# Patient Record
Sex: Male | Born: 1955 | Race: White | Hispanic: No | Marital: Single | Smoking: Current every day smoker
Health system: Southern US, Community
[De-identification: ages and names within clinical notes are randomized; demographics above are authoritative.]

## PROBLEM LIST (undated history)

## (undated) DIAGNOSIS — I10 Essential (primary) hypertension: Secondary | ICD-10-CM

## (undated) DIAGNOSIS — E559 Vitamin D deficiency, unspecified: Secondary | ICD-10-CM

## (undated) DIAGNOSIS — L8990 Pressure ulcer of unspecified site, unspecified stage: Secondary | ICD-10-CM

## (undated) DIAGNOSIS — J869 Pyothorax without fistula: Secondary | ICD-10-CM

## (undated) DIAGNOSIS — J449 Chronic obstructive pulmonary disease, unspecified: Secondary | ICD-10-CM

## (undated) DIAGNOSIS — M545 Low back pain, unspecified: Secondary | ICD-10-CM

## (undated) DIAGNOSIS — E7800 Pure hypercholesterolemia, unspecified: Secondary | ICD-10-CM

## (undated) DIAGNOSIS — Z79891 Long term (current) use of opiate analgesic: Secondary | ICD-10-CM

## (undated) DIAGNOSIS — E0590 Thyrotoxicosis, unspecified without thyrotoxic crisis or storm: Secondary | ICD-10-CM

## (undated) DIAGNOSIS — E785 Hyperlipidemia, unspecified: Secondary | ICD-10-CM

## (undated) HISTORY — PX: KNEE ARTHROSCOPY: SUR90

## (undated) HISTORY — DX: Hyperlipidemia, unspecified: E78.5

## (undated) HISTORY — PX: HERNIA REPAIR: SHX51

## (undated) HISTORY — DX: Low back pain: M54.5

## (undated) HISTORY — DX: Essential (primary) hypertension: I10

## (undated) HISTORY — DX: Vitamin D deficiency, unspecified: E55.9

## (undated) HISTORY — DX: Thyrotoxicosis, unspecified without thyrotoxic crisis or storm: E05.90

## (undated) HISTORY — DX: Pure hypercholesterolemia, unspecified: E78.00

## (undated) HISTORY — DX: Low back pain, unspecified: M54.50

## (undated) HISTORY — PX: NECK SURGERY: SHX720

## (undated) HISTORY — DX: Long term (current) use of opiate analgesic: Z79.891

---
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
## 2003-12-16 ENCOUNTER — Emergency Department (HOSPITAL_COMMUNITY): Admission: EM | Admit: 2003-12-16 | Discharge: 2003-12-16 | Payer: Self-pay | Admitting: Emergency Medicine

## 2005-07-17 ENCOUNTER — Ambulatory Visit (HOSPITAL_COMMUNITY): Admission: RE | Admit: 2005-07-17 | Discharge: 2005-07-17 | Payer: Self-pay | Admitting: Neurosurgery

## 2005-07-28 ENCOUNTER — Observation Stay (HOSPITAL_COMMUNITY): Admission: AD | Admit: 2005-07-28 | Discharge: 2005-07-29 | Payer: Self-pay | Admitting: Cardiology

## 2005-07-30 ENCOUNTER — Ambulatory Visit (HOSPITAL_COMMUNITY): Admission: RE | Admit: 2005-07-30 | Discharge: 2005-07-31 | Payer: Self-pay | Admitting: Neurosurgery

## 2006-01-29 ENCOUNTER — Encounter: Admission: RE | Admit: 2006-01-29 | Discharge: 2006-01-29 | Payer: Self-pay | Admitting: Neurosurgery

## 2006-03-04 ENCOUNTER — Inpatient Hospital Stay (HOSPITAL_COMMUNITY): Admission: RE | Admit: 2006-03-04 | Discharge: 2006-03-05 | Payer: Self-pay | Admitting: Neurosurgery

## 2007-08-27 IMAGING — RF DG MYELOGRAM CERVICAL
8 series · 8 of 8 positions shown · IV contrast (omnipaque)
Comparison: None.
COMPARISON: None.

CLINICAL DATA: Neck pain. Cervical spondylosis.  Bilateral shoulder and arm pain.
CERVICAL MYELOGRAM:
TECHNIQUE: Lumbar puncture and injection of Omnipaque contrast was performed by Dr. Juaquin Buehler. I performed the myelogram and spot images of the cervical spine.
TECHNIQUE: Multidetector CT imaging of the cervical spine was performed after intrathecal injection of contrast.  Multiplanar CT image reconstructions were also generated.

[Series 1: run · 1 of 1 slices shown (1 of 8)]
[im 1/1]
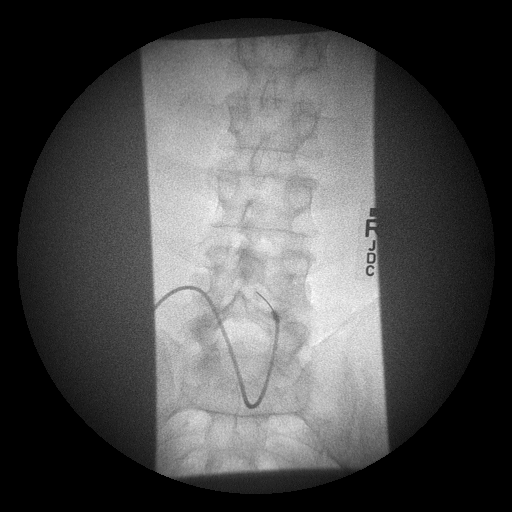

[Series 2: run · 1 of 1 slices shown (2 of 8)]
[im 1/1]
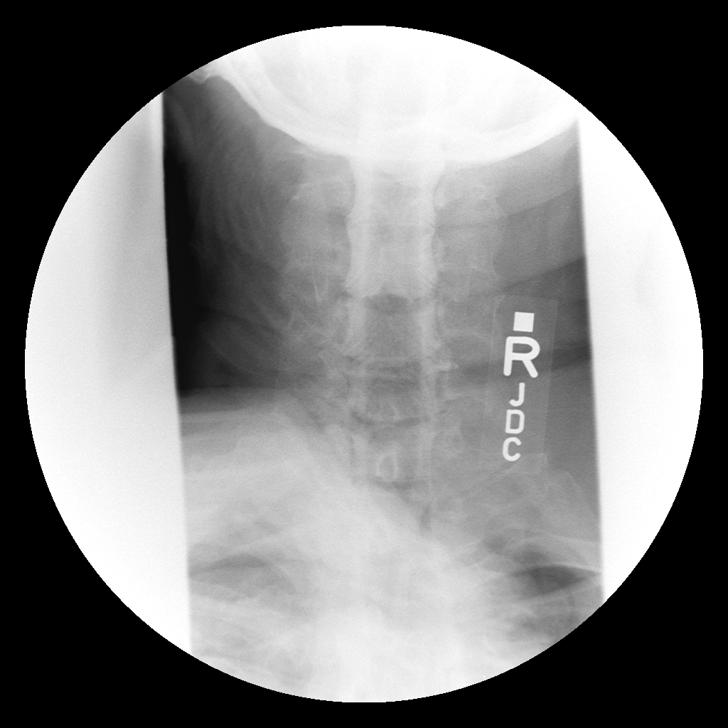

[Series 3: run · 1 of 1 slices shown (3 of 8)]
[im 1/1]
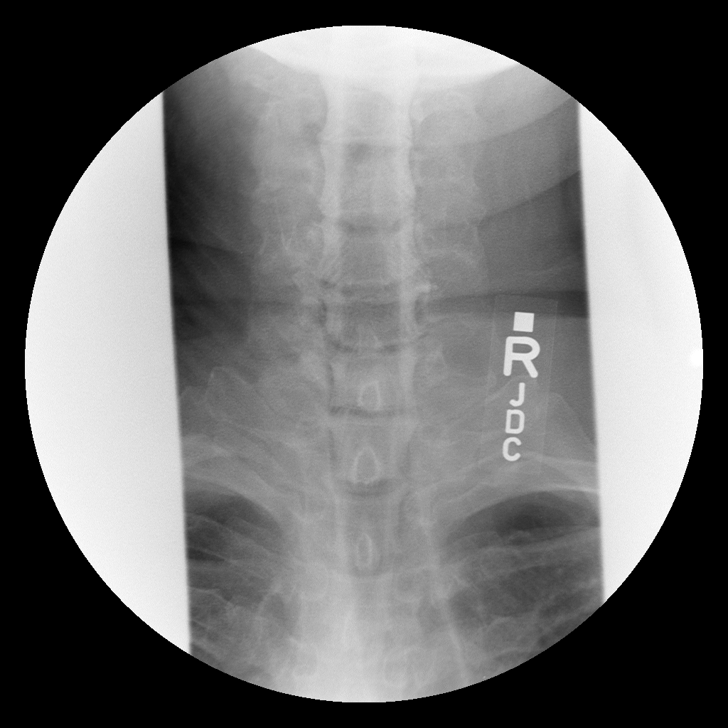

[Series 4: run · 1 of 1 slices shown (4 of 8)]
[im 1/1]
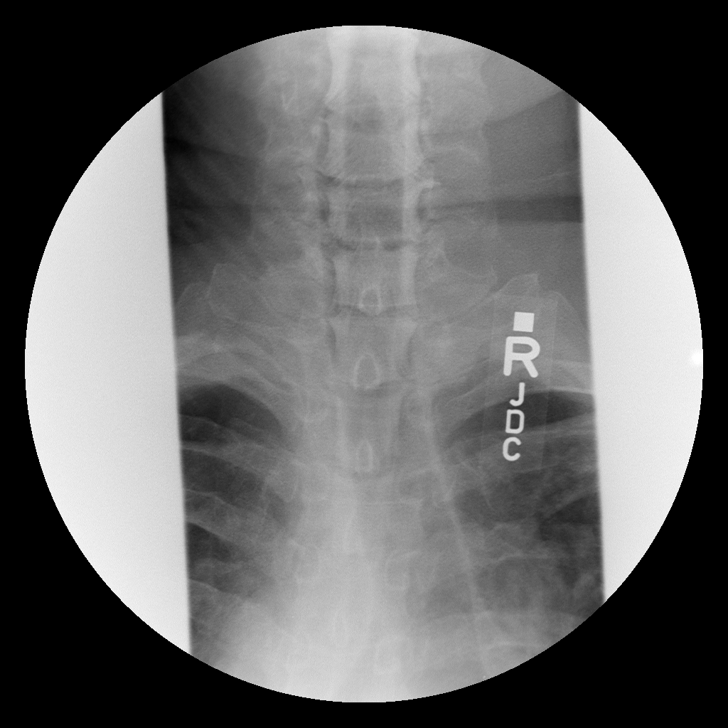

[Series 5: run · 1 of 1 slices shown (5 of 8)]
[im 1/1]
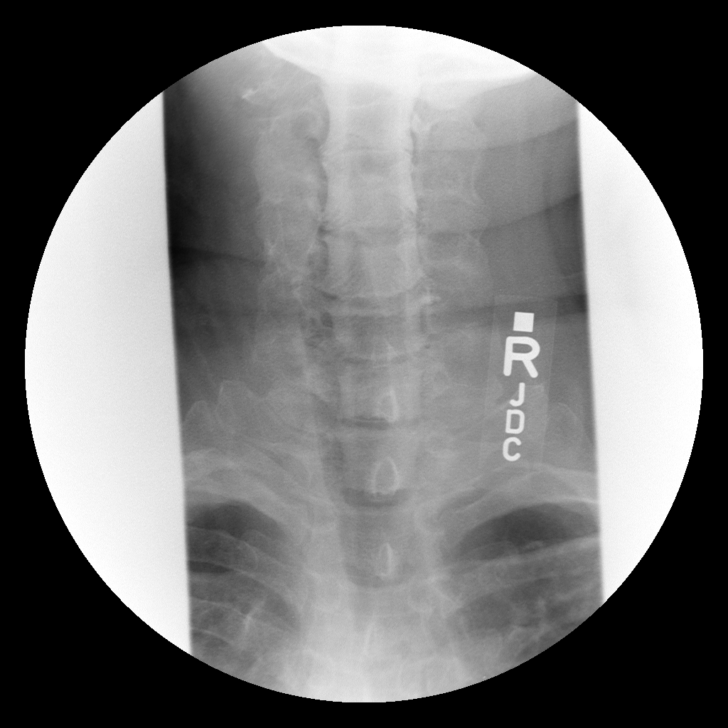

[Series 6: run · 1 of 1 slices shown (6 of 8)]
[im 1/1]
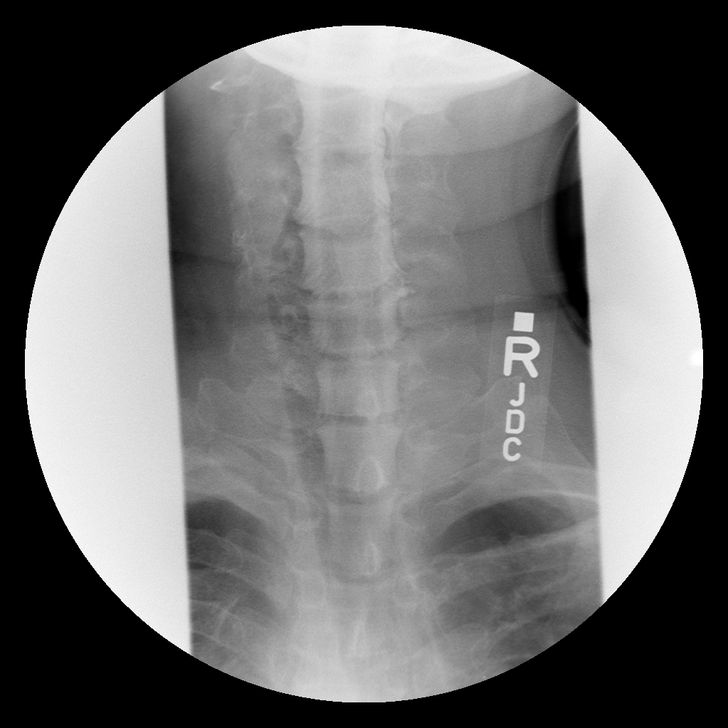

[Series 7: run · 1 of 1 slices shown (7 of 8)]
[im 1/1]
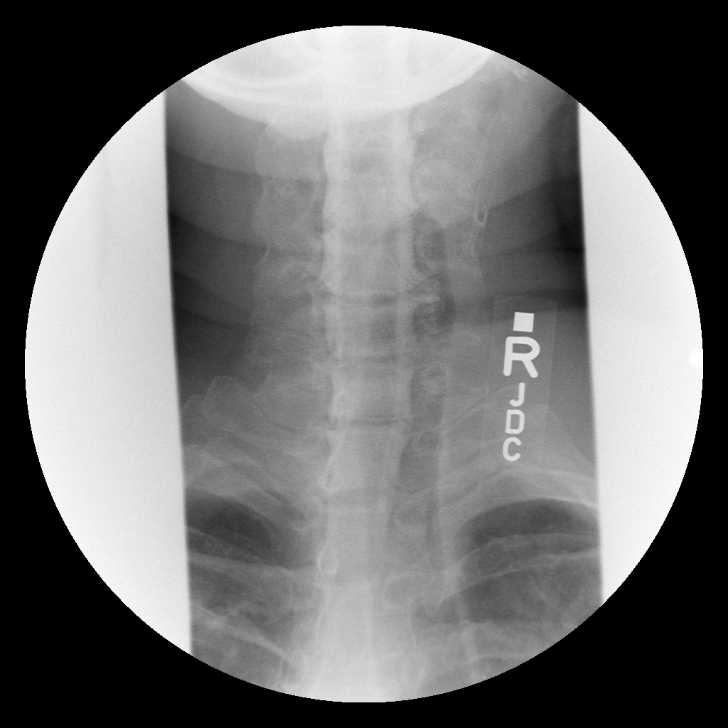

[Series 8: run · 1 of 1 slices shown (8 of 8)]
[im 1/1]
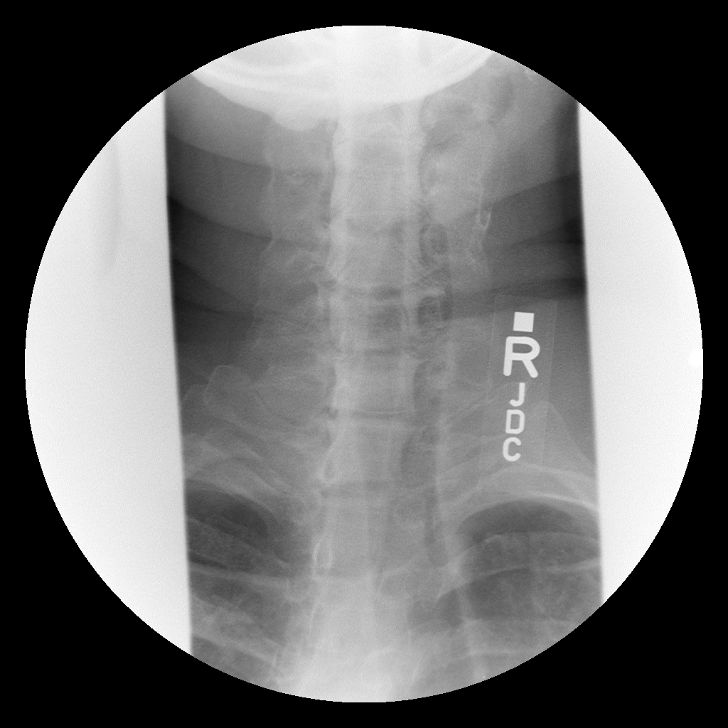

[8 of 8 positions shown; findings below may reference images not displayed]

FINDINGS: There is poor filling of the left C-6 and C-7 nerve roots.  There is incomplete filling of the right C-5 and C-7 nerve roots.  There is no significant spinal stenosis.  Disk degeneration and spondylosis are present at C4-5, C5-6 and C6-7.
IMPRESSION: Cervical spondylosis.
CT CERVICAL SPINE WITH CONTRAST (POST-MYELOGRAM):
FINDINGS: There is straightening of the cervical spine with mild kyphosis present.  No fracture or mass is identified.
C2-3:  There is a moderately large uncovertebral spur on the right without cord deformity.  This is contributing to right foraminal encroachment.
C3-4:  There is mild disk bulging and moderate facet arthropathy.  There is no significant spinal stenosis.
C4-5:  Small central disk protrusion.  This extends bilaterally especially on the right and may go into the right foramen, possibly effecting the right C-5 nerve root.  It is difficult to see if there is disk protrusion in the foramen. There is partial filling of the right C-5 nerve root. There is no cord deformity.
C5-6:  Moderate spondylitic change is present with diffuse uncovertebral spurring. There is biforaminal narrowing, left greater than right.  The canal measures 11.5 mm in diameter.
C6-7:  Spondylitic changes present with biforaminal narrowing due to spurring.
IMPRESSION: 1.  Moderately large osteophyte on the right at C2-3 with right foraminal encroachment.
2.  Central disk protrusion possibly extending into the right foramen at C4-5.
3.  Spondylitic change with biforaminal narrowing at C5-6 and C6-7.

## 2007-12-23 ENCOUNTER — Ambulatory Visit (HOSPITAL_COMMUNITY): Admission: RE | Admit: 2007-12-23 | Discharge: 2007-12-23 | Payer: Self-pay | Admitting: Neurosurgery

## 2008-11-02 ENCOUNTER — Ambulatory Visit (HOSPITAL_COMMUNITY): Admission: RE | Admit: 2008-11-02 | Discharge: 2008-11-02 | Payer: Self-pay | Admitting: Neurosurgery

## 2010-11-21 NOTE — Op Note (Signed)
NAME:  Randy Page, Randy Page NO.:  [id number]   MEDICAL RECORD NO.:  [id number]          PATIENT TYPE:  INP   LOCATION:  3041                         FACILITY:  [facility]   PHYSICIAN:  Cristi Loron, M.D.DATE OF BIRTH:  [birth year]   DATE OF PROCEDURE:  03/04/2006  DATE OF DISCHARGE:                                 OPERATIVE REPORT   BRIEF HISTORY:  The patient is a 55 year old white male who I performed a C4-  5, 5-6 and 6-7 anterior cervical diskectomy and fusion and plating on back  in January of 2007.  The patient has had persistent neck pain and appears to  be developing a pseudoarthrosis radiographically.  I discussed the various  treatment options with the patient and the patient has weighed the risks,  benefits and alternatives to surgery and decided to proceed with a posterior  cervical instrumentation and fusion.   PREOPERATIVE DIAGNOSIS:  Cervical pseudoarthrosis.   POSTOPERATIVE DIAGNOSIS:  Cervical pseudoarthrosis.   OPERATION PERFORMED:  C4-5, 5-6 and 6-7 posterior cervical fusion utilizing  bone morphogenic protein as well as Vitoss bone graft extender; posterior  cervical segmental instrumentation C4 to C7 bilaterally with Axis titanium  lateral mass screws and rods.   SURGEON:  Cristi Loron, M.D.   ASSISTANT:  Clydene Fake, M.D.   ANESTHESIA:  General endotracheal.   ESTIMATED BLOOD LOSS:  50 mL.   SPECIMENS:  None.   DRAINS:  None.   COMPLICATIONS:  None.   DESCRIPTION OF PROCEDURE:  The patient was brought to the operating room by  the anesthesia team.  General endotracheal anesthesia was induced.  I then  applied a Mayfield three point head rest to patient's calvarium and he was  then carefully turned to prone position on chest rolls.  His suboccipital  region was then shaved and this as well as the neck and upper thorax was  prepared with Betadine scrub and Betadine solution and sterile drapes were  applied.  I then injected  the area to be incised with Marcaine with  epinephrine solution.  I used a scalpel to make a linear midline incision  over cervical 5-6 junction.  I used electrocautery to perform a  subperiosteal dissection exposing the spinous process and lamina of C3, 4,  5, 6, and 7 and 1.  I then obtained an intraoperative radiograph to confirm  our location and then inserted the cerebellar retractor for exposure.  Then  under fluoroscopic guidance I used the awl to make a pilot hole in the  center of the lateral masses at C4, 5, 6, and 7 bilaterally.  Then under  fluoroscopic guidance, I drilled a 14 mm hole in the lateral masses aiming  in cephalad and lateral direction and then tapped the hole and then inserted  a 14 mm lateral mass screw in the lateral masses bilaterally at C4, 5, 6,  and 7.  I then cut the appropriate length rod, selected unilateral screws,  tightened and secured the rod in place with the capsule which was tightened  appropriately completing the instrumentation.   I then used  the high speed drill to decorticate the facets and lateral  masses at C4, 5, 6 and 7 bilaterally.  I then laid combination of bone  morphogenic protein and Vitoss bone graft extender over these decorticated  posterolateral structures completing the arthrodesis.  I then obtained  hemostasis with bipolar electrocautery and then removed the cerebellar  tractors.  We then reapproximated the patient's cervical thoracic fascia  with interrupted #1 Vicryl sutures, subcutaneous tissues with interrupted 2-  0 Vicryl suture and the skin with Steri-Strips and benzoin.  The wound was  then coated with bacitracin ointment.  A sterile dressing was applied.  The  drapes were removed.  The patient was subsequently returned to supine  position and the Mayfield three point head rest was removed from his  calvarium and he was extubated by the anesthesia team and transported to the  post anesthesia care unit in stable  condition.  All sponge, needle and  instrument counts were correct at the end of this case.      Cristi Loron, M.D.  Electronically Signed     JDJ/MEDQ  D:  03/05/2006  T:  03/05/2006  Job:  [id number]

## 2010-11-21 NOTE — Op Note (Signed)
NAME:  Randy Page, Randy Page NO.:  [id number]   MEDICAL RECORD NO.:  [id number]          PATIENT TYPE:  INP   LOCATION:  3041                         FACILITY:  [facility]   PHYSICIAN:  Cristi Loron, M.D.DATE OF BIRTH:  Nov 21, 1955   DATE OF PROCEDURE:  07/30/2005  DATE OF DISCHARGE:  07/31/2005                                 OPERATIVE REPORT   PREOPERATIVE DIAGNOSES:  1.  Cervical 4-5, cervical 5-6 and cervical 6-7 degenerative disk disease,  2.  Spondylosis,  3.  Stenosis,  4.  Cervical radiculopathy; and,  5.  Cervicalgia.   POSTOPERATIVE DIAGNOSES:  1.  Cervical 4-5, cervical 5-6 and cervical 6-7 degenerative disk disease,  2.  Spondylosis,  3.  Stenosis,  4.  Cervical radiculopathy; and,  5.  Cervicalgia.   OPERATION PERFORMED:  1.  Cervical 4-5, cervical 5-6 and cervical 6-7 extensive anterior cervical      diskectomies, interbody iliac crest allograft and arthrodesis.  2.  Anterior cervical plating (Codman Slim Lock Titanium plate and screws).   SURGEON:  Cristi Loron, M.D.   ASSISTANT:  Hewitt Shorts, M.D.   ANESTHESIA:  General endotracheal.   ESTIMATED BLOOD LOSS:  The estimated blood loss was 200 mL.   SPECIMENS:  None.   DRAINS:  None.   COMPLICATIONS:  None.   BRIEF HISTORY:  The patient is a 55 year old white male who has suffered  from chronic neck and arm pain.  He failed medical management.  He was  worked up with a cervical MRI and a cervical myelo CT, which demonstrated  degenerative disk disease, spondylosis and stenosis at C4, 5, 5-6 and 6-7.  I discussed the various treatment options with the patient including  surgery.  The patient has weighed the risks, benefits and alternatives to  surgery, and decided to proceed with a C4,5, 5-6 and 6-7 anterior cervical  diskectomy with fusion and plating.   DESCRIPTION OF THE OPERATION:  The patient was brought to the operating room  by the anesthesia team.  General  endotracheal anesthesia was induced. The  patient remained in the supine position.  A roll was placed under the  shoulders.  We placed his neck in slight extension.  Th anterior cervical  region was then prepared with Betadine scrub and Betadine solution, and  sterile drapes were applied.  I then injected the area to be incised with  Marcaine with epinephrine solution.   I used a scalpel to make a transverse incision in the patient's left  anterior neck.  I used the Metzenbaum scissors to divide the platysmal  muscle and then to dissected medial to the sternocleidomastoid muscle,  jugular vein and carotid artery.  I carefully dissected down to the anterior  cervical spine.  I identified the esophagus and retracted it medially.  We  then cleared the soft tissue from the anterior cervical spine using Kitner  swabs, and then inserted a bent spinal needle at the upper exposing the  intervertebral disk space.  We then obtained intraoperative radiographs to  confirm our location.   We then used electrocautery  to detach the medial border off the longus colli  muscle bilaterally from the C4-5, 5-6 and 6-7 intervertebral disk spaces.  We inserted a Caspar self-retaining retractor for exposure.  We began at C4-  5.  We incised the C4-5 intervertebral disk with a 15 blade scalpel.  It was  quite spondylotic.  We then performed a partial diskectomy pituitary forceps  and karlin  curets.  We then inserted distraction screws at C4 and C5, and  distracted the interspace.  We then brought the operative microscope into  the field, and under loop magnification and illumination we completed the  decompression.  We used the high speed drill to decorticate the vertebral  endplates at C4-5, drilling away the remainder of the C4-5 intervertebral  disk, and drilled away some posterior spondylosis and thinning ou the  posterior longitudinal ligament.  We incised the thinned out ligament with  the arachnoid knife  and then removed it with the Kerrison punch,  undercutting the vertebral endplates, decompressing the thecal sac.  We then  performed a generous foraminotomy about the bilateral C5 nerve roots  completing the decompression at this level.   We then repeated this procedure in an analogous fashion at C5-6 and C6-7  completing the decompression at these levels, decompressing the thecal sac  and the bilateral C6 and C7 nerve roots.  There was considerable spondylosis  at both levels.   We now turned our attention to the arthrodesis.  We obtained iliac crest  tricortical allograft bone graft and then fashioned them in the following  dimensions, 6 mm in height and 1 cm in depth.  We inserted the bone graft  into the distracted C4-5, C5-6 and C6-7 interspaces.  We then removed the  distraction screws.  There was a good snug fit of the bone graft at each  level.   We now turned our attention to anterior spinal instrumentation.  We obtained  the appropriate length codman anterior cervical plate.  We then used the  high speed drill to remove some ventral spondylosis from the vertebral  endplates at C4-5, C5-6 and C6-7 so that the plate would lie down flat.  We  laid the plate along the anterior aspect of the vertebral bodies from C4 to  C7.  We drilled two 14 mm holes at C4, 5, 6 and 7.  We then secured the  plate to the vertebral body, placing to 14 mm tapping screws at C4, 5, 6 and  7.  We then obtained intraoperative radiograph.  We could see the upper  plate and screws adequately, but the lower plate and screws we could not see  well on the x-ray because of the patient's body habitus; however, they  looked good in vivo.  We therefore secured the screws to the plate by  locking its cam, completing the instrumentation.   We then irrigated the wound out with bacitracin solution.  We removed the  solution.  We then obtained stringent hemostasis using bipolar electrocautery.  We then removed the  Caspar retractor and inspected his  esophagus for any damage; and, there was none apparent.  We then  reapproximated the patient's platysma muscle with interrupted 3-0 Vicryl  suture, the subcutaneous tissue with interrupted 3-0 Vicryl suture and the  skin with Steri-strips and benzoin.  The wound was then coated with  bacitracin ointment.  A sterile dressing was applied.  The drapes were  removed.   The patient was subsequently extubated by the anesthesia team and  transported to the post-anesthesia care unit in stable condition.   COUNTS:  All sponge, instrument and needle counts were correct at the end of  this case.      Cristi Loron, M.D.  Electronically Signed     JDJ/MEDQ  D:  08/01/2005  T:  08/02/2005  Job:  [id number]

## 2010-11-21 NOTE — Consult Note (Signed)
NAME:  Randy Page, MAHRT NO.:  [id number]   MEDICAL RECORD NO.:  [id number]           PATIENT TYPE:   LOCATION:                                 FACILITY:   PHYSICIAN:  Armanda Magic, M.D.          DATE OF BIRTH:   DATE OF CONSULTATION:  DATE OF DISCHARGE:                                   CONSULTATION   PRIMARY CARE PHYSICIAN:  Feliciana Rossetti, MD in [place].   CHIEF COMPLAINT:  Abnormal EKG.   HISTORY OF PRESENT ILLNESS:  This is an extremely pleasant 55 year old white  male with a history of tobacco use and a history of intermittent chest pain  in the past who presented for preop clearance for cervical disk surgery  which is scheduled for July 30, 2005.  Apparently during this presurgery  workup he was found to have an abnormal EKG with a slight ST elevation in  lead I as well as J-point elevation in V2.  We are now asked by Dr. Lovell Sheehan,  to consult in regards to the abnormal EKG.  His EKG on review shows sinus  rhythm with a rate of 63 beats/minute with J-point elevated in V2, also some  slight ST elevation in lead I which is most consistent with early  repolarization.  Apparently he was seen.  In  [facility] in  1999 because of left chest and shoulder pain and cardiac workup was done  which apparently was normal; and he was told that it was because of his  smoking.  Since then he has had off and on chest pain, he says occasionally  when he is driving to and from work.  after he has lit a cigarette, he will  get chest pressure across his chest.  It is transient with some shortness of  breath, but it does not last very long and there is no radiation of the  chest pain into his neck or shoulders.  He does have a lot of cervical neck  pain for which he is having surgery.  His cardiac risk factors include  tobacco use and hypertension.  No family history of heart disease.  Today,  prior to the EKG he did complain of some left arm pain, but it  was  reproducible by palpation of his upper extremity.   PAST MEDICAL HISTORY:  Includes left carpal tunnel syndrome, status post  arthroscopic knee surgery and a herniorrhaphy   FAMILY HISTORY:  His mother is 76 with hypertension and arthritis.  Father  is 35.  He had a history of an MI and hypertension.   SOCIAL HISTORY:  He is single and lives in [place].  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  He quit alcohol 4 years ago.  He smokes 1-1/2 pack of cigarettes per day and  denies any illicit drug use.   ALLERGIES:  None.   MEDICATIONS:  1.  Atenolol 50 mg 1/2 tab a day  2.  Ibuprofen 600 mg t.i.d.  3.  Ultram 50 mg 1 or 2 q.4 h. P.r.n. for pain.  REVIEW OF SYSTEMS:  His review of systems other than what is in the HPI is  negative.   PHYSICAL EXAMINATION:  VITAL SIGNS:  Blood pressure 134/76, pulse 70,  respirations 16.  He is afebrile.  O2 saturation is 97% on room air.  HEENT:  Benign.  NECK:  Supple without lymphadenopathy.  Carotid upstrokes +2 bilaterally, no  bruits.  LUNGS:  Clear to auscultation throughout.  HEART:  Regular rate and rhythm no murmurs, rubs, or gallops.  Normal S1-S2.  ABDOMEN:  Soft, nontender, nondistended, normoactive bowel sounds.  No  hepatosplenomegaly.  EXTREMITIES:  No edema.  No femoral bruits.  NEUROLOGIC:  He is alert and oriented x3 with cranial nerves grossly intact.   LABS:  Sodium 140, potassium 4.3, chloride 106, bicarb 29, BUN 8, creatinine  1.0, glucose 94.  White cell count 8.4, hemoglobin 15, hematocrit 42.6, and  platelet count 209.  Chest x-ray shows no active disease. EKG shows sinus  rhythm with J-point elevation of 2 mm in V-2 which is most likely a normal  variant.  There is also some slight ST elevation in one of the EKGs done at  1329, today, in lead I but most likely consistent with early repolarization.   ASSESSMENT:  1.  Abnormal EKG with J-point elevation in V2 only and some slight elevation      in lead 1, most likely early  repolarization.  This does not represent an      acute myocardial infarction.  The patient is completely asymptomatic      without any chest pain, at present, but has had some problems with      intermittent chest pain in the past, mainly nonexertional.  He does have      multiple cardiac risk factors including a family history, his      significant tobacco abuse, his age, sex, hypertension.  His lipid status      is unknown.  2.  Hypertension.  3.  Family history of coronary disease.  4.  Cervical stenosis awaiting surgery on July 30, 2005.   PLAN:  Would recommend admitting to the hospital given his episodes of chest  pain and getting a stress test prior to surgery.  Of note, there are no  available outpatient Cardiolite slots, and we have no slots available in our  office, and the only way to get this patient's stress test done tomorrow  would be to go ahead and admit him for chest pain today, which we will do.  We will check a set of cardiac enzymes on him, and get him setup for stress  Cardiolite study in the morning.  If this is normal, with no inducible  ischemia, then he will be okay to undergo his cervical fusion on the 25th.  We will also check a 2-D echocardiogram to evaluate for structural heart  disease.  We will also check a fasting statin panel.      Armanda Magic, M.D.  Electronically Signed     TT/MEDQ  D:  07/28/2005  T:  07/29/2005  Job:  [id number]   cc:   Cristi Loron, M.D.  Fax: [phone]   Feliciana Rossetti, MD  Fax: [phone]

## 2010-11-21 NOTE — Discharge Summary (Signed)
NAME:  Randy Page, VANDERPOL                ACCOUNT NO.:  [id number]   MEDICAL RECORD NO.:  [id number]          PATIENT TYPE:  INP   LOCATION:  2001                         FACILITY:  [facility]   PHYSICIAN:  Armanda Magic, M.D.     DATE OF BIRTH:  [birth year]   DATE OF ADMISSION:  07/28/2005  DATE OF DISCHARGE:  07/29/2005                                 DISCHARGE SUMMARY   DISCHARGE DIAGNOSES:  1.  Abnormal electrocardiogram.  2.  Hypertension.  3.  Previous atypical chest pain, currently pain-free.  4.  Family history of coronary artery disease.  5.  Cervical stenosis for surgery on July 30, 2005 with Dr. Lovell Sheehan.   HOSPITAL COURSE:  Mr. Schupp is a 55 year old male patient who was in the  preadmission center on July 28, 2005 for preoperative labs and  electrocardiogram.  He was found to have an abnormal electrocardiogram and  we were asked to see the patient.  His electrocardiogram showed normal sinus  rhythm, rate 60 with J point elevation in lead V2.  He stated he had some  chest pain in 1999 and went to [facility] but in general  the pain was felt to be noncardiac.  He does admit to having some atypical  transient chest pain in the past that was nonexertional.   We did admit the patient overnight and he did undergo a stress Cardiolite.  There were no signs of inducible ischemia. His ejection fraction was 50% and  we felt there was no need for further cardiac work up.  Dr. Lovell Sheehan' office  was notified, [place], and the patient was discharged to home in stable  condition.   During his hospital stay the patient had a total cholesterol panel of 176,  triglycerides 313, HDL 31, LDL 82.  The patient also had a 2-dimensional  echocardiogram that showed normal left ventricular function with ejection  fraction of 60% with no wall motion abnormalities.  At this point the  patient does not need any further cardiac work up and is being cleared for  surgery by Dr. Armanda Magic.      Guy Franco, P.A.      Armanda Magic, M.D.  Electronically Signed    LB/MEDQ  D:  09/10/2005  T:  09/10/2005  Job:  [id number]   cc:   Feliciana Rossetti, MD  Fax: [phone]   Cristi Loron, M.D.  Fax: [phone]

## 2017-03-30 ENCOUNTER — Ambulatory Visit: Payer: Self-pay | Admitting: Podiatry

## 2017-09-15 ENCOUNTER — Encounter: Payer: Self-pay | Admitting: Cardiology

## 2017-09-15 ENCOUNTER — Ambulatory Visit (INDEPENDENT_AMBULATORY_CARE_PROVIDER_SITE_OTHER): Payer: Medicare Other | Admitting: Cardiology

## 2017-09-15 VITALS — BP 130/80 | HR 68 | Ht 70.0 in | Wt 173.0 lb

## 2017-09-15 DIAGNOSIS — I6523 Occlusion and stenosis of bilateral carotid arteries: Secondary | ICD-10-CM

## 2017-09-15 DIAGNOSIS — F17210 Nicotine dependence, cigarettes, uncomplicated: Secondary | ICD-10-CM

## 2017-09-15 DIAGNOSIS — R0602 Shortness of breath: Secondary | ICD-10-CM

## 2017-09-15 DIAGNOSIS — I6529 Occlusion and stenosis of unspecified carotid artery: Secondary | ICD-10-CM | POA: Insufficient documentation

## 2017-09-15 DIAGNOSIS — E782 Mixed hyperlipidemia: Secondary | ICD-10-CM | POA: Insufficient documentation

## 2017-09-15 DIAGNOSIS — I10 Essential (primary) hypertension: Secondary | ICD-10-CM

## 2017-09-15 DIAGNOSIS — I7090 Unspecified atherosclerosis: Secondary | ICD-10-CM | POA: Insufficient documentation

## 2017-09-15 HISTORY — DX: Unspecified atherosclerosis: I70.90

## 2017-09-15 HISTORY — DX: Occlusion and stenosis of unspecified carotid artery: I65.29

## 2017-09-15 HISTORY — DX: Essential (primary) hypertension: I10

## 2017-09-15 HISTORY — DX: Shortness of breath: R06.02

## 2017-09-15 HISTORY — DX: Nicotine dependence, cigarettes, uncomplicated: F17.210

## 2017-09-15 HISTORY — DX: Mixed hyperlipidemia: E78.2

## 2017-09-15 MED ORDER — ASPIRIN EC 81 MG PO TBEC: 81.0000 mg | DELAYED_RELEASE_TABLET | Freq: Every day | ORAL | 3 refills | Status: DC

## 2017-09-15 NOTE — Progress Notes (Signed)
Cardiology Office Note:    Date:  09/15/2017   ID:  Randy Page, DOB 15-Sep-1955, MRN [id number]  PCP:  Randy Spanish, MD  Cardiologist:  Randy Lindau, MD   Referring MD: Randy Spanish, MD    ASSESSMENT:    1. SOB (shortness of breath) on exertion   2. Essential hypertension   3. Mixed dyslipidemia   4. Cigarette smoker   5. Bilateral carotid artery stenosis   6. Atherosclerotic vascular disease    PLAN:    In order of problems listed above:  1. Secondary prevention stressed with the patient.  Importance of compliance with diet and medications stressed and he vocalized understanding.  He is on appropriate medications.  His lipids are followed by his primary care physician.  Diet was discussed. 2. I spent 5 minutes with the patient discussing solely about smoking. Smoking cessation was counseled. I suggested to the patient also different medications and pharmacological interventions. Patient is keen to try stopping on its own at this time. He will get back to me if he needs any further assistance in this matter. 3. Echocardiogram will be done to assess murmur heard on auscultation.  He has shortness of breath on exertion and this will be assessed with a Lexiscan sestamibi to see any obstructive evidence of coronary artery disease. 4. I want him to get a carotid angiography to assess his carotid artery stenosis and get a more definitive answer. 5. Patient will be seen in follow-up appointment in 6 months or earlier if the patient has any concerns    Medication Adjustments/Labs and Tests Ordered: Current medicines are reviewed at length with the patient today.  Concerns regarding medicines are outlined above.  No orders of the defined types were placed in this encounter.  No orders of the defined types were placed in this encounter.    History of Present Illness:    Randy Page is a 62 y.o. male who is being seen today for the evaluation of shortness of breath  on exertion and atherosclerotic vascular disease at the request of Randy Page, Randy Baker, MD.  Patient is a pleasant 62 year old male.  He has past medical history of essential hypertension, dyslipidemia.  He is an active smoker smokes more than a pack a day since the age of 62.  He is here and wants him to be evaluated.  I got report from his primary care physician.  Patient leads a sedentary lifestyle because of orthopedic issues dealing with the neck.  He has had evaluations like CT scans and neck evaluation with Dopplers which are abnormal.  CT scan of the chest reveals calcification of the coronary arteries.  The neck evaluation with ultrasound and Dopplers revealed stenosis in those arteries for which further evaluation has been recommended.  No chest pain orthopnea or PND.  He has shortness of breath on exertion.  He has significant smoking exposure as mentioned above.  Past Medical History:  Diagnosis Date  . Chronically on opiate therapy   . Hypercholesteremia   . Hyperlipidemia   . Hypertension   . Low back pain   . Thyrotoxicosis   . Vitamin D deficiency     History reviewed. No pertinent surgical history.  Current Medications: Current Meds  Medication Sig  . atorvastatin (LIPITOR) 20 MG tablet Take 20 mg by mouth at bedtime.  . diazepam (VALIUM) 2 MG tablet Take 2 mg by mouth at bedtime.  Marland Kitchen doxepin (SINEQUAN) 100 MG capsule Take 100 mg  by mouth at bedtime.  . gabapentin (NEURONTIN) 600 MG tablet Take 600 mg by mouth 4 (four) times daily.  . methimazole (TAPAZOLE) 5 MG tablet Take 5 mg by mouth 2 (two) times daily.  Marland Kitchen NARCAN 4 MG/0.1ML LIQD nasal spray kit USE 1 (ONE) SPRAY AS NEEDED  . oxyCODONE (ROXICODONE) 15 MG immediate release tablet Take 15 mg by mouth 4 (four) times daily.  . pantoprazole (PROTONIX) 40 MG tablet Take 40 mg by mouth daily.  Marland Kitchen PROAIR HFA 108 (90 Base) MCG/ACT inhaler INHALE 2 PUFFS BY MOUTH 3 TIMES DAILY     Allergies:   Patient has no known allergies.    Social History   Socioeconomic History  . Marital status: Single    Spouse name: None  . Number of children: None  . Years of education: None  . Highest education level: None  Social Needs  . Financial resource strain: None  . Food insecurity - worry: None  . Food insecurity - inability: None  . Transportation needs - medical: None  . Transportation needs - non-medical: None  Occupational History  . None  Tobacco Use  . Smoking status: Current Every Day Smoker  . Smokeless tobacco: Never Used  Substance and Sexual Activity  . Alcohol use: None  . Drug use: None  . Sexual activity: None  Other Topics Concern  . None  Social History Narrative  . None     Family History: The patient's family history is not on file.  ROS:   Please see the history of present illness.    All other systems reviewed and are negative.  EKGs/Labs/Other Studies Reviewed:    The following studies were reviewed today: I reviewed the results of the ABI, EKG, carotid Doppler and CT chest report revealing atherosclerotic evidence in the coronary arteries.   Recent Labs: No results found for requested labs within last 8760 hours.  Recent Lipid Panel No results found for: CHOL, TRIG, HDL, CHOLHDL, VLDL, LDLCALC, LDLDIRECT  Physical Exam:    VS:  BP 130/80 (BP Location: Left Arm, Patient Position: Sitting, Cuff Size: Normal)   Pulse 68   Ht 5' 10"  (1.778 m)   Wt 173 lb (78.5 kg)   SpO2 97%   BMI 24.82 kg/m     Wt Readings from Last 3 Encounters:  09/15/17 173 lb (78.5 kg)     GEN: Patient is in no acute distress HEENT: Normal NECK: No JVD; No carotid bruits LYMPHATICS: No lymphadenopathy CARDIAC: S1 S2 regular, 2/6 systolic murmur at the apex. RESPIRATORY:  Clear to auscultation without rales, wheezing or rhonchi  ABDOMEN: Soft, non-tender, non-distended MUSCULOSKELETAL:  No edema; No deformity  SKIN: Warm and dry NEUROLOGIC:  Alert and oriented x 3 PSYCHIATRIC:  Normal affect     Signed, Randy Lindau, MD  09/15/2017 10:08 AM    [place]

## 2017-09-15 NOTE — Patient Instructions (Signed)
Medication Instructions:  Your physician has recommended you make the following change in your medication:  START 81 mg enteric coated aspirin daily  Labwork: None  Testing/Procedures: Your physician has requested that you have a lexiscan myoview. For further information please visit [email or web address]. Please follow instruction sheet, as given.  Your physician has requested that you have an echocardiogram. Echocardiography is a painless test that uses sound waves to create images of your heart. It provides your doctor with information about the size and shape of your heart and how well your heart's chambers and valves are working. This procedure takes approximately one hour. There are no restrictions for this procedure.  Non-Cardiac CT scanning, (CAT scanning), is a noninvasive, special x-ray that produces cross-sectional images of the body using x-rays and a computer. CT scans help physicians diagnose and treat medical conditions. For some CT exams, a contrast material is used to enhance visibility in the area of the body being studied. CT scans provide greater clarity and reveal more details than regular x-ray exams.   Follow-Up: Your physician recommends that you schedule a follow-up appointment in: 6 months  Any Other Special Instructions Will Be Listed Below (If Applicable).     If you need a refill on your cardiac medications before your next appointment, please call your pharmacy.   [facility]  Garey HamAshley A, RN, BSN

## 2017-09-16 LAB — BASIC METABOLIC PANEL
BUN/Creatinine Ratio: 5 — ABNORMAL LOW (ref 10–24)
BUN: 5 mg/dL — ABNORMAL LOW (ref 8–27)
CHLORIDE: 100 mmol/L (ref 96–106)
CO2: 25 mmol/L (ref 20–29)
Calcium: 8.7 mg/dL (ref 8.6–10.2)
Creatinine, Ser: 0.91 mg/dL (ref 0.76–1.27)
GFR calc Af Amer: 104 mL/min/{1.73_m2} (ref >59)
GFR, EST NON AFRICAN AMERICAN: 90 mL/min/{1.73_m2} (ref >59)
Glucose: 102 mg/dL — ABNORMAL HIGH (ref 65–99)
POTASSIUM: 4.1 mmol/L (ref 3.5–5.2)
SODIUM: 139 mmol/L (ref 134–144)

## 2017-09-21 ENCOUNTER — Telehealth (HOSPITAL_COMMUNITY): Payer: Self-pay | Admitting: *Deleted

## 2017-09-21 NOTE — Telephone Encounter (Signed)
Patient given detailed instructions per Myocardial Perfusion Study Information Sheet for the test on 09/24/17. Patient notified to arrive 15 minutes early and that it is imperative to arrive on time for appointment to keep from having the test rescheduled.  If you need to cancel or reschedule your appointment, please call the office within 24 hours of your appointment. . Patient verbalized understanding. Randy Page [name]    

## 2017-09-24 ENCOUNTER — Encounter (HOSPITAL_COMMUNITY): Payer: Medicare Other

## 2017-10-04 ENCOUNTER — Telehealth (HOSPITAL_COMMUNITY): Payer: Self-pay | Admitting: *Deleted

## 2017-10-04 NOTE — Telephone Encounter (Signed)
Patient given detailed instructions per Myocardial Perfusion Study Information Sheet for the test on 10/07/17 at 1000. Patient notified to arrive 15 minutes early and that it is imperative to arrive on time for appointment to keep from having the test rescheduled.  If you need to cancel or reschedule your appointment, please call the office within 24 hours of your appointment. . Patient verbalized understanding.Randy Page, Adelene IdlerCynthia W

## 2017-10-07 ENCOUNTER — Ambulatory Visit (HOSPITAL_COMMUNITY): Payer: Medicare Other | Attending: Cardiology

## 2017-10-07 VITALS — Ht 70.0 in | Wt 173.0 lb

## 2017-10-07 DIAGNOSIS — I10 Essential (primary) hypertension: Secondary | ICD-10-CM | POA: Diagnosis not present

## 2017-10-07 DIAGNOSIS — R0602 Shortness of breath: Secondary | ICD-10-CM | POA: Insufficient documentation

## 2017-10-07 DIAGNOSIS — I519 Heart disease, unspecified: Secondary | ICD-10-CM | POA: Insufficient documentation

## 2017-10-07 LAB — MYOCARDIAL PERFUSION IMAGING
CHL CUP RESTING HR STRESS: 57 {beats}/min
LHR: 0.31
LV sys vol: 44 mL
LVDIAVOL: 111 mL (ref 62–150)
NUC STRESS TID: 1.21
Peak HR: 76 {beats}/min
SDS: 2
SRS: 8
SSS: 10

## 2017-10-07 MED ORDER — TECHNETIUM TC 99M TETROFOSMIN IV KIT
10.6000 | PACK | Freq: Once | INTRAVENOUS | Status: AC | PRN
  Administered 2017-10-07: 10.6 via INTRAVENOUS
  Filled 2017-10-07: qty 11

## 2017-10-07 MED ORDER — REGADENOSON 0.4 MG/5ML IV SOLN
0.4000 mg | Freq: Once | INTRAVENOUS | Status: AC
  Administered 2017-10-07: 0.4 mg via INTRAVENOUS

## 2017-10-07 MED ORDER — TECHNETIUM TC 99M TETROFOSMIN IV KIT
31.8000 | PACK | Freq: Once | INTRAVENOUS | Status: AC | PRN
  Administered 2017-10-07: 31.8 via INTRAVENOUS
  Filled 2017-10-07: qty 32

## 2017-10-08 ENCOUNTER — Telehealth: Payer: Self-pay | Admitting: *Deleted

## 2017-10-08 NOTE — Telephone Encounter (Signed)
Pt calling back about results of lexiscan. Informed pt results were normal and Dr. Tomie Chinaevankar would go over in detail at next visit. Pt stated he would see us at next visit.

## 2017-10-12 ENCOUNTER — Ambulatory Visit (HOSPITAL_BASED_OUTPATIENT_CLINIC_OR_DEPARTMENT_OTHER): Payer: Medicare Other

## 2017-10-12 ENCOUNTER — Ambulatory Visit (HOSPITAL_BASED_OUTPATIENT_CLINIC_OR_DEPARTMENT_OTHER): Admission: RE | Admit: 2017-10-12 | Payer: Medicare Other | Source: Ambulatory Visit

## 2017-11-02 ENCOUNTER — Other Ambulatory Visit: Payer: Self-pay

## 2017-11-02 DIAGNOSIS — I10 Essential (primary) hypertension: Secondary | ICD-10-CM

## 2017-11-02 DIAGNOSIS — R0602 Shortness of breath: Secondary | ICD-10-CM

## 2017-11-02 DIAGNOSIS — I6529 Occlusion and stenosis of unspecified carotid artery: Secondary | ICD-10-CM

## 2017-11-03 ENCOUNTER — Ambulatory Visit (HOSPITAL_BASED_OUTPATIENT_CLINIC_OR_DEPARTMENT_OTHER): Admission: RE | Admit: 2017-11-03 | Payer: Medicare Other | Source: Ambulatory Visit

## 2017-11-03 ENCOUNTER — Other Ambulatory Visit (HOSPITAL_BASED_OUTPATIENT_CLINIC_OR_DEPARTMENT_OTHER): Payer: Medicare Other

## 2018-10-31 DIAGNOSIS — F1190 Opioid use, unspecified, uncomplicated: Secondary | ICD-10-CM

## 2018-10-31 DIAGNOSIS — T68XXXA Hypothermia, initial encounter: Secondary | ICD-10-CM

## 2018-10-31 DIAGNOSIS — D649 Anemia, unspecified: Secondary | ICD-10-CM

## 2018-10-31 DIAGNOSIS — D500 Iron deficiency anemia secondary to blood loss (chronic): Secondary | ICD-10-CM

## 2018-10-31 DIAGNOSIS — Z7902 Long term (current) use of antithrombotics/antiplatelets: Secondary | ICD-10-CM

## 2018-10-31 DIAGNOSIS — K922 Gastrointestinal hemorrhage, unspecified: Secondary | ICD-10-CM

## 2018-10-31 DIAGNOSIS — K269 Duodenal ulcer, unspecified as acute or chronic, without hemorrhage or perforation: Secondary | ICD-10-CM

## 2018-11-01 DIAGNOSIS — F1190 Opioid use, unspecified, uncomplicated: Secondary | ICD-10-CM | POA: Diagnosis not present

## 2018-11-01 DIAGNOSIS — K922 Gastrointestinal hemorrhage, unspecified: Secondary | ICD-10-CM | POA: Diagnosis not present

## 2018-11-01 DIAGNOSIS — D500 Iron deficiency anemia secondary to blood loss (chronic): Secondary | ICD-10-CM | POA: Diagnosis not present

## 2018-11-01 DIAGNOSIS — K269 Duodenal ulcer, unspecified as acute or chronic, without hemorrhage or perforation: Secondary | ICD-10-CM | POA: Diagnosis not present

## 2018-11-02 DIAGNOSIS — F1190 Opioid use, unspecified, uncomplicated: Secondary | ICD-10-CM | POA: Diagnosis not present

## 2018-11-02 DIAGNOSIS — K922 Gastrointestinal hemorrhage, unspecified: Secondary | ICD-10-CM | POA: Diagnosis not present

## 2018-11-02 DIAGNOSIS — D500 Iron deficiency anemia secondary to blood loss (chronic): Secondary | ICD-10-CM | POA: Diagnosis not present

## 2018-11-02 DIAGNOSIS — K269 Duodenal ulcer, unspecified as acute or chronic, without hemorrhage or perforation: Secondary | ICD-10-CM | POA: Diagnosis not present

## 2018-11-03 DIAGNOSIS — K269 Duodenal ulcer, unspecified as acute or chronic, without hemorrhage or perforation: Secondary | ICD-10-CM | POA: Diagnosis not present

## 2018-11-03 DIAGNOSIS — D500 Iron deficiency anemia secondary to blood loss (chronic): Secondary | ICD-10-CM | POA: Diagnosis not present

## 2018-11-03 DIAGNOSIS — K922 Gastrointestinal hemorrhage, unspecified: Secondary | ICD-10-CM | POA: Diagnosis not present

## 2018-11-03 DIAGNOSIS — F1190 Opioid use, unspecified, uncomplicated: Secondary | ICD-10-CM | POA: Diagnosis not present

## 2018-11-28 DIAGNOSIS — R531 Weakness: Secondary | ICD-10-CM

## 2018-11-28 DIAGNOSIS — I10 Essential (primary) hypertension: Secondary | ICD-10-CM

## 2018-11-28 DIAGNOSIS — E8809 Other disorders of plasma-protein metabolism, not elsewhere classified: Secondary | ICD-10-CM

## 2018-11-28 DIAGNOSIS — L8990 Pressure ulcer of unspecified site, unspecified stage: Secondary | ICD-10-CM | POA: Diagnosis not present

## 2018-11-28 DIAGNOSIS — M549 Dorsalgia, unspecified: Secondary | ICD-10-CM

## 2018-11-28 DIAGNOSIS — D619 Aplastic anemia, unspecified: Secondary | ICD-10-CM

## 2018-11-28 DIAGNOSIS — E162 Hypoglycemia, unspecified: Secondary | ICD-10-CM

## 2018-11-28 DIAGNOSIS — E860 Dehydration: Secondary | ICD-10-CM

## 2018-11-28 DIAGNOSIS — E872 Acidosis: Secondary | ICD-10-CM

## 2018-11-28 DIAGNOSIS — K219 Gastro-esophageal reflux disease without esophagitis: Secondary | ICD-10-CM

## 2018-11-28 DIAGNOSIS — E785 Hyperlipidemia, unspecified: Secondary | ICD-10-CM

## 2018-11-28 DIAGNOSIS — R7989 Other specified abnormal findings of blood chemistry: Secondary | ICD-10-CM | POA: Diagnosis not present

## 2018-11-29 DIAGNOSIS — D619 Aplastic anemia, unspecified: Secondary | ICD-10-CM | POA: Diagnosis not present

## 2018-11-29 DIAGNOSIS — R7881 Bacteremia: Secondary | ICD-10-CM | POA: Diagnosis not present

## 2018-11-29 DIAGNOSIS — R7989 Other specified abnormal findings of blood chemistry: Secondary | ICD-10-CM | POA: Diagnosis not present

## 2018-11-29 DIAGNOSIS — L8990 Pressure ulcer of unspecified site, unspecified stage: Secondary | ICD-10-CM | POA: Diagnosis not present

## 2018-11-29 DIAGNOSIS — E162 Hypoglycemia, unspecified: Secondary | ICD-10-CM | POA: Diagnosis not present

## 2018-11-30 DIAGNOSIS — D619 Aplastic anemia, unspecified: Secondary | ICD-10-CM | POA: Diagnosis not present

## 2018-11-30 DIAGNOSIS — R7881 Bacteremia: Secondary | ICD-10-CM | POA: Diagnosis not present

## 2018-11-30 DIAGNOSIS — R7989 Other specified abnormal findings of blood chemistry: Secondary | ICD-10-CM | POA: Diagnosis not present

## 2018-11-30 DIAGNOSIS — E162 Hypoglycemia, unspecified: Secondary | ICD-10-CM | POA: Diagnosis not present

## 2018-11-30 DIAGNOSIS — I340 Nonrheumatic mitral (valve) insufficiency: Secondary | ICD-10-CM

## 2018-11-30 DIAGNOSIS — I361 Nonrheumatic tricuspid (valve) insufficiency: Secondary | ICD-10-CM | POA: Diagnosis not present

## 2018-11-30 DIAGNOSIS — L8990 Pressure ulcer of unspecified site, unspecified stage: Secondary | ICD-10-CM | POA: Diagnosis not present

## 2018-12-01 DIAGNOSIS — I341 Nonrheumatic mitral (valve) prolapse: Secondary | ICD-10-CM

## 2018-12-01 DIAGNOSIS — L8990 Pressure ulcer of unspecified site, unspecified stage: Secondary | ICD-10-CM | POA: Diagnosis not present

## 2018-12-01 DIAGNOSIS — I361 Nonrheumatic tricuspid (valve) insufficiency: Secondary | ICD-10-CM | POA: Diagnosis not present

## 2018-12-01 DIAGNOSIS — R7881 Bacteremia: Secondary | ICD-10-CM | POA: Diagnosis not present

## 2018-12-01 DIAGNOSIS — E162 Hypoglycemia, unspecified: Secondary | ICD-10-CM | POA: Diagnosis not present

## 2018-12-01 DIAGNOSIS — R7989 Other specified abnormal findings of blood chemistry: Secondary | ICD-10-CM | POA: Diagnosis not present

## 2018-12-01 DIAGNOSIS — D619 Aplastic anemia, unspecified: Secondary | ICD-10-CM | POA: Diagnosis not present

## 2018-12-02 ENCOUNTER — Inpatient Hospital Stay (HOSPITAL_COMMUNITY)
Admission: AD | Admit: 2018-12-02 | Discharge: 2018-12-14 | DRG: 163 | Disposition: A | Discharge status: Home Health | Payer: Medicare Other | Source: Other Acute Inpatient Hospital | Attending: Internal Medicine | Admitting: Internal Medicine

## 2018-12-02 DIAGNOSIS — Y95 Nosocomial condition: Secondary | ICD-10-CM | POA: Diagnosis present

## 2018-12-02 DIAGNOSIS — Z981 Arthrodesis status: Secondary | ICD-10-CM

## 2018-12-02 DIAGNOSIS — Z978 Presence of other specified devices: Secondary | ICD-10-CM | POA: Diagnosis not present

## 2018-12-02 DIAGNOSIS — G8929 Other chronic pain: Secondary | ICD-10-CM | POA: Diagnosis present

## 2018-12-02 DIAGNOSIS — L8990 Pressure ulcer of unspecified site, unspecified stage: Secondary | ICD-10-CM

## 2018-12-02 DIAGNOSIS — F419 Anxiety disorder, unspecified: Secondary | ICD-10-CM | POA: Diagnosis present

## 2018-12-02 DIAGNOSIS — L89310 Pressure ulcer of right buttock, unstageable: Secondary | ICD-10-CM | POA: Diagnosis present

## 2018-12-02 DIAGNOSIS — R791 Abnormal coagulation profile: Secondary | ICD-10-CM | POA: Diagnosis not present

## 2018-12-02 DIAGNOSIS — G4700 Insomnia, unspecified: Secondary | ICD-10-CM | POA: Diagnosis present

## 2018-12-02 DIAGNOSIS — J869 Pyothorax without fistula: Secondary | ICD-10-CM | POA: Diagnosis not present

## 2018-12-02 DIAGNOSIS — Z681 Body mass index (BMI) 19 or less, adult: Secondary | ICD-10-CM | POA: Diagnosis not present

## 2018-12-02 DIAGNOSIS — R7989 Other specified abnormal findings of blood chemistry: Secondary | ICD-10-CM | POA: Diagnosis not present

## 2018-12-02 DIAGNOSIS — E559 Vitamin D deficiency, unspecified: Secondary | ICD-10-CM | POA: Diagnosis present

## 2018-12-02 DIAGNOSIS — K264 Chronic or unspecified duodenal ulcer with hemorrhage: Secondary | ICD-10-CM | POA: Diagnosis not present

## 2018-12-02 DIAGNOSIS — M542 Cervicalgia: Secondary | ICD-10-CM | POA: Diagnosis present

## 2018-12-02 DIAGNOSIS — F17210 Nicotine dependence, cigarettes, uncomplicated: Secondary | ICD-10-CM | POA: Diagnosis present

## 2018-12-02 DIAGNOSIS — R531 Weakness: Secondary | ICD-10-CM | POA: Diagnosis present

## 2018-12-02 DIAGNOSIS — Z20828 Contact with and (suspected) exposure to other viral communicable diseases: Secondary | ICD-10-CM | POA: Diagnosis present

## 2018-12-02 DIAGNOSIS — E871 Hypo-osmolality and hyponatremia: Secondary | ICD-10-CM | POA: Diagnosis not present

## 2018-12-02 DIAGNOSIS — E785 Hyperlipidemia, unspecified: Secondary | ICD-10-CM | POA: Diagnosis present

## 2018-12-02 DIAGNOSIS — R7881 Bacteremia: Secondary | ICD-10-CM | POA: Diagnosis present

## 2018-12-02 DIAGNOSIS — J90 Pleural effusion, not elsewhere classified: Secondary | ICD-10-CM

## 2018-12-02 DIAGNOSIS — J440 Chronic obstructive pulmonary disease with acute lower respiratory infection: Secondary | ICD-10-CM | POA: Diagnosis not present

## 2018-12-02 DIAGNOSIS — E876 Hypokalemia: Secondary | ICD-10-CM | POA: Diagnosis present

## 2018-12-02 DIAGNOSIS — Z8719 Personal history of other diseases of the digestive system: Secondary | ICD-10-CM | POA: Diagnosis not present

## 2018-12-02 DIAGNOSIS — D638 Anemia in other chronic diseases classified elsewhere: Secondary | ICD-10-CM | POA: Diagnosis present

## 2018-12-02 DIAGNOSIS — Z79891 Long term (current) use of opiate analgesic: Secondary | ICD-10-CM

## 2018-12-02 DIAGNOSIS — J9601 Acute respiratory failure with hypoxia: Secondary | ICD-10-CM | POA: Diagnosis present

## 2018-12-02 DIAGNOSIS — I959 Hypotension, unspecified: Secondary | ICD-10-CM | POA: Diagnosis not present

## 2018-12-02 DIAGNOSIS — I10 Essential (primary) hypertension: Secondary | ICD-10-CM | POA: Diagnosis present

## 2018-12-02 DIAGNOSIS — E0590 Thyrotoxicosis, unspecified without thyrotoxic crisis or storm: Secondary | ICD-10-CM | POA: Diagnosis present

## 2018-12-02 DIAGNOSIS — Z720 Tobacco use: Secondary | ICD-10-CM | POA: Diagnosis not present

## 2018-12-02 DIAGNOSIS — J939 Pneumothorax, unspecified: Secondary | ICD-10-CM

## 2018-12-02 DIAGNOSIS — I951 Orthostatic hypotension: Secondary | ICD-10-CM | POA: Diagnosis present

## 2018-12-02 DIAGNOSIS — R636 Underweight: Secondary | ICD-10-CM | POA: Diagnosis present

## 2018-12-02 DIAGNOSIS — D619 Aplastic anemia, unspecified: Secondary | ICD-10-CM | POA: Diagnosis not present

## 2018-12-02 DIAGNOSIS — I9581 Postprocedural hypotension: Secondary | ICD-10-CM | POA: Diagnosis present

## 2018-12-02 DIAGNOSIS — L89320 Pressure ulcer of left buttock, unstageable: Secondary | ICD-10-CM | POA: Diagnosis present

## 2018-12-02 DIAGNOSIS — L89319 Pressure ulcer of right buttock, unspecified stage: Secondary | ICD-10-CM | POA: Diagnosis not present

## 2018-12-02 DIAGNOSIS — F17200 Nicotine dependence, unspecified, uncomplicated: Secondary | ICD-10-CM | POA: Diagnosis not present

## 2018-12-02 DIAGNOSIS — Z7982 Long term (current) use of aspirin: Secondary | ICD-10-CM

## 2018-12-02 DIAGNOSIS — D500 Iron deficiency anemia secondary to blood loss (chronic): Secondary | ICD-10-CM | POA: Diagnosis present

## 2018-12-02 DIAGNOSIS — B9561 Methicillin susceptible Staphylococcus aureus infection as the cause of diseases classified elsewhere: Secondary | ICD-10-CM | POA: Diagnosis not present

## 2018-12-02 DIAGNOSIS — Z79899 Other long term (current) drug therapy: Secondary | ICD-10-CM

## 2018-12-02 DIAGNOSIS — M549 Dorsalgia, unspecified: Secondary | ICD-10-CM | POA: Diagnosis present

## 2018-12-02 DIAGNOSIS — E162 Hypoglycemia, unspecified: Secondary | ICD-10-CM | POA: Diagnosis not present

## 2018-12-02 DIAGNOSIS — L89159 Pressure ulcer of sacral region, unspecified stage: Secondary | ICD-10-CM | POA: Diagnosis present

## 2018-12-02 DIAGNOSIS — K269 Duodenal ulcer, unspecified as acute or chronic, without hemorrhage or perforation: Secondary | ICD-10-CM

## 2018-12-02 DIAGNOSIS — E7800 Pure hypercholesterolemia, unspecified: Secondary | ICD-10-CM | POA: Diagnosis present

## 2018-12-02 HISTORY — DX: Pressure ulcer of unspecified site, unspecified stage: L89.90

## 2018-12-02 HISTORY — DX: Pyothorax without fistula: J86.9

## 2018-12-02 HISTORY — DX: Methicillin susceptible Staphylococcus aureus infection as the cause of diseases classified elsewhere: B95.61

## 2018-12-02 HISTORY — DX: Bacteremia: R78.81

## 2018-12-02 HISTORY — DX: Other chronic pain: G89.29

## 2018-12-02 HISTORY — DX: Chronic obstructive pulmonary disease, unspecified: J44.9

## 2018-12-02 HISTORY — DX: Duodenal ulcer, unspecified as acute or chronic, without hemorrhage or perforation: K26.9

## 2018-12-02 LAB — CBC WITH DIFFERENTIAL/PLATELET

## 2018-12-02 MED ORDER — SENNOSIDES-DOCUSATE SODIUM 8.6-50 MG PO TABS
1.0000 | ORAL_TABLET | Freq: Every evening | ORAL | Status: DC | PRN
  Administered 2018-12-05: 1 via ORAL

## 2018-12-02 MED ORDER — OXYCODONE HCL 5 MG PO TABS
15.0000 mg | ORAL_TABLET | Freq: Four times a day (QID) | ORAL | Status: DC | PRN
  Administered 2018-12-03 (×2): 15 mg via ORAL
  Filled 2018-12-02 (×2): qty 3

## 2018-12-02 MED ORDER — GABAPENTIN 600 MG PO TABS
600.0000 mg | ORAL_TABLET | Freq: Four times a day (QID) | ORAL | Status: DC
  Administered 2018-12-03 – 2018-12-14 (×46): 600 mg via ORAL
  Filled 2018-12-02 (×46): qty 1

## 2018-12-02 MED ORDER — ONDANSETRON HCL 4 MG PO TABS: 4.0000 mg | ORAL_TABLET | Freq: Four times a day (QID) | ORAL | Status: DC | PRN

## 2018-12-02 MED ORDER — DIAZEPAM 2 MG PO TABS
2.0000 mg | ORAL_TABLET | Freq: Every day | ORAL | Status: DC
  Administered 2018-12-03 – 2018-12-04 (×3): 2 mg via ORAL
  Filled 2018-12-02 (×3): qty 1

## 2018-12-02 MED ORDER — ATORVASTATIN CALCIUM 10 MG PO TABS
20.0000 mg | ORAL_TABLET | Freq: Every day | ORAL | Status: DC
  Administered 2018-12-03 – 2018-12-13 (×12): 20 mg via ORAL
  Filled 2018-12-02 (×12): qty 2

## 2018-12-02 MED ORDER — ACETAMINOPHEN 325 MG PO TABS
650.0000 mg | ORAL_TABLET | Freq: Four times a day (QID) | ORAL | Status: DC | PRN
  Administered 2018-12-03 – 2018-12-13 (×5): 650 mg via ORAL
  Filled 2018-12-02 (×5): qty 2

## 2018-12-02 MED ORDER — SODIUM CHLORIDE 0.9% FLUSH
3.0000 mL | Freq: Two times a day (BID) | INTRAVENOUS | Status: DC
  Administered 2018-12-03 (×2): 3 mL via INTRAVENOUS

## 2018-12-02 MED ORDER — CEFAZOLIN SODIUM-DEXTROSE 2-4 GM/100ML-% IV SOLN
2.0000 g | Freq: Three times a day (TID) | INTRAVENOUS | Status: DC
  Administered 2018-12-03 – 2018-12-14 (×36): 2 g via INTRAVENOUS
  Filled 2018-12-02 (×38): qty 100

## 2018-12-02 MED ORDER — PANTOPRAZOLE SODIUM 40 MG PO TBEC
40.0000 mg | DELAYED_RELEASE_TABLET | Freq: Every day | ORAL | Status: DC
  Administered 2018-12-03 – 2018-12-11 (×9): 40 mg via ORAL
  Filled 2018-12-02 (×10): qty 1

## 2018-12-02 MED ORDER — ONDANSETRON HCL 4 MG/2ML IJ SOLN: 4.0000 mg | Freq: Four times a day (QID) | INTRAMUSCULAR | Status: DC | PRN

## 2018-12-02 MED ORDER — ACETAMINOPHEN 650 MG RE SUPP: 650.0000 mg | Freq: Four times a day (QID) | RECTAL | Status: DC | PRN

## 2018-12-02 MED ORDER — ALBUTEROL SULFATE (2.5 MG/3ML) 0.083% IN NEBU: 3.0000 mL | INHALATION_SOLUTION | RESPIRATORY_TRACT | Status: DC | PRN

## 2018-12-02 MED ORDER — RIFAMPIN 300 MG PO CAPS
300.0000 mg | ORAL_CAPSULE | Freq: Two times a day (BID) | ORAL | Status: DC
  Administered 2018-12-03 – 2018-12-14 (×24): 300 mg via ORAL
  Filled 2018-12-02 (×25): qty 1

## 2018-12-02 NOTE — H&P (Signed)
History and Physical    Randy Page BSW:[id number] DOB: 06/05/1956 DOA: 12/02/2018  PCP: [name], MD  Patient coming from: [facility]  I have personally briefly reviewed patient's old medical records in [place]  Chief Complaint: Bacteremia and right-sided empyema  HPI: Randy Page is a 63 y.o. male with medical history significant for hypertension, hyperlipidemia, recent GI bleed due to duodenal ulcer, anemia, chronic neck pain status post cervical fusion who is transferred to [facility] hospital from [facility] for further management of right-sided empyema.  Patient had recent hospitalization from 4/27-4/30/2020 at [facility] for anemia due to GI bleed and underwent EGD which revealed a duodenal ulcer which was clipped.  He returned to renal hospital with generalized weakness and a mechanical fall.  Work-up revealed MSSA bacteremia and a right-sided large complex pleural effusion. Transthoracic echocardiogram on 11/30/2018 showed questionable mitral valve vegetation.  TEE on 12/01/2018 was negative for valvular vegetation.  He underwent CT-guided thoracostomy by IR on 12/01/18 with 1 L of frank purulent material obtained.  Pleural fluid culture reportedly grew gram-positive cocci.  Cardiothoracic surgery were consulted and recommended transfer to [facility] for CT surgery evaluation and management.  Patient was initially on IV Zosyn and transition to IV cefazolin.  Rifampin was also started due to patient's history of cervical fusion with hardware in place.  On admission to [facility] hospital, patient reports intermittent dyspnea with activity otherwise denies any chest pain, palpitations, subjective fevers, chills, diaphoresis, abdominal pain, dysuria, peripheral edema, or obvious bleeding.  Review of Systems: All systems reviewed and are negative except as documented in history of present illness above.   Past Medical History:  Diagnosis Date  .  Chronically on opiate therapy   . Hypercholesteremia   . Hyperlipidemia   . Hypertension   . Low back pain   . Thyrotoxicosis   . Vitamin D deficiency     No past surgical history on file.  Social History:  reports that he has been smoking. He has never used smokeless tobacco. No history on file for alcohol and drug.  No Known Allergies  No family history on file.   Prior to Admission medications   Medication Sig Start Date End Date Taking? Authorizing Provider  aspirin EC 81 MG tablet Take 1 tablet (81 mg total) by mouth daily. 09/15/17   Revankar, Reita Cliche, MD  atorvastatin (LIPITOR) 20 MG tablet Take 20 mg by mouth at bedtime. 09/02/17   [provider]  diazepam (VALIUM) 2 MG tablet Take 2 mg by mouth at bedtime. 09/06/17   [provider]  doxepin (SINEQUAN) 100 MG capsule Take 100 mg by mouth at bedtime. 09/03/17   [provider]  gabapentin (NEURONTIN) 600 MG tablet Take 600 mg by mouth 4 (four) times daily. 08/03/17   [provider]  methimazole (TAPAZOLE) 5 MG tablet Take 5 mg by mouth 2 (two) times daily. 08/02/17   [provider]  NARCAN 4 MG/0.1ML LIQD nasal spray kit USE 1 (ONE) SPRAY AS NEEDED 09/06/17   [provider]  oxyCODONE (ROXICODONE) 15 MG immediate release tablet Take 15 mg by mouth 4 (four) times daily. 09/06/17   [provider]  pantoprazole (PROTONIX) 40 MG tablet Take 40 mg by mouth daily. 09/03/17   [provider]  PROAIR HFA 108 (90 Base) MCG/ACT inhaler INHALE 2 PUFFS BY MOUTH 3 TIMES DAILY 09/03/17   [provider]    Physical Exam: Vitals:   12/02/18  2203 12/02/18 2208  BP: (!) 88/63 98/78  Pulse: (!) 102 (!) 101  Resp: (!) 21 20  Temp: 99.2 F (37.3 C)   TempSrc: Oral   SpO2: 99% 98%  Weight:  59.8 kg  Height:  _0  (1.753 m)    Constitutional: Resting supine in bed, NAD, calm, comfortable Eyes: PERRL, lids and conjunctivae normal ENMT: Mucous membranes are moist.  Posterior pharynx clear of any exudate or lesions.Normal dentition.  Neck: normal, supple, no masses. Respiratory: Diminished breath sounds to the right lung base otherwise breath sounds are distant and clear.  Normal respiratory effort. No accessory muscle use.  Right thoracostomy tube in place with yellow-colored fluid drainage. Cardiovascular: Regular rate and rhythm, no murmurs / rubs / gallops. No extremity edema. 2+ pedal pulses. Abdomen: no tenderness, no masses palpated. No hepatosplenomegaly. Bowel sounds positive.  Musculoskeletal: no clubbing / cyanosis. No joint deformity upper and lower extremities. Good ROM, no contractures. Normal muscle tone.  Skin: Decubitus ulcer with wound dressing in place. Neurologic: CN 2-12 grossly intact. Sensation intact, Strength 5/5 in all 4.  Psychiatric: Normal judgment and insight. Alert and oriented x 3. Normal mood.    Labs on Admission: I have personally reviewed following labs and imaging studies  CBC: Recent Labs  Lab 12/02/18 2341  WBC 4.2  NEUTROABS PENDING  HGB 7.7*  HCT 25.6*  MCV 95.9  PLT 025   Basic Metabolic Panel: No results for input(s): NA, K, CL, CO2, GLUCOSE, BUN, CREATININE, CALCIUM, MG, PHOS in the last 168 hours. GFR: CrCl cannot be calculated (Patient's most recent lab result is older than the maximum 21 days allowed.). Liver Function Tests: No results for input(s): AST, ALT, ALKPHOS, BILITOT, PROT, ALBUMIN in the last 168 hours. No results for input(s): LIPASE, AMYLASE in the last 168 hours. No results for input(s): AMMONIA in the last 168 hours. Coagulation Profile: No results for input(s): INR, PROTIME in the last 168 hours. Cardiac Enzymes: No results for input(s): CKTOTAL, CKMB, CKMBINDEX, TROPONINI in the last 168 hours. BNP (last 3 results) No results for input(s): PROBNP in the last 8760 hours. HbA1C: No results for input(s): HGBA1C in the last 72 hours. CBG: No results for input(s): GLUCAP in the  last 168 hours. Lipid Profile: No results for input(s): CHOL, HDL, LDLCALC, TRIG, CHOLHDL, LDLDIRECT in the last 72 hours. Thyroid Function Tests: No results for input(s): TSH, T4TOTAL, FREET4, T3FREE, THYROIDAB in the last 72 hours. Anemia Panel: No results for input(s): VITAMINB12, FOLATE, FERRITIN, TIBC, IRON, RETICCTPCT in the last 72 hours. Urine analysis: No results found for: COLORURINE, APPEARANCEUR, LABSPEC, PHURINE, GLUCOSEU, HGBUR, BILIRUBINUR, KETONESUR, PROTEINUR, UROBILINOGEN, NITRITE, LEUKOCYTESUR  Radiological Exams on Admission: No results found.  EKG: Ordered and pending  Assessment/Plan Principal Problem:   MSSA bacteremia Active Problems:   Essential hypertension   Hyperlipidemia   Empyema of right pleural space (HCC)   Chronic pain   Duodenal ulcer   Decubitus ulcer  Randy Page is a 63 y.o. male with medical history significant for hypertension, hyperlipidemia, recent GI bleed due to duodenal ulcer, anemia, chronic neck pain status post cervical fusion who is transferred to [facility] hospital from [facility] for further management of right-sided empyema and MSSA bacteremia.   MSSA bacteremia due to right-sided empyema: -Status post IR thoracostomy 11/30/2018 -Continue IV cefazolin and oral rifampin -will need 3-week course from date of first negative cultures -Repeat blood and pleural fluid cultures -Repeat 2 view chest x-ray -Cardiothoracic surgery consulted, Dr. Cyndia Bent  to see in AM -Will keep n.p.o. at midnight in case of any further intervention  Anemia/duodenal ulcer: S/p EGD and clipping during 4/27-4/30/2020 admission at [facility].  Patient denies any further obvious bleeding. -Obtain CBC now on recheck in a.m. -Continue Protonix  Decubitus ulcer: Seen by PT wound care at [facility].  Wound dressings in place. -Consult to wound care  Hypertension: Blood pressures are soft, maintaining MAP.  Continue to monitor off  antihypertensives.  Hyperlipidemia: -Continue atorvastatin  COPD:  -Albuterol as needed  Chronic neck and back pain: S/P cervical fusion with hardware in place.  On chronic Oxy IR, there was concern for narcotic withdrawal at [place] home OxyIR 15 mg 4 times daily PRN with hold parameters -Continue gabapentin  Insomnia/anxiety: -Continue home Valium qhs with hold parameters   DVT prophylaxis: SCDs Code Status: Full code, confirmed with patient Family Communication: Patient declined, he discussed with family himself Disposition Plan: Pending clinical progress Consults called: Cardiothoracic surgery, Dr. Cyndia Bent to see in a.m. Admission status: Inpatient, patient requires greater than 2 midnight length stay for management of MSSA bacteria, right pleural empyema requiring cardiothoracic surgery consultation and evaluation and prolonged IV antibiotics pending further culture data.   Zada Finders MD [facility]  If 7PM-7AM, please contact night-coverage [email or web address]  12/03/2018, 12:27 AM

## 2018-12-03 ENCOUNTER — Inpatient Hospital Stay (HOSPITAL_COMMUNITY): Payer: Medicare Other

## 2018-12-03 DIAGNOSIS — I10 Essential (primary) hypertension: Secondary | ICD-10-CM

## 2018-12-03 DIAGNOSIS — I959 Hypotension, unspecified: Secondary | ICD-10-CM

## 2018-12-03 DIAGNOSIS — J869 Pyothorax without fistula: Secondary | ICD-10-CM

## 2018-12-03 DIAGNOSIS — Z720 Tobacco use: Secondary | ICD-10-CM

## 2018-12-03 DIAGNOSIS — R791 Abnormal coagulation profile: Secondary | ICD-10-CM

## 2018-12-03 HISTORY — DX: Abnormal coagulation profile: R79.1

## 2018-12-03 LAB — COMPREHENSIVE METABOLIC PANEL
ALT: 7 U/L (ref 0–44)
ALT: 8 U/L (ref 0–44)
AST: 16 U/L (ref 15–41)
AST: 13 U/L — ABNORMAL LOW (ref 15–41)
Albumin: 1.3 g/dL — ABNORMAL LOW (ref 3.5–5.0)
Albumin: 1.3 g/dL — ABNORMAL LOW (ref 3.5–5.0)
Alkaline Phosphatase: 99 U/L (ref 38–126)
Alkaline Phosphatase: 95 U/L (ref 38–126)
Anion gap: 3 — ABNORMAL LOW (ref 5–15)
Anion gap: 9 (ref 5–15)
BUN: 9 mg/dL (ref 8–23)
BUN: 8 mg/dL (ref 8–23)
CO2: 28 mmol/L (ref 22–32)
CO2: 25 mmol/L (ref 22–32)
Calcium: 7.2 mg/dL — ABNORMAL LOW (ref 8.9–10.3)
Calcium: 7.4 mg/dL — ABNORMAL LOW (ref 8.9–10.3)
Chloride: 103 mmol/L (ref 98–111)
Chloride: 102 mmol/L (ref 98–111)
Creatinine, Ser: 0.6 mg/dL — ABNORMAL LOW (ref 0.61–1.24)
Creatinine, Ser: 0.6 mg/dL — ABNORMAL LOW (ref 0.61–1.24)
GFR calc Af Amer: >60 mL/min (ref >60)
GFR calc Af Amer: >60 mL/min (ref >60)
GFR calc non Af Amer: >60 mL/min (ref >60)
GFR calc non Af Amer: >60 mL/min (ref >60)
Glucose, Bld: 103 mg/dL — ABNORMAL HIGH (ref 70–99)
Glucose, Bld: 93 mg/dL (ref 70–99)
Potassium: 3.4 mmol/L — ABNORMAL LOW (ref 3.5–5.1)
Potassium: 3.4 mmol/L — ABNORMAL LOW (ref 3.5–5.1)
Sodium: 134 mmol/L — ABNORMAL LOW (ref 135–145)
Sodium: 136 mmol/L (ref 135–145)
Total Bilirubin: 0.7 mg/dL (ref 0.3–1.2)
Total Bilirubin: 0.7 mg/dL (ref 0.3–1.2)
Total Protein: 5.5 g/dL — ABNORMAL LOW (ref 6.5–8.1)
Total Protein: 5.3 g/dL — ABNORMAL LOW (ref 6.5–8.1)

## 2018-12-03 LAB — SARS CORONAVIRUS 2 BY RT PCR (HOSPITAL ORDER, PERFORMED IN ~~LOC~~ HOSPITAL LAB): SARS Coronavirus 2: NEGATIVE

## 2018-12-03 LAB — CBC WITH DIFFERENTIAL/PLATELET
Abs Immature Granulocytes: 0.01 10*3/uL (ref 0.00–0.07)
Basophils Absolute: 0 10*3/uL (ref 0.0–0.1)
Basophils Relative: 1 %
Eosinophils Absolute: 0.1 10*3/uL (ref 0.0–0.5)
Eosinophils Relative: 1 %
HCT: 25.6 % — ABNORMAL LOW (ref 39.0–52.0)
Hemoglobin: 7.7 g/dL — ABNORMAL LOW (ref 13.0–17.0)
Immature Granulocytes: 0 %
Lymphocytes Relative: 29 %
Lymphs Abs: 1.2 10*3/uL (ref 0.7–4.0)
MCH: 28.8 pg (ref 26.0–34.0)
MCHC: 30.1 g/dL (ref 30.0–36.0)
MCV: 95.9 fL (ref 80.0–100.0)
Monocytes Absolute: 0.3 10*3/uL (ref 0.1–1.0)
Monocytes Relative: 8 %
Neutro Abs: 2.6 10*3/uL (ref 1.7–7.7)
Neutrophils Relative %: 61 %
Platelets: 177 10*3/uL (ref 150–400)
RBC: 2.67 MIL/uL — ABNORMAL LOW (ref 4.22–5.81)
RDW: 18.6 % — ABNORMAL HIGH (ref 11.5–15.5)
WBC: 4.2 10*3/uL (ref 4.0–10.5)
nRBC: 0 % (ref 0.0–0.2)

## 2018-12-03 LAB — IRON AND TIBC
Iron: 21 ug/dL — ABNORMAL LOW (ref 45–182)
Saturation Ratios: 16 % — ABNORMAL LOW (ref 17.9–39.5)
TIBC: 134 ug/dL — ABNORMAL LOW (ref 250–450)
UIBC: 113 ug/dL

## 2018-12-03 LAB — RETICULOCYTES
Immature Retic Fract: 22.3 % — ABNORMAL HIGH (ref 2.3–15.9)
RBC.: 2.99 MIL/uL — ABNORMAL LOW (ref 4.22–5.81)
Retic Count, Absolute: 56.8 10*3/uL (ref 19.0–186.0)
Retic Ct Pct: 1.9 % (ref 0.4–3.1)

## 2018-12-03 LAB — CBC
HCT: 24.6 % — ABNORMAL LOW (ref 39.0–52.0)
Hemoglobin: 7.4 g/dL — ABNORMAL LOW (ref 13.0–17.0)
MCH: 28.9 pg (ref 26.0–34.0)
MCHC: 30.1 g/dL (ref 30.0–36.0)
MCV: 96.1 fL (ref 80.0–100.0)
Platelets: 181 10*3/uL (ref 150–400)
RBC: 2.56 MIL/uL — ABNORMAL LOW (ref 4.22–5.81)
RDW: 18.8 % — ABNORMAL HIGH (ref 11.5–15.5)
WBC: 4.8 10*3/uL (ref 4.0–10.5)
nRBC: 0 % (ref 0.0–0.2)

## 2018-12-03 LAB — PROTIME-INR
INR: 1.5 — ABNORMAL HIGH (ref 0.8–1.2)
Prothrombin Time: 17.5 seconds — ABNORMAL HIGH (ref 11.4–15.2)

## 2018-12-03 LAB — FERRITIN: Ferritin: 244 ng/mL (ref 24–336)

## 2018-12-03 MED ORDER — OXYCODONE HCL 5 MG PO TABS
15.0000 mg | ORAL_TABLET | ORAL | Status: DC | PRN
  Administered 2018-12-03 – 2018-12-14 (×43): 15 mg via ORAL
  Filled 2018-12-03 (×43): qty 3

## 2018-12-03 MED ORDER — ADULT MULTIVITAMIN W/MINERALS CH
1.0000 | ORAL_TABLET | Freq: Every day | ORAL | Status: DC
  Administered 2018-12-03 – 2018-12-14 (×12): 1 via ORAL
  Filled 2018-12-03 (×12): qty 1

## 2018-12-03 MED ORDER — PHYTONADIONE 5 MG PO TABS
5.0000 mg | ORAL_TABLET | Freq: Every day | ORAL | Status: AC
  Administered 2018-12-03 – 2018-12-05 (×3): 5 mg via ORAL
  Filled 2018-12-03 (×3): qty 1

## 2018-12-03 MED ORDER — ENSURE ENLIVE PO LIQD
237.0000 mL | Freq: Three times a day (TID) | ORAL | Status: DC
  Administered 2018-12-03 – 2018-12-14 (×17): 237 mL via ORAL

## 2018-12-03 MED ORDER — NICOTINE 14 MG/24HR TD PT24
14.0000 mg | MEDICATED_PATCH | Freq: Every day | TRANSDERMAL | Status: DC
  Administered 2018-12-03 – 2018-12-14 (×12): 14 mg via TRANSDERMAL
  Filled 2018-12-03 (×12): qty 1

## 2018-12-03 MED ORDER — ENSURE ENLIVE PO LIQD: 237.0000 mL | Freq: Two times a day (BID) | ORAL | Status: DC

## 2018-12-03 NOTE — Consult Note (Signed)
[address]       [place] [address]             [phone]      Cardiothoracic Surgery Consultation  Reason for Consult: Right empyema or lung abscess Referring Physician: Sherrye Payor, MD  Randy Page is an 63 y.o. male.  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  The patient was found to be profoundly anemic and diagnosed with a GI bleed and underwent upper endoscopy showing a duodenal ulcer which was clipped.  He said that it was hemoglobin was 4.6 on admission.  He did require transfusion.  He was discharged home and then returned to [facility] with generalized weakness and another fall.  His work-up showed MSSA bacteremia and a large complex right sided pleural fluid collection.  He had a transthoracic echo on 11/30/2018 that showed a questionable mitral valve vegetation.  TEE on 12/01/2018 was negative for valvular vegetation.  He underwent a CT-guided pigtail catheter placement into the right chest fluid collection on 12/01/2018 by interventional radiology.  1 L of frankly purulent fluid was removed.  Pleural fluid culture reportedly grew gram-positive cocci.  The patient was transferred to [facility] for further care.  The patient currently denies any chest pain, shortness of breath, fever or chills.  He does report having some cough for the past several weeks and said that he was coughing up some sputum that look like the fluid that was drained from his chest.  Past Medical History:  Diagnosis Date  . Chronically on opiate therapy   . Hypercholesteremia   . Hyperlipidemia   . Hypertension   . Low back pain   . Thyrotoxicosis   . Vitamin D deficiency     Past surgical history: He underwent cervical  discectomy x3 with anterior cervical plating by Dr. Arnoldo Morale in January 2007.  He subsequently underwent C4-C7 posterior cervical fusion in 02/2006.  Family Hx: Negative  Social History: The patient lives in [place] with his girlfriend of 2 years.  He smokes about 1 to 1/2 pack of cigarettes per day.  He quit drinking alcohol years ago.  Denies any illegal drug abuse.  He is on chronic narcotics for neck pain.  Allergies: No Known Allergies  Medications:  I have reviewed the patient's current medications. Prior to Admission:  Medications Prior to Admission  Medication Sig Dispense Refill Last Dose  . aspirin EC 81 MG tablet Take 1 tablet (81 mg total) by mouth daily. 90 tablet 3   . atorvastatin (LIPITOR) 20 MG tablet Take 20 mg by mouth at bedtime.  3 Taking  . diazepam (VALIUM) 2 MG tablet Take 2 mg by mouth at bedtime.  0 Taking  . doxepin (SINEQUAN) 100 MG capsule Take 100 mg by mouth at bedtime.  3 Taking  . gabapentin (NEURONTIN) 600 MG tablet Take 600 mg by mouth 4 (four) times daily.  3 Taking  . methimazole (TAPAZOLE) 5 MG tablet Take 5 mg by mouth 2 (two) times daily.  0 Taking  . NARCAN 4 MG/0.1ML LIQD nasal spray kit USE 1 (ONE) SPRAY AS NEEDED  2 Taking  . oxyCODONE (ROXICODONE) 15 MG immediate release tablet Take 15 mg by mouth 4 (  four) times daily.  0 Taking  . pantoprazole (PROTONIX) 40 MG tablet Take 40 mg by mouth daily.  3 Taking  . PROAIR HFA 108 (90 Base) MCG/ACT inhaler INHALE 2 PUFFS BY MOUTH 3 TIMES DAILY  3 Taking   Scheduled: . atorvastatin  20 mg Oral QHS  . diazepam  2 mg Oral QHS  . feeding supplement (ENSURE ENLIVE)  237 mL Oral TID BM  . gabapentin  600 mg Oral QID  . multivitamin with minerals  1 tablet Oral Daily  . nicotine  14 mg Transdermal Daily  . pantoprazole  40 mg Oral Daily  . phytonadione  5 mg Oral Daily  . rifampin  300 mg Oral Q12H  . sodium chloride flush  3 mL Intravenous Q12H   Continuous: .  ceFAZolin (ANCEF) IV 2 g (12/03/18  1417)   YHC:[id number] **OR** acetaminophen, albuterol, ondansetron **OR** ondansetron (ZOFRAN) IV, oxyCODONE, senna-docusate Anti-infectives (From admission, onward)   Start     Dose/Rate Route Frequency Ordered Stop   12/02/18 2330  ceFAZolin (ANCEF) IVPB 2g/100 mL premix     2 g 200 mL/hr over 30 Minutes Intravenous Every 8 hours 12/02/18 2322     12/02/18 2330  rifampin (RIFADIN) capsule 300 mg     300 mg Oral Every 12 hours 12/02/18 2322        Results for orders placed or performed during the hospital encounter of 12/02/18 (from the past 48 hour(s))  Comprehensive metabolic panel     Status: Abnormal   Collection Time: 12/02/18 11:41 PM  Result Value Ref Range   Sodium 134 (L) 135 - 145 mmol/L   Potassium 3.4 (L) 3.5 - 5.1 mmol/L   Chloride 103 98 - 111 mmol/L   CO2 28 22 - 32 mmol/L   Glucose, Bld 103 (H) 70 - 99 mg/dL   BUN 9 8 - 23 mg/dL   Creatinine, Ser 0.60 (L) 0.61 - 1.24 mg/dL   Calcium 7.2 (L) 8.9 - 10.3 mg/dL   Total Protein 5.5 (L) 6.5 - 8.1 g/dL   Albumin 1.3 (L) 3.5 - 5.0 g/dL   AST 16 15 - 41 U/L   ALT 7 0 - 44 U/L   Alkaline Phosphatase 99 38 - 126 U/L   Total Bilirubin 0.7 0.3 - 1.2 mg/dL   GFR calc non Af Amer >60 >60 mL/min   GFR calc Af Amer >60 >60 mL/min   Anion gap 3 (L) 5 - 15    Comment: Performed at [facility], [address], [place], [place] [address]  CBC WITH DIFFERENTIAL     Status: Abnormal   Collection Time: 12/02/18 11:41 PM  Result Value Ref Range   WBC 4.2 4.0 - 10.5 K/uL   RBC 2.67 (L) 4.22 - 5.81 MIL/uL   Hemoglobin 7.7 (L) 13.0 - 17.0 g/dL   HCT 25.6 (L) 39.0 - 52.0 %   MCV 95.9 80.0 - 100.0 fL   MCH 28.8 26.0 - 34.0 pg   MCHC 30.1 30.0 - 36.0 g/dL   RDW 18.6 (H) 11.5 - 15.5 %   Platelets 177 150 - 400 K/uL   nRBC 0.0 0.0 - 0.2 %   Neutrophils Relative % 61 %   Neutro Abs 2.6 1.7 - 7.7 K/uL   Lymphocytes Relative 29 %   Lymphs Abs 1.2 0.7 - 4.0 K/uL   Monocytes Relative 8 %   Monocytes Absolute 0.3 0.1 - 1.0  K/uL   Eosinophils Relative 1 %  Eosinophils Absolute 0.1 0.0 - 0.5 K/uL   Basophils Relative 1 %   Basophils Absolute 0.0 0.0 - 0.1 K/uL   Immature Granulocytes 0 %   Abs Immature Granulocytes 0.01 0.00 - 0.07 K/uL    Comment: Performed at [facility] [address] [place], [place] [address]  SARS Coronavirus 2 (CEPHEID - Performed in [place] hospital lab), Hosp Order     Status: None   Collection Time: 12/03/18  1:40 AM  Result Value Ref Range   SARS Coronavirus 2 NEGATIVE NEGATIVE    Comment: (NOTE) If result is NEGATIVE SARS-CoV-2 target nucleic acids are NOT DETECTED. The SARS-CoV-2 RNA is generally detectable in upper and lower  respiratory specimens during the acute phase of infection. The lowest  concentration of SARS-CoV-2 viral copies this assay can detect is 250  copies / mL. A negative result does not preclude SARS-CoV-2 infection  and should not be used as the sole basis for treatment or other  patient management decisions.  A negative result may occur with  improper specimen collection / handling, submission of specimen other  than nasopharyngeal swab, presence of viral mutation(s) within the  areas targeted by this assay, and inadequate number of viral copies  (<250 copies / mL). A negative result must be combined with clinical  observations, patient history, and epidemiological information. If result is POSITIVE SARS-CoV-2 target nucleic acids are DETECTED. The SARS-CoV-2 RNA is generally detectable in upper and lower  respiratory specimens dur ing the acute phase of infection.  Positive  results are indicative of active infection with SARS-CoV-2.  Clinical  correlation with patient history and other diagnostic information is  necessary to determine patient infection status.  Positive results do  not rule out bacterial infection or co-infection with other viruses. If result is PRESUMPTIVE POSTIVE SARS-CoV-2 nucleic acids MAY BE PRESENT.   A  presumptive positive result was obtained on the submitted specimen  and confirmed on repeat testing.  While 2019 novel coronavirus  (SARS-CoV-2) nucleic acids may be present in the submitted sample  additional confirmatory testing may be necessary for epidemiological  and / or clinical management purposes  to differentiate between  SARS-CoV-2 and other Sarbecovirus currently known to infect humans.  If clinically indicated additional testing with an alternate test  methodology [phone]) is advised. The SARS-CoV-2 RNA is generally  detectable in upper and lower respiratory sp ecimens during the acute  phase of infection. The expected result is Negative. Fact Sheet for Patients:  StrictlyIdeas.no Fact Sheet for Healthcare Providers: BankingDealers.co.za This test is not yet approved or cleared by the [place] FDA and has been authorized for detection and/or diagnosis of SARS-CoV-2 by FDA under an Emergency Use Authorization (EUA).  This EUA will remain in effect (meaning this test can be used) for the duration of the COVID-19 declaration under Section 564(b)(1) of the Act, 21 U.S.C. section 360bbb-3(b)(1), unless the authorization is terminated or revoked sooner. Performed at [facility], [place] [address] [place], [place] [address]   CBC     Status: Abnormal   Collection Time: 12/03/18  2:17 AM  Result Value Ref Range   WBC 4.8 4.0 - 10.5 K/uL   RBC 2.56 (L) 4.22 - 5.81 MIL/uL   Hemoglobin 7.4 (L) 13.0 - 17.0 g/dL   HCT 24.6 (L) 39.0 - 52.0 %   MCV 96.1 80.0 - 100.0 fL   MCH 28.9 26.0 - 34.0 pg   MCHC 30.1 30.0 - 36.0 g/dL  RDW 18.8 (H) 11.5 - 15.5 %   Platelets 181 150 - 400 K/uL   nRBC 0.0 0.0 - 0.2 %    Comment: Performed at [facility], [place] [address] [place], [place] [address]  Comprehensive metabolic panel     Status: Abnormal   Collection Time: 12/03/18  2:17 AM  Result Value Ref Range   Sodium 136 135 -  145 mmol/L   Potassium 3.4 (L) 3.5 - 5.1 mmol/L   Chloride 102 98 - 111 mmol/L   CO2 25 22 - 32 mmol/L   Glucose, Bld 93 70 - 99 mg/dL   BUN 8 8 - 23 mg/dL   Creatinine, Ser 0.60 (L) 0.61 - 1.24 mg/dL   Calcium 7.4 (L) 8.9 - 10.3 mg/dL   Total Protein 5.3 (L) 6.5 - 8.1 g/dL   Albumin 1.3 (L) 3.5 - 5.0 g/dL   AST 13 (L) 15 - 41 U/L   ALT 8 0 - 44 U/L   Alkaline Phosphatase 95 38 - 126 U/L   Total Bilirubin 0.7 0.3 - 1.2 mg/dL   GFR calc non Af Amer >60 >60 mL/min   GFR calc Af Amer >60 >60 mL/min   Anion gap 9 5 - 15    Comment: Performed at [facility], [place] [address], [place], [place] [address]  Protime-INR     Status: Abnormal   Collection Time: 12/03/18  2:17 AM  Result Value Ref Range   Prothrombin Time 17.5 (H) 11.4 - 15.2 seconds   INR 1.5 (H) 0.8 - 1.2    Comment: (NOTE) INR goal varies based on device and disease states. Performed at [facility], [place] [address], [place], [place] [address]   Iron and TIBC     Status: Abnormal   Collection Time: 12/03/18 10:50 AM  Result Value Ref Range   Iron 21 (L) 45 - 182 ug/dL   TIBC 134 (L) 250 - 450 ug/dL   Saturation Ratios 16 (L) 17.9 - 39.5 %   UIBC 113 ug/dL    Comment: Performed at [facility], [place] [address] [place], [place] [address]  Ferritin     Status: None   Collection Time: 12/03/18 10:50 AM  Result Value Ref Range   Ferritin 244 24 - 336 ng/mL    Comment: Performed at [facility], [place] [address], [place], [place] [address]  Reticulocytes     Status: Abnormal   Collection Time: 12/03/18 10:50 AM  Result Value Ref Range   Retic Ct Pct 1.9 0.4 - 3.1 %   RBC. 2.99 (L) 4.22 - 5.81 MIL/uL   Retic Count, Absolute 56.8 19.0 - 186.0 K/uL   Immature Retic Fract 22.3 (H) 2.3 - 15.9 %    Comment: Performed at [facility] [address] [place], [place] [address]    Dg Chest 2 View  Result Date: 12/03/2018 CLINICAL DATA:  Empyema EXAM: CHEST - 2 VIEW COMPARISON:  12/01/2018, CT  11/28/2018, radiograph 11/27/2018 FINDINGS: Right basilar drainage catheter. No change in residual pleural and parenchymal disease at the right base. Left lung is clear. IMPRESSION: No significant interval change in right basilar drainage catheter and residual pleural and parenchymal disease at the right base. Electronically Signed   By: Donavan Foil M.D.   On: 12/03/2018 04:09    Review of Systems  Constitutional: Positive for malaise/fatigue and weight loss. Negative for chills and fever.  HENT: Negative.  Edentulous  Eyes: Negative.   Respiratory: Positive for cough, sputum production and shortness of breath. Negative for hemoptysis.   Cardiovascular: Negative for chest pain, orthopnea and leg swelling.  Gastrointestinal: Negative.   Genitourinary: Negative.   Musculoskeletal: Positive for neck pain.  Skin: Negative.   Neurological: Positive for dizziness and loss of consciousness.  Endo/Heme/Allergies: Negative.   Psychiatric/Behavioral: Negative.    Blood pressure 132/61, pulse 91, temperature 98.1 F (36.7 C), temperature source Oral, resp. rate 17, height _0  (1.753 m), weight 58.7 kg, SpO2 97 %. Physical Exam  Constitutional: He is oriented to person, place, and time.  Thin, chronically ill and frail-appearing gentleman in no distress  HENT:  Head: Normocephalic and atraumatic.  Mouth/Throat: Oropharynx is clear and moist.  Edentulous  Eyes: Pupils are equal, round, and reactive to light. Conjunctivae and EOM are normal.  Cardiovascular: Normal rate, regular rhythm, normal heart sounds and intact distal pulses. Exam reveals no friction rub.  No murmur heard. Respiratory: Effort normal. He exhibits no tenderness.  Decreased breath sounds over the right lower hemithorax  GI: Soft. Bowel sounds are normal. He exhibits no distension and no mass. There is no abdominal tenderness.  Musculoskeletal: Normal range of motion.        General: No edema.  Lymphadenopathy:     He has no cervical adenopathy.  Neurological: He is alert and oriented to person, place, and time. He has normal strength.  Skin: Skin is warm and dry.  Psychiatric: He has a normal mood and affect. His behavior is normal. Judgment and thought content normal.    Diagnostic studies:  I have reviewed the patient's CT scan of the chest at [facility] from 11/28/2018 on the PACS system.  This showed a large fluid collection in the right lower hemithorax that had air bubbles in it and was not completely clear if this was in the pleural space or within the lung parenchyma itself suggesting an abscess.  I also reviewed the CT-guided pigtail catheter drainage from 12/01/2018 and the catheter was inserted directly into the fluid collection and drained over 1 L of fluid.  He subsequently had a CT scan of the abdomen on 12/02/2018 which showed the pigtail catheter sitting within the fluid collection which was incompletely drained.  There is still a large fluid collection with an air-fluid level within it and I suspect that this may be a lung abscess but could be an empyema.   Assessment/Plan:  This 63 year old chronically ill and frail-appearing gentleman on chronic narcotics for neck pain from degenerative arthritis and prior surgery presented with MSSA bacteremia likely related to a large fluid collection in the right hemithorax that could be a lung abscess or empyema.  There was a question of endocarditis but TEE has ruled out vegetation.  The patient is clinically stable and afebrile with a normal white blood cell count but a CT scan of the abdomen shows a persistent large fluid collection in the right chest with the pigtail catheter sitting within the fluid.  This could be a lung abscess or empyema but either way I think it needs to be completely drained to prevent further sepsis.  It may be possible drain this with thoracoscopy and drain placement but could also require a small thoracotomy.  I discussed  the CT scan results with him and the operative treatment.  I discussed alternatives, benefits, and risk including but not limited to bleeding, blood transfusion, persistent infection, persistent pleural space problems, injury to the lung,  prolonged air leak, and need for further procedures and he understands and agrees to proceed.  I will schedule this for Monday, 12/05/2018.   I spent 40 minutes performing this consultation and > 50% of this time was spent face to face counseling and coordinating the care of this patient's right empyema or lung abscess.  Fernande Boyden [place] 12/03/2018, 2:16 PM

## 2018-12-03 NOTE — Plan of Care (Signed)
Acute rehabilitation goals established. 

## 2018-12-03 NOTE — Evaluation (Signed)
Physical Therapy Evaluation Patient Details Name: Randy Page MRN: [id number] DOB: [birth year] Today's Date: 12/03/2018   History of Present Illness  Randy Page 63 y.o. male with medical history significant for hypertension, hyperlipidemia, recent GI bleed due to duodenal ulcer, anemia, chronic neck pain status post cervical fusion who is transferred to [facility] hospital from [facility] for further management of right-sided empyema and MSSA bacteremia.  Clinical Impression  Randy Page admitted with above complications. Randy Page currently with functional limitations due to the deficits listed below (see Randy Page Problem List). Became orthostatic and symptomatic with dizziness upon standing (BP to 90/65 from supine 132/61) Symptoms resolved however BP remains lowered 96/74 (RN notified.) limited assessment due to drop in BP today however Randy Page demonstrates good general functional mobility. He reports several falls at home as a result of feeling light headed in the past month. Randy Page will benefit from skilled Randy Page to increase their independence and safety with mobility to allow discharge to the venue listed below.       Follow Up Recommendations Home health Randy Page(Pending progress, may not require HH services.)    Equipment Recommendations  None recommended by Randy Page    Recommendations for Other Services       Precautions / Restrictions Precautions Precautions: Other (comment)(chest tube) Restrictions Weight Bearing Restrictions: No      Mobility  Bed Mobility Overal bed mobility: Modified Independent             General bed mobility comments: Extra time, assist with lines/leads only. Physically able to achieve EOB sitting.  Transfers Overall transfer level: Needs assistance Equipment used: None Transfers: Sit to/from Stand Sit to Stand: Min guard         General transfer comment: Close guard for safety, assist with lines and chest tube. Slow to rise, slightly unsteady. Developed lightheadedness  after approx 45 sec and required sitting down. BP with significant drop to 90/65. Symptoms resolved.  Ambulation/Gait             General Gait Details: Deferred due to drop in BP  Stairs            Wheelchair Mobility    Modified Rankin (Stroke Patients Only)       Balance Overall balance assessment: Needs assistance Sitting-balance support: No upper extremity supported;Feet supported Sitting balance-Leahy Scale: Normal     Standing balance support: No upper extremity supported Standing balance-Leahy Scale: Good Standing balance comment: Slight sway in standing. no over loss of balance though.                             Pertinent Vitals/Pain Pain Assessment: Faces Faces Pain Scale: Hurts a little bit Pain Location: chest tube site and buttocks Pain Descriptors / Indicators: Operative site guarding Pain Intervention(s): Limited activity within patient's tolerance    Home Living Family/patient expects to be discharged to:: Private residence Living Arrangements: Spouse/significant other(Girlfriend of 20 years) Available Help at Discharge: Family;Available 24 hours/day Type of Home: Apartment Home Access: Stairs to enter Entrance Stairs-Rails: Lawyer of Steps: 16 Home Layout: (Top floor of apt.) Home Equipment: Cane - single point;Cane - quad;Bedside commode(3 in 1 does not fit in tub.)      Prior Function Level of Independence: Independent with assistive device(s)         Comments: Uses cane for community ambulation. Several falls lately.     Hand Dominance   Dominant Hand: Left    Extremity/Trunk  Assessment   Upper Extremity Assessment Upper Extremity Assessment: Defer to OT evaluation    Lower Extremity Assessment Lower Extremity Assessment: Generalized weakness       Communication   Communication: No difficulties  Cognition Arousal/Alertness: Awake/alert Behavior During Therapy: WFL for tasks  assessed/performed Overall Cognitive Status: Within Functional Limits for tasks assessed                                        General Comments General comments (skin integrity, edema, etc.): Orthostatic, symptomatic upon standing. SpO2 95% on room air. HR around 99 during session.    Exercises     Assessment/Plan    Randy Page Assessment Patient needs continued Randy Page services  Randy Page Problem List Decreased strength;Decreased activity tolerance;Decreased balance;Decreased mobility;Decreased knowledge of use of DME;Cardiopulmonary status limiting activity       Randy Page Treatment Interventions DME instruction;Stair training;Gait training;Functional mobility training;Therapeutic activities;Therapeutic exercise;Balance training;Neuromuscular re-education;Patient/family education    Randy Page Goals (Current goals can be found in the Care Plan section)  Acute Rehab Randy Page Goals Patient Stated Goal: Get well Randy Page Goal Formulation: With patient Time For Goal Achievement: 12/17/18 Potential to Achieve Goals: Good    Frequency Min 3X/week   Barriers to discharge   flight of stairs    Co-evaluation               AM-PAC Randy Page "6 Clicks" Mobility  Outcome Measure Help needed turning from your back to your side while in a flat bed without using bedrails?: None Help needed moving from lying on your back to sitting on the side of a flat bed without using bedrails?: None Help needed moving to and from a bed to a chair (including a wheelchair)?: A Little Help needed standing up from a chair using your arms (e.g., wheelchair or bedside chair)?: A Little Help needed to walk in hospital room?: A Little Help needed climbing 3-5 steps with a railing? : A Lot 6 Click Score: 19    End of Session Equipment Utilized During Treatment: Gait belt Activity Tolerance: Treatment limited secondary to medical complications (Comment)(Drop in BP) Patient left: in bed;with call bell/phone within reach Nurse  Communication: Other (comment)(drop in BP) Randy Page Visit Diagnosis: Unsteadiness on feet (R26.81);Muscle weakness (generalized) (M62.81);History of falling (Z91.81);Dizziness and giddiness (R42);Difficulty in walking, not elsewhere classified (R26.2)    Time: [id number] Randy Page Time Calculation (min) (ACUTE ONLY): 31 min   Charges:   Randy Page Evaluation $Randy Page Eval Moderate Complexity: 1 Mod Randy Page Treatments $Therapeutic Activity: 8-22 mins        Randy Page, Randy Page, Randy Page  Randy Page 12/03/2018, 1:52 PM

## 2018-12-03 NOTE — Progress Notes (Signed)
Initial Nutrition Assessment  RD working remotely.  DOCUMENTATION CODES:   Not applicable  INTERVENTION:   -Increase Ensure Enlive po to TID, each supplement provides 350 kcal and 20 grams of protein -MVI with minerals daily  NUTRITION DIAGNOSIS:   Increased nutrient needs related to wound healing as evidenced by estimated needs.  GOAL:   Patient will meet greater than or equal to 90% of their needs  MONITOR:   PO intake, Supplement acceptance, Labs, Weight trends, Skin, I & O's  REASON FOR ASSESSMENT:   Consult Assessment of nutrition requirement/status  ASSESSMENT:   Randy Page is a 63 y.o. male with medical history significant for hypertension, hyperlipidemia, recent GI bleed due to duodenal ulcer, anemia, chronic neck pain status post cervical fusion who is transferred to [facility] hospital from [facility] for further management of right-sided empyema and MSSA bacteremia.  Pt admitted with MSSA bacteremia due to rt sided empyema.   4/27-4/30- s/p EGD and clipping for anemia duodenal ulcer during [facility] admission 5/27- s/p IR thoracostomy   Reviewed I/O's: -625 ml x 24 hours  UOP: 875 ml x 24 hours  RD attempted to speak with pt via phone, however, no answer. RD unable to obtain further nutrition-related history at this time.   Pt awaiting CVTS evaluation.   Reviewed wt hx; noted pt has experienced a 25% wt loss over the past 14 months, which is significant for time frame.  Pt was just advanced to a regular diet this morning, so not meal intake data to assess at this time. MD ordered Ensure supplements.   Given significant wt loss, unstageable pressure injury, and recent hospitalizations, suspect pt with malnutrition, however, unable to identify at this time without further history and completion of nutrition-focused physical exam. Pt would greatly benefit from addition of nutritional supplements to optimize nutritional status and meet increased  nutrient needs.   Labs reviewed: K: 3.4.   NUTRITION - FOCUSED PHYSICAL EXAM:    Most Recent Value  Orbital Region  Unable to assess  Upper Arm Region  Unable to assess  Thoracic and Lumbar Region  Unable to assess  Buccal Region  Unable to assess  Temple Region  Unable to assess  Clavicle Bone Region  Unable to assess  Clavicle and Acromion Bone Region  Unable to assess  Scapular Bone Region  Unable to assess  Dorsal Hand  Unable to assess  Patellar Region  Unable to assess  Anterior Thigh Region  Unable to assess  Posterior Calf Region  Unable to assess  Edema (RD Assessment)  Unable to assess  Hair  Unable to assess  Eyes  Unable to assess  Mouth  Unable to assess  Skin  Unable to assess  Nails  Unable to assess       Diet Order:   Diet Order            Diet regular Room service appropriate? No; Fluid consistency: Thin  Diet effective now              EDUCATION NEEDS:   No education needs have been identified at this time  Skin:  Skin Assessment: Skin Integrity Issues: Skin Integrity Issues:: Unstageable Unstageable: rt buttocks  Last BM:  11/29/18  Height:   Ht Readings from Last 1 Encounters:  12/02/18 5\' 9"  (1.753 m)    Weight:   Wt Readings from Last 1 Encounters:  12/03/18 58.7 kg    Ideal Body Weight:  72.7 kg  BMI:  Body mass index is 19.09 kg/m.  Estimated Nutritional Needs:   Kcal:  2150-2350  Protein:  115-130 grams  Fluid:  > 2.2 L    Takima Encina A. Mayford KnifeWilliams, RD, LDN, CDCES Registered Dietitian II Certified Diabetes Care and Education Specialist Pager: [phone] After hours Pager: [phone]

## 2018-12-03 NOTE — Progress Notes (Signed)
PROGRESS NOTE    Randy Page   ZOX:[id number]  DOB: Dec 03, 1955  DOA: 12/02/2018 PCP: Karle Plumber, MD   Brief Narrative:  Randy Page 63 y.o. male with medical history significant for hypertension, hyperlipidemia, recent GI bleed due to duodenal ulcer, anemia, chronic neck pain status post cervical fusion who is transferred to [facility] hospital from [facility] for further management of right-sided empyema and MSSA bacteremia.   Subjective: Feels uncomfortable. He has pain in the site of his pleural cath. Has a good appetite. No vomiting, constipation or diarrhea. He is not sure he wants any surgery on his chest.   No other complaints.     Assessment & Plan:   Principal Problem:   MSSA bacteremia - underwent chest tube on 5/27 - plan for 3 wks of Cefazolin and Rifampin - f/u repeat blood cultures and pleural fluid cultures - CXR 5/30> no significant change in residual pleural and parenchymal disease at the right base - transferred to [facility] for CT  Surgery to evaluate today  Active Problems:   Hypotension with h/o Essential hypertension - holding antihypertensives    Hyperlipidemia - cont Atorvastatin     Chronic pain in neck and back - s/p cervical fusion - cont Oxy IR 15 mg QID PRN and Gabapentin    Duodenal ulcer with GI bleed - cont Protonix  Anemia- normocytic - check anemia panel    Decubitus ulcer - cont dressing changes and f/u on wound care eval  Hypoalbuminemia - in context of current illness - have added Ensure BID and requested an nutrition eval  Elevated INR - will order vit K x 3 days and f/u INR  Nicotine abuse - counseled to quit- he states he would like to but his girlfriend also smokes - start Nicotine patch   Time spent in minutes: 35 min DVT prophylaxis: SCDs Code Status: Full code Family Communication:  Disposition Plan: add PT eval Consultants:   CT surgery Procedures:   Chest tube Antimicrobials:   Anti-infectives (From admission, onward)   Start     Dose/Rate Route Frequency Ordered Stop   12/02/18 2330  ceFAZolin (ANCEF) IVPB 2g/100 mL premix     2 g 200 mL/hr over 30 Minutes Intravenous Every 8 hours 12/02/18 2322     12/02/18 2330  rifampin (RIFADIN) capsule 300 mg     300 mg Oral Every 12 hours 12/02/18 2322         Objective: Vitals:   12/02/18 2203 12/02/18 2208 12/03/18 0017 12/03/18 0300  BP: (!) 88/63 98/78 (!) 94/53 112/78  Pulse: (!) 102 (!) 101 81 84  Resp: (!) Temp: 99.2 F (37.3 C)  98 F (36.7 C) 98.3 F (36.8 C)  TempSrc: Oral  Oral Oral  SpO2: 99% 98% 98% 97%  Weight:  59.8 kg  58.7 kg  Height:   (1.753 m)      Intake/Output Summary (Last 24 hours) at 12/03/2018 0737 Last data filed at 12/03/2018 0300 Gross per 24 hour  Intake 250 ml  Output 875 ml  Net -625 ml   Filed Weights   12/02/18 2208 12/03/18 0300  Weight: 59.8 kg 58.7 kg    Examination: General exam: Appears comfortable  HEENT: PERRLA, oral mucosa moist, no sclera icterus or thrush Respiratory system: Crackles in RLL. Respiratory effort normal. Chest tube in place Cardiovascular system: S1 & S2 heard, RRR.   Gastrointestinal system: Abdomen soft, non-tender, nondistended. Normal bowel sounds.  Central nervous system: Alert and oriented. No focal neurological deficits. Extremities: No cyanosis, clubbing or edema Skin: No rashes or ulcers Psychiatry:  Mood & affect appropriate.   Data Reviewed: I have personally reviewed following labs and imaging studies  CBC: Recent Labs  Lab 12/02/18 2341 12/03/18 0217  WBC 4.2 4.8  NEUTROABS 2.6  --   HGB 7.7* 7.4*  HCT 25.6* 24.6*  MCV 95.9 96.1  PLT 177 181   Basic Metabolic Panel: Recent Labs  Lab 12/02/18 2341 12/03/18 0217  NA 134* 136  K 3.4* 3.4*  CL 103 102  CO2 28 25  GLUCOSE 103* 93  BUN 9 8  CREATININE 0.60* 0.60*  CALCIUM 7.2* 7.4*   GFR: Estimated Creatinine Clearance: 78.5 mL/min (A) (by  C-G formula based on SCr of 0.6 mg/dL (L)). Liver Function Tests: Recent Labs  Lab 12/02/18 2341 12/03/18 0217  AST 16 13*  ALT 7 8  ALKPHOS 99 95  BILITOT 0.7 0.7  PROT 5.5* 5.3*  ALBUMIN 1.3* 1.3*   No results for input(s): LIPASE, AMYLASE in the last 168 hours. No results for input(s): AMMONIA in the last 168 hours. Coagulation Profile: Recent Labs  Lab 12/03/18 0217  INR 1.5*   Cardiac Enzymes: No results for input(s): CKTOTAL, CKMB, CKMBINDEX, TROPONINI in the last 168 hours. BNP (last 3 results) No results for input(s): PROBNP in the last 8760 hours. HbA1C: No results for input(s): HGBA1C in the last 72 hours. CBG: No results for input(s): GLUCAP in the last 168 hours. Lipid Profile: No results for input(s): CHOL, HDL, LDLCALC, TRIG, CHOLHDL, LDLDIRECT in the last 72 hours. Thyroid Function Tests: No results for input(s): TSH, T4TOTAL, FREET4, T3FREE, THYROIDAB in the last 72 hours. Anemia Panel: No results for input(s): VITAMINB12, FOLATE, FERRITIN, TIBC, IRON, RETICCTPCT in the last 72 hours. Urine analysis: No results found for: COLORURINE, APPEARANCEUR, LABSPEC, PHURINE, GLUCOSEU, HGBUR, BILIRUBINUR, KETONESUR, PROTEINUR, UROBILINOGEN, NITRITE, LEUKOCYTESUR Sepsis Labs: @LABRCNTIP (procalcitonin:4,lacticidven:4) ) Recent Results (from the past 240 hour(s))  SARS Coronavirus 2 (CEPHEID - Performed in [facility] hospital lab), Hosp Order     Status: None   Collection Time: 12/03/18  1:40 AM  Result Value Ref Range Status   SARS Coronavirus 2 NEGATIVE NEGATIVE Final    Comment: (NOTE) If result is NEGATIVE SARS-CoV-2 target nucleic acids are NOT DETECTED. The SARS-CoV-2 RNA is generally detectable in upper and lower  respiratory specimens during the acute phase of infection. The lowest  concentration of SARS-CoV-2 viral copies this assay can detect is 250  copies / mL. A negative result does not preclude SARS-CoV-2 infection  and should not be used as the  sole basis for treatment or other  patient management decisions.  A negative result may occur with  improper specimen collection / handling, submission of specimen other  than nasopharyngeal swab, presence of viral mutation(s) within the  areas targeted by this assay, and inadequate number of viral copies  (<250 copies / mL). A negative result must be combined with clinical  observations, patient history, and epidemiological information. If result is POSITIVE SARS-CoV-2 target nucleic acids are DETECTED. The SARS-CoV-2 RNA is generally detectable in upper and lower  respiratory specimens dur ing the acute phase of infection.  Positive  results are indicative of active infection with SARS-CoV-2.  Clinical  correlation with patient history and other diagnostic information is  necessary to determine patient infection status.  Positive results do  not rule out bacterial infection or co-infection with other viruses. If  result is PRESUMPTIVE POSTIVE SARS-CoV-2 nucleic acids MAY BE PRESENT.   A presumptive positive result was obtained on the submitted specimen  and confirmed on repeat testing.  While 2019 novel coronavirus  (SARS-CoV-2) nucleic acids may be present in the submitted sample  additional confirmatory testing may be necessary for epidemiological  and / or clinical management purposes  to differentiate between  SARS-CoV-2 and other Sarbecovirus currently known to infect humans.  If clinically indicated additional testing with an alternate test  methodology [phone]([id number]) is advised. The SARS-CoV-2 RNA is generally  detectable in upper and lower respiratory sp ecimens during the acute  phase of infection. The expected result is Negative. Fact Sheet for Patients:  BoilerBrush.com.cyhttps://www.fda.gov/media/136312/download Fact Sheet for Healthcare Providers: https://pope.com/https://www.fda.gov/media/136313/download This test is not yet approved or cleared by the [place] FDA and has been authorized for detection  and/or diagnosis of SARS-CoV-2 by FDA under an Emergency Use Authorization (EUA).  This EUA will remain in effect (meaning this test can be used) for the duration of the COVID-19 declaration under Section 564(b)(1) of the Act, 21 U.S.C. section 360bbb-3(b)(1), unless the authorization is terminated or revoked sooner. Performed at [facility], [address] [place], [place] [address]          Radiology Studies: Dg Chest 2 View  Result Date: 12/03/2018 CLINICAL DATA:  Empyema EXAM: CHEST - 2 VIEW COMPARISON:  12/01/2018, CT 11/28/2018, radiograph 11/27/2018 FINDINGS: Right basilar drainage catheter. No change in residual pleural and parenchymal disease at the right base. Left lung is clear. IMPRESSION: No significant interval change in right basilar drainage catheter and residual pleural and parenchymal disease at the right base. Electronically Signed   By: Jasmine PangKim  Fujinaga M.D.   On: 12/03/2018 04:09      Scheduled Meds: . atorvastatin  20 mg Oral QHS  . diazepam  2 mg Oral QHS  . gabapentin  600 mg Oral QID  . pantoprazole  40 mg Oral Daily  . rifampin  300 mg Oral Q12H  . sodium chloride flush  3 mL Intravenous Q12H   Continuous Infusions: .  ceFAZolin (ANCEF) IV 2 g (12/03/18 0646)     LOS: 1 day      Randy CantorSaima Stevi Hollinshead, MD [facility] Pager: [email or web address] Password [email or web address] 12/03/2018, 7:37 AM

## 2018-12-04 LAB — CBC
HCT: 28.3 % — ABNORMAL LOW (ref 39.0–52.0)
Hemoglobin: 8.5 g/dL — ABNORMAL LOW (ref 13.0–17.0)
MCH: 28.9 pg (ref 26.0–34.0)
MCHC: 30 g/dL (ref 30.0–36.0)
MCV: 96.3 fL (ref 80.0–100.0)
Platelets: 239 10*3/uL (ref 150–400)
RBC: 2.94 MIL/uL — ABNORMAL LOW (ref 4.22–5.81)
RDW: 18.5 % — ABNORMAL HIGH (ref 11.5–15.5)
WBC: 4.1 10*3/uL (ref 4.0–10.5)
nRBC: 0 % (ref 0.0–0.2)

## 2018-12-04 LAB — PROTIME-INR
INR: 1.4 — ABNORMAL HIGH (ref 0.8–1.2)
Prothrombin Time: 16.6 seconds — ABNORMAL HIGH (ref 11.4–15.2)

## 2018-12-04 LAB — MRSA PCR SCREENING: MRSA by PCR: NEGATIVE

## 2018-12-04 LAB — ABO/RH: ABO/RH(D): O POS

## 2018-12-04 LAB — HIV ANTIBODY (ROUTINE TESTING W REFLEX): HIV Screen 4th Generation wRfx: NONREACTIVE

## 2018-12-04 LAB — VITAMIN B12: Vitamin B-12: 919 pg/mL — ABNORMAL HIGH (ref 180–914)

## 2018-12-04 LAB — FOLATE: Folate: 7.9 ng/mL (ref >5.9)

## 2018-12-04 MED ORDER — SODIUM CHLORIDE 0.9 % IV BOLUS
1000.0000 mL | Freq: Once | INTRAVENOUS | Status: AC
  Administered 2018-12-04: 1000 mL via INTRAVENOUS

## 2018-12-04 MED ORDER — DOXEPIN HCL 100 MG PO CAPS
100.0000 mg | ORAL_CAPSULE | Freq: Once | ORAL | Status: AC
  Administered 2018-12-04: 01:00:00 100 mg via ORAL
  Filled 2018-12-04: qty 1

## 2018-12-04 MED ORDER — POTASSIUM CHLORIDE CRYS ER 20 MEQ PO TBCR
40.0000 meq | EXTENDED_RELEASE_TABLET | ORAL | Status: AC
  Administered 2018-12-04 (×2): 40 meq via ORAL
  Filled 2018-12-04 (×2): qty 2

## 2018-12-04 MED ORDER — SODIUM CHLORIDE 0.9 % IV SOLN
INTRAVENOUS | Status: DC
  Administered 2018-12-04 – 2018-12-05 (×2): via INTRAVENOUS

## 2018-12-04 MED ORDER — SODIUM CHLORIDE 0.9% IV SOLUTION: Freq: Once | INTRAVENOUS | Status: DC

## 2018-12-04 MED ORDER — TIZANIDINE HCL 4 MG PO TABS
4.0000 mg | ORAL_TABLET | Freq: Every day | ORAL | Status: DC
  Administered 2018-12-04 – 2018-12-13 (×10): 4 mg via ORAL
  Filled 2018-12-04 (×10): qty 1

## 2018-12-04 NOTE — Progress Notes (Addendum)
PROGRESS NOTE    Randy Page   YNW:[id number]  DOB: 1955-08-15  DOA: 12/02/2018 PCP: Karle Plumber, MD   Brief Narrative:  Randy Page 63 y.o. male with medical history significant for hypertension, hyperlipidemia, recent GI bleed due to duodenal ulcer, anemia, chronic neck pain status post cervical fusion who is transferred to [facility] hospital from [facility] for further management of right-sided empyema and MSSA bacteremia.   Subjective: He has no complaints this AM     Assessment & Plan:   Principal Problem:   MSSA bacteremia, empyema, acute respiratory failure - underwent chest tube on 5/27 - plan for 3 wks of Cefazolin and Rifampin  - f/u repeat blood cultures and pleural fluid cultures - CXR 5/30> no significant change in residual pleural and parenchymal disease at the right base - transferred to [facility] for CT surgery to evaluate- Dr Laneta Simmers has recommended a VATS procedure - wean O2 as able  Active Problems:   Hypotension with h/o Essential hypertension - holding antihypertensives - NOTE: PT note from yesterday mentions orthostatic hypotension BP drop to 90/65 from 132/61 and he became lightheaded after 45 sec.  - check orthostatics now.  Addendum: orthostatics positive- will give a NS bolus and repeat orthostatics    Hyperlipidemia - cont Atorvastatin  Hypokalemia -replacing      Chronic pain in neck and back - s/p cervical fusion - cont Oxy IR 15 mg Q4 hrs PRN and Gabapentin    Duodenal ulcer with GI bleed s/p clipping 4/20 - cont Protonix  Anemia- normocytic -  Hb 7.4- no prior Hb to compare with -  anemia panel consistent with AOCD - repeat Hb today    Decubitus ulcer - cont dressing changes and f/u on wound care eval  Hypoalbuminemia - in context of current illness - have added Ensure BID and requested an nutrition eval  Elevated INR -  cont vit K x 3 days and f/u INR  Nicotine abuse - counseled to quit- he states he would  like to but his girlfriend also smokes - cont Nicotine patch   Time spent in minutes: 35 min DVT prophylaxis: SCDs Code Status: Full code Family Communication:  Disposition Plan: HHPT recommended Consultants:   CT surgery Procedures:   Chest tube Antimicrobials:  Anti-infectives (From admission, onward)   Start     Dose/Rate Route Frequency Ordered Stop   12/02/18 2330  ceFAZolin (ANCEF) IVPB 2g/100 mL premix     2 g 200 mL/hr over 30 Minutes Intravenous Every 8 hours 12/02/18 2322     12/02/18 2330  rifampin (RIFADIN) capsule 300 mg     300 mg Oral Every 12 hours 12/02/18 2322         Objective: Vitals:   12/03/18 2003 12/04/18 0010 12/04/18 0354 12/04/18 0836  BP: 129/87 (!) 146/72 93/60 127/68  Pulse: 98 94 98 84  Resp: 20 20 20 19   Temp: 98.3 F (36.8 C) 98.2 F (36.8 C) 98 F (36.7 C)   TempSrc: Oral Oral Oral   SpO2: 96% 94% 94% 94%  Weight:   57.2 kg   Height:        Intake/Output Summary (Last 24 hours) at 12/04/2018 0912 Last data filed at 12/04/2018 0357 Gross per 24 hour  Intake 320 ml  Output 2125 ml  Net -1805 ml   Filed Weights   12/02/18 2208 12/03/18 0300 12/04/18 0354  Weight: 59.8 kg 58.7 kg 57.2 kg    Examination: General exam:  Appears comfortable  HEENT: PERRLA, oral mucosa moist, no sclera icterus or thrush Respiratory system: Crackles RLL. Respiratory effort normal. Pulse ox 97% on 2 L Cardiovascular system: S1 & S2 heard,  No murmurs  Gastrointestinal system: Abdomen soft, non-tender, nondistended. Normal bowel sounds   Central nervous system: Alert and oriented. No focal neurological deficits. Extremities: No cyanosis, clubbing or edema Skin: No rashes or ulcers Psychiatry:  Mood & affect appropriate.   Data Reviewed: I have personally reviewed following labs and imaging studies  CBC: Recent Labs  Lab 12/02/18 2341 12/03/18 0217  WBC 4.2 4.8  NEUTROABS 2.6  --   HGB 7.7* 7.4*  HCT 25.6* 24.6*  MCV 95.9 96.1  PLT 177  181   Basic Metabolic Panel: Recent Labs  Lab 12/02/18 2341 12/03/18 0217  NA 134* 136  K 3.4* 3.4*  CL 103 102  CO2 28 25  GLUCOSE 103* 93  BUN 9 8  CREATININE 0.60* 0.60*  CALCIUM 7.2* 7.4*   GFR: Estimated Creatinine Clearance: 76.5 mL/min (A) (by C-G formula based on SCr of 0.6 mg/dL (L)). Liver Function Tests: Recent Labs  Lab 12/02/18 2341 12/03/18 0217  AST 16 13*  ALT 7 8  ALKPHOS 99 95  BILITOT 0.7 0.7  PROT 5.5* 5.3*  ALBUMIN 1.3* 1.3*   No results for input(s): LIPASE, AMYLASE in the last 168 hours. No results for input(s): AMMONIA in the last 168 hours. Coagulation Profile: Recent Labs  Lab 12/03/18 0217 12/04/18 0342  INR 1.5* 1.4*   Cardiac Enzymes: No results for input(s): CKTOTAL, CKMB, CKMBINDEX, TROPONINI in the last 168 hours. BNP (last 3 results) No results for input(s): PROBNP in the last 8760 hours. HbA1C: No results for input(s): HGBA1C in the last 72 hours. CBG: No results for input(s): GLUCAP in the last 168 hours. Lipid Profile: No results for input(s): CHOL, HDL, LDLCALC, TRIG, CHOLHDL, LDLDIRECT in the last 72 hours. Thyroid Function Tests: No results for input(s): TSH, T4TOTAL, FREET4, T3FREE, THYROIDAB in the last 72 hours. Anemia Panel: Recent Labs    12/03/18 1050 12/04/18 0342  VITAMINB12  --  919*  FOLATE  --  7.9  FERRITIN 244  --   TIBC 134*  --   IRON 21*  --   RETICCTPCT 1.9  --    Urine analysis: No results found for: COLORURINE, APPEARANCEUR, LABSPEC, PHURINE, GLUCOSEU, HGBUR, BILIRUBINUR, KETONESUR, PROTEINUR, UROBILINOGEN, NITRITE, LEUKOCYTESUR Sepsis Labs: @LABRCNTIP (procalcitonin:4,lacticidven:4) ) Recent Results (from the past 240 hour(s))  SARS Coronavirus 2 (CEPHEID - Performed in [facility] hospital lab), Hosp Order     Status: None   Collection Time: 12/03/18  1:40 AM  Result Value Ref Range Status   SARS Coronavirus 2 NEGATIVE NEGATIVE Final    Comment: (NOTE) If result is NEGATIVE SARS-CoV-2  target nucleic acids are NOT DETECTED. The SARS-CoV-2 RNA is generally detectable in upper and lower  respiratory specimens during the acute phase of infection. The lowest  concentration of SARS-CoV-2 viral copies this assay can detect is 250  copies / mL. A negative result does not preclude SARS-CoV-2 infection  and should not be used as the sole basis for treatment or other  patient management decisions.  A negative result may occur with  improper specimen collection / handling, submission of specimen other  than nasopharyngeal swab, presence of viral mutation(s) within the  areas targeted by this assay, and inadequate number of viral copies  (<250 copies / mL). A negative result must be combined with clinical  observations, patient history, and epidemiological information. If result is POSITIVE SARS-CoV-2 target nucleic acids are DETECTED. The SARS-CoV-2 RNA is generally detectable in upper and lower  respiratory specimens dur ing the acute phase of infection.  Positive  results are indicative of active infection with SARS-CoV-2.  Clinical  correlation with patient history and other diagnostic information is  necessary to determine patient infection status.  Positive results do  not rule out bacterial infection or co-infection with other viruses. If result is PRESUMPTIVE POSTIVE SARS-CoV-2 nucleic acids MAY BE PRESENT.   A presumptive positive result was obtained on the submitted specimen  and confirmed on repeat testing.  While 2019 novel coronavirus  (SARS-CoV-2) nucleic acids may be present in the submitted sample  additional confirmatory testing may be necessary for epidemiological  and / or clinical management purposes  to differentiate between  SARS-CoV-2 and other Sarbecovirus currently known to infect humans.  If clinically indicated additional testing with an alternate test  methodology [phone]) is advised. The SARS-CoV-2 RNA is generally  detectable in upper and lower  respiratory sp ecimens during the acute  phase of infection. The expected result is Negative. Fact Sheet for Patients:  BoilerBrush.com.cy Fact Sheet for Healthcare Providers: https://pope.com/ This test is not yet approved or cleared by the [place] FDA and has been authorized for detection and/or diagnosis of SARS-CoV-2 by FDA under an Emergency Use Authorization (EUA).  This EUA will remain in effect (meaning this test can be used) for the duration of the COVID-19 declaration under Section 564(b)(1) of the Act, 21 U.S.C. section 360bbb-3(b)(1), unless the authorization is terminated or revoked sooner. Performed at [facility], [address] [place], [place] [address]          Radiology Studies: Dg Chest 2 View  Result Date: 12/03/2018 CLINICAL DATA:  Empyema EXAM: CHEST - 2 VIEW COMPARISON:  12/01/2018, CT 11/28/2018, radiograph 11/27/2018 FINDINGS: Right basilar drainage catheter. No change in residual pleural and parenchymal disease at the right base. Left lung is clear. IMPRESSION: No significant interval change in right basilar drainage catheter and residual pleural and parenchymal disease at the right base. Electronically Signed   By: Jasmine Pang M.D.   On: 12/03/2018 04:09      Scheduled Meds:  atorvastatin  20 mg Oral QHS   diazepam  2 mg Oral QHS   feeding supplement (ENSURE ENLIVE)  237 mL Oral TID BM   gabapentin  600 mg Oral QID   multivitamin with minerals  1 tablet Oral Daily   nicotine  14 mg Transdermal Daily   pantoprazole  40 mg Oral Daily   phytonadione  5 mg Oral Daily   potassium chloride  40 mEq Oral Q4H   rifampin  300 mg Oral Q12H   sodium chloride flush  3 mL Intravenous Q12H   Continuous Infusions:   ceFAZolin (ANCEF) IV 2 g (12/04/18 0609)     LOS: 2 days      Calvert Cantor, MD [facility] Pager: [email or web address] Password [email or web address] 12/04/2018, 9:12 AM

## 2018-12-04 NOTE — Progress Notes (Signed)
Ted hose not ordered. Patient refused to wear them when I explained they had been ordered even after explaining the benefits for use.   Ernestina Columbia, RN

## 2018-12-04 NOTE — Progress Notes (Signed)
Procedure(s) (LRB): RIGHT VIDEO ASSISTED THORACOSCOPY (VATS)OR THORACOTOMY/EMPYEMA/LUNG ABSCESS (Right) Subjective: Complains of his chronic neck pain and the usual complexities of medication administration in the hospital.  Objective: Vital signs in last 24 hours: Temp:  [97.7 F (36.5 C)-98.3 F (36.8 C)] 98 F (36.7 C) (05/31 0354) Pulse Rate:  [84-109] 109 (05/31 1040) Cardiac Rhythm: Normal sinus rhythm (05/31 0700) Resp:  [15-20] 20 (05/31 1040) BP: (93-146)/(60-87) 127/68 (05/31 0836) SpO2:  [94 %-97 %] 97 % (05/31 1040) Weight:  [57.2 kg] 57.2 kg (05/31 0354)  Hemodynamic parameters for last 24 hours:    Intake/Output from previous day: 05/30 0701 - 05/31 0700 In: 320 [P.O.:220; IV Piggyback:100] Out: 2125 [Urine:1915; Chest Tube:210] Intake/Output this shift: Total I/O In: -  Out: 800 [Urine:800]  General appearance: alert and cooperative Heart: regular rate and rhythm, S1, S2 normal, no murmur Lungs: decreased BS right lower hemithorax.  Lab Results: Recent Labs    12/03/18 0217 12/04/18 1054  WBC 4.8 4.1  HGB 7.4* 8.5*  HCT 24.6* 28.3*  PLT 181 239   BMET:  Recent Labs    12/02/18 2341 12/03/18 0217  NA 134* 136  K 3.4* 3.4*  CL 103 102  CO2 28 25  GLUCOSE 103* 93  BUN 9 8  CREATININE 0.60* 0.60*  CALCIUM 7.2* 7.4*    PT/INR:  Recent Labs    12/04/18 0342  LABPROT 16.6*  INR 1.4*   ABG No results found for: PHART, HCO3, TCO2, ACIDBASEDEF, O2SAT CBG (last 3)  No results for input(s): GLUCAP in the last 72 hours.  Assessment/Plan:  Right lung abscess or empyema. Plan surgical drainage in the OR tomorrow afternoon. He has no further questions.  LOS: 2 days    Alleen Borne 12/04/2018

## 2018-12-05 ENCOUNTER — Inpatient Hospital Stay (HOSPITAL_COMMUNITY): Payer: Medicare Other | Admitting: Certified Registered Nurse Anesthetist

## 2018-12-05 ENCOUNTER — Encounter (HOSPITAL_COMMUNITY): Payer: Self-pay

## 2018-12-05 ENCOUNTER — Inpatient Hospital Stay (HOSPITAL_COMMUNITY): Payer: Medicare Other

## 2018-12-05 ENCOUNTER — Encounter (HOSPITAL_COMMUNITY)
Admission: AD | Disposition: A | Discharge status: Home Health | Payer: Self-pay | Source: Other Acute Inpatient Hospital | Attending: Internal Medicine

## 2018-12-05 DIAGNOSIS — B9561 Methicillin susceptible Staphylococcus aureus infection as the cause of diseases classified elsewhere: Secondary | ICD-10-CM

## 2018-12-05 DIAGNOSIS — Z981 Arthrodesis status: Secondary | ICD-10-CM

## 2018-12-05 DIAGNOSIS — G8929 Other chronic pain: Secondary | ICD-10-CM

## 2018-12-05 DIAGNOSIS — Z8719 Personal history of other diseases of the digestive system: Secondary | ICD-10-CM

## 2018-12-05 DIAGNOSIS — J869 Pyothorax without fistula: Secondary | ICD-10-CM

## 2018-12-05 DIAGNOSIS — L8990 Pressure ulcer of unspecified site, unspecified stage: Secondary | ICD-10-CM

## 2018-12-05 DIAGNOSIS — F17200 Nicotine dependence, unspecified, uncomplicated: Secondary | ICD-10-CM

## 2018-12-05 DIAGNOSIS — R7881 Bacteremia: Secondary | ICD-10-CM

## 2018-12-05 HISTORY — DX: Pressure ulcer of unspecified site, unspecified stage: L89.90

## 2018-12-05 HISTORY — DX: Pyothorax without fistula: J86.9

## 2018-12-05 HISTORY — PX: VIDEO ASSISTED THORACOSCOPY (VATS)/EMPYEMA: SHX6172

## 2018-12-05 HISTORY — PX: WOUND DEBRIDEMENT: SHX247

## 2018-12-05 LAB — CBC
HCT: 28 % — ABNORMAL LOW (ref 39.0–52.0)
Hemoglobin: 8.5 g/dL — ABNORMAL LOW (ref 13.0–17.0)
MCH: 28.9 pg (ref 26.0–34.0)
MCHC: 30.4 g/dL (ref 30.0–36.0)
MCV: 95.2 fL (ref 80.0–100.0)
Platelets: 248 10*3/uL (ref 150–400)
RBC: 2.94 MIL/uL — ABNORMAL LOW (ref 4.22–5.81)
RDW: 18.3 % — ABNORMAL HIGH (ref 11.5–15.5)
WBC: 5.8 10*3/uL (ref 4.0–10.5)
nRBC: 0 % (ref 0.0–0.2)

## 2018-12-05 LAB — BASIC METABOLIC PANEL
Anion gap: 7 (ref 5–15)
BUN: 6 mg/dL — ABNORMAL LOW (ref 8–23)
CO2: 25 mmol/L (ref 22–32)
Calcium: 8.3 mg/dL — ABNORMAL LOW (ref 8.9–10.3)
Chloride: 99 mmol/L (ref 98–111)
Creatinine, Ser: 0.57 mg/dL — ABNORMAL LOW (ref 0.61–1.24)
GFR calc Af Amer: >60 mL/min (ref >60)
GFR calc non Af Amer: >60 mL/min (ref >60)
Glucose, Bld: 81 mg/dL (ref 70–99)
Potassium: 4.3 mmol/L (ref 3.5–5.1)
Sodium: 131 mmol/L — ABNORMAL LOW (ref 135–145)

## 2018-12-05 LAB — GLUCOSE, CAPILLARY: Glucose-Capillary: 184 mg/dL — ABNORMAL HIGH (ref 70–99)

## 2018-12-05 LAB — PROTIME-INR
INR: 1.2 (ref 0.8–1.2)
Prothrombin Time: 15.1 seconds (ref 11.4–15.2)

## 2018-12-05 LAB — CORTISOL: Cortisol, Plasma: 9.7 ug/dL

## 2018-12-05 SURGERY — VIDEO ASSISTED THORACOSCOPY (VATS)/EMPYEMA
Anesthesia: General | Site: Chest | Laterality: Right

## 2018-12-05 MED ORDER — LACTATED RINGERS IV SOLN
INTRAVENOUS | Status: DC | PRN
  Administered 2018-12-05: 15:00:00 via INTRAVENOUS

## 2018-12-05 MED ORDER — MIDAZOLAM HCL 5 MG/5ML IJ SOLN
INTRAMUSCULAR | Status: DC | PRN
  Administered 2018-12-05: 2 mg via INTRAVENOUS

## 2018-12-05 MED ORDER — LORAZEPAM 0.5 MG PO TABS
0.5000 mg | ORAL_TABLET | Freq: Once | ORAL | Status: AC
  Administered 2018-12-05: 0.5 mg via ORAL
  Filled 2018-12-05: qty 1

## 2018-12-05 MED ORDER — SODIUM CHLORIDE 0.9% FLUSH: 9.0000 mL | INTRAVENOUS | Status: DC | PRN

## 2018-12-05 MED ORDER — 0.9 % SODIUM CHLORIDE (POUR BTL) OPTIME
TOPICAL | Status: DC | PRN
  Administered 2018-12-05: 2000 mL

## 2018-12-05 MED ORDER — LACTATED RINGERS IV SOLN
INTRAVENOUS | Status: DC
  Administered 2018-12-05: 14:00:00 via INTRAVENOUS

## 2018-12-05 MED ORDER — DIAZEPAM 2 MG PO TABS
2.0000 mg | ORAL_TABLET | Freq: Once | ORAL | Status: AC
  Administered 2018-12-05: 2 mg via ORAL
  Filled 2018-12-05: qty 1

## 2018-12-05 MED ORDER — ZOLPIDEM TARTRATE 5 MG PO TABS
5.0000 mg | ORAL_TABLET | Freq: Every evening | ORAL | Status: DC | PRN
  Administered 2018-12-05 – 2018-12-13 (×5): 5 mg via ORAL
  Filled 2018-12-05 (×5): qty 1

## 2018-12-05 MED ORDER — DEXTROSE IN LACTATED RINGERS 5 % IV SOLN
INTRAVENOUS | Status: DC
  Administered 2018-12-05 – 2018-12-09 (×3): via INTRAVENOUS

## 2018-12-05 MED ORDER — METOCLOPRAMIDE HCL 5 MG/ML IJ SOLN
10.0000 mg | Freq: Four times a day (QID) | INTRAMUSCULAR | Status: AC
  Administered 2018-12-05 – 2018-12-06 (×4): 10 mg via INTRAVENOUS
  Filled 2018-12-05 (×4): qty 2

## 2018-12-05 MED ORDER — MORPHINE SULFATE 2 MG/ML IV SOLN
INTRAVENOUS | Status: DC
  Administered 2018-12-05: 20:00:00 via INTRAVENOUS
  Administered 2018-12-05: 19.5 mg via INTRAVENOUS
  Administered 2018-12-05: 1.5 mg via INTRAVENOUS
  Administered 2018-12-06 (×2): 18 mg via INTRAVENOUS
  Administered 2018-12-06: 30 mg via INTRAVENOUS
  Administered 2018-12-06: 3.62 mg via INTRAVENOUS
  Administered 2018-12-06: 09:00:00 2 mg via INTRAVENOUS
  Administered 2018-12-06: 7.5 mg via INTRAVENOUS
  Administered 2018-12-06: 2 mg via INTRAVENOUS
  Administered 2018-12-07: 13.5 mg via INTRAVENOUS
  Administered 2018-12-07: 16.7 mg via INTRAVENOUS
  Administered 2018-12-07: 14:00:00 2 mL via INTRAVENOUS
  Administered 2018-12-07 – 2018-12-08 (×2): 10.5 mg via INTRAVENOUS
  Administered 2018-12-08: 11:00:00 via INTRAVENOUS
  Administered 2018-12-08: 10.5 mg via INTRAVENOUS
  Administered 2018-12-08: 20.17 mg via INTRAVENOUS
  Administered 2018-12-08: 13.5 mg via INTRAVENOUS
  Administered 2018-12-08: 3 mg via INTRAVENOUS
  Administered 2018-12-09: 4.5 mg via INTRAVENOUS
  Administered 2018-12-09: 6 mg via INTRAVENOUS
  Filled 2018-12-05: qty 50
  Filled 2018-12-05: qty 25
  Filled 2018-12-05 (×3): qty 30
  Filled 2018-12-05: qty 50
  Filled 2018-12-05: qty 25

## 2018-12-05 MED ORDER — PHENYLEPHRINE 40 MCG/ML (10ML) SYRINGE FOR IV PUSH (FOR BLOOD PRESSURE SUPPORT)
PREFILLED_SYRINGE | INTRAVENOUS | Status: DC | PRN
  Administered 2018-12-05: 160 ug via INTRAVENOUS
  Administered 2018-12-05: 80 ug via INTRAVENOUS
  Administered 2018-12-05 (×2): 120 ug via INTRAVENOUS

## 2018-12-05 MED ORDER — FENTANYL CITRATE (PF) 250 MCG/5ML IJ SOLN
INTRAMUSCULAR | Status: AC
  Filled 2018-12-05: qty 5

## 2018-12-05 MED ORDER — LIDOCAINE 2% (20 MG/ML) 5 ML SYRINGE
INTRAMUSCULAR | Status: DC | PRN
  Administered 2018-12-05: 80 mg via INTRAVENOUS

## 2018-12-05 MED ORDER — DIPHENHYDRAMINE HCL 12.5 MG/5ML PO ELIX
12.5000 mg | ORAL_SOLUTION | Freq: Four times a day (QID) | ORAL | Status: DC | PRN
  Filled 2018-12-05: qty 5

## 2018-12-05 MED ORDER — ACETAMINOPHEN 10 MG/ML IV SOLN
INTRAVENOUS | Status: AC
  Filled 2018-12-05: qty 100

## 2018-12-05 MED ORDER — BISACODYL 5 MG PO TBEC
10.0000 mg | DELAYED_RELEASE_TABLET | Freq: Every day | ORAL | Status: DC
  Administered 2018-12-06 – 2018-12-14 (×6): 10 mg via ORAL
  Filled 2018-12-05 (×7): qty 2

## 2018-12-05 MED ORDER — MIDAZOLAM HCL 2 MG/2ML IJ SOLN
INTRAMUSCULAR | Status: AC
  Filled 2018-12-05: qty 2

## 2018-12-05 MED ORDER — NALOXONE HCL 0.4 MG/ML IJ SOLN: 0.4000 mg | INTRAMUSCULAR | Status: DC | PRN

## 2018-12-05 MED ORDER — ONDANSETRON HCL 4 MG/2ML IJ SOLN
INTRAMUSCULAR | Status: AC
  Filled 2018-12-05: qty 2

## 2018-12-05 MED ORDER — ALBUMIN HUMAN 5 % IV SOLN
INTRAVENOUS | Status: DC | PRN
  Administered 2018-12-05: 16:00:00 via INTRAVENOUS

## 2018-12-05 MED ORDER — ONDANSETRON HCL 4 MG/2ML IJ SOLN: 4.0000 mg | Freq: Four times a day (QID) | INTRAMUSCULAR | Status: DC | PRN

## 2018-12-05 MED ORDER — COLLAGENASE 250 UNIT/GM EX OINT
TOPICAL_OINTMENT | Freq: Every day | CUTANEOUS | Status: DC
  Filled 2018-12-05: qty 30

## 2018-12-05 MED ORDER — ROCURONIUM BROMIDE 10 MG/ML (PF) SYRINGE
PREFILLED_SYRINGE | INTRAVENOUS | Status: DC | PRN
  Administered 2018-12-05: 40 mg via INTRAVENOUS
  Administered 2018-12-05: 30 mg via INTRAVENOUS
  Administered 2018-12-05: 70 mg via INTRAVENOUS

## 2018-12-05 MED ORDER — DEXAMETHASONE SODIUM PHOSPHATE 10 MG/ML IJ SOLN
INTRAMUSCULAR | Status: AC
  Filled 2018-12-05: qty 1

## 2018-12-05 MED ORDER — INSULIN ASPART 100 UNIT/ML ~~LOC~~ SOLN: 0.0000 [IU] | Freq: Three times a day (TID) | SUBCUTANEOUS | Status: DC

## 2018-12-05 MED ORDER — DIPHENHYDRAMINE HCL 50 MG/ML IJ SOLN: 12.5000 mg | Freq: Four times a day (QID) | INTRAMUSCULAR | Status: DC | PRN

## 2018-12-05 MED ORDER — KETAMINE HCL 50 MG/5ML IJ SOSY
PREFILLED_SYRINGE | INTRAMUSCULAR | Status: AC
  Filled 2018-12-05: qty 5

## 2018-12-05 MED ORDER — DIAZEPAM 5 MG PO TABS: 5.0000 mg | ORAL_TABLET | Freq: Every day | ORAL | Status: DC

## 2018-12-05 MED ORDER — PHENYLEPHRINE HCL-NACL 10-0.9 MG/250ML-% IV SOLN
INTRAVENOUS | Status: AC
  Filled 2018-12-05: qty 250

## 2018-12-05 MED ORDER — PROPOFOL 10 MG/ML IV BOLUS
INTRAVENOUS | Status: DC | PRN
  Administered 2018-12-05: 150 mg via INTRAVENOUS

## 2018-12-05 MED ORDER — SODIUM CHLORIDE 0.9 % IV SOLN
INTRAVENOUS | Status: DC | PRN
  Administered 2018-12-05: 20 ug/min via INTRAVENOUS

## 2018-12-05 MED ORDER — PROPOFOL 10 MG/ML IV BOLUS
INTRAVENOUS | Status: AC
  Filled 2018-12-05: qty 20

## 2018-12-05 MED ORDER — LIDOCAINE 2% (20 MG/ML) 5 ML SYRINGE
INTRAMUSCULAR | Status: AC
  Filled 2018-12-05: qty 5

## 2018-12-05 MED ORDER — SENNOSIDES-DOCUSATE SODIUM 8.6-50 MG PO TABS
1.0000 | ORAL_TABLET | Freq: Every day | ORAL | Status: DC
  Administered 2018-12-06 – 2018-12-13 (×5): 1 via ORAL
  Filled 2018-12-05 (×7): qty 1

## 2018-12-05 MED ORDER — ONDANSETRON HCL 4 MG/2ML IJ SOLN
INTRAMUSCULAR | Status: DC | PRN
  Administered 2018-12-05: 4 mg via INTRAVENOUS

## 2018-12-05 MED ORDER — COSYNTROPIN 0.25 MG IJ SOLR
0.2500 mg | Freq: Once | INTRAMUSCULAR | Status: AC
  Administered 2018-12-06: 0.25 mg via INTRAVENOUS
  Filled 2018-12-05 (×2): qty 0.25

## 2018-12-05 MED ORDER — FENTANYL CITRATE (PF) 250 MCG/5ML IJ SOLN
INTRAMUSCULAR | Status: DC | PRN
  Administered 2018-12-05: 50 ug via INTRAVENOUS

## 2018-12-05 MED ORDER — DOXEPIN HCL 100 MG PO CAPS
100.0000 mg | ORAL_CAPSULE | Freq: Every day | ORAL | Status: DC
  Administered 2018-12-05 – 2018-12-13 (×9): 100 mg via ORAL
  Filled 2018-12-05 (×11): qty 1

## 2018-12-05 MED ORDER — KETAMINE HCL 10 MG/ML IJ SOLN
INTRAMUSCULAR | Status: DC | PRN
  Administered 2018-12-05: 10 mg via INTRAVENOUS
  Administered 2018-12-05: 20 mg via INTRAVENOUS

## 2018-12-05 MED ORDER — SUGAMMADEX SODIUM 200 MG/2ML IV SOLN
INTRAVENOUS | Status: DC | PRN
  Administered 2018-12-05: 200 mg via INTRAVENOUS

## 2018-12-05 SURGICAL SUPPLY — 74 items
ADH SKN CLS APL DERMABOND .7 (GAUZE/BANDAGES/DRESSINGS)
APL SRG 22X2 LUM MLBL SLNT (VASCULAR PRODUCTS)
APL SRG 7X2 LUM MLBL SLNT (VASCULAR PRODUCTS)
APPLICATOR TIP COSEAL (VASCULAR PRODUCTS) IMPLANT
APPLICATOR TIP EXT COSEAL (VASCULAR PRODUCTS) IMPLANT
CANISTER SUCT 3000ML PPV (MISCELLANEOUS) ×4 IMPLANT
CATH KIT ON Q 5IN SLV (PAIN MANAGEMENT) IMPLANT
CATH THORACIC 28FR (CATHETERS) IMPLANT
CATH THORACIC 36FR (CATHETERS) IMPLANT
CATH THORACIC 36FR RT ANG (CATHETERS) IMPLANT
CLEANER TIP ELECTROSURG 2X2 (MISCELLANEOUS) ×4 IMPLANT
CLIP VESOCCLUDE MED 6/CT (CLIP) IMPLANT
CONT SPEC 4OZ CLIKSEAL STRL BL (MISCELLANEOUS) ×8 IMPLANT
COVER WAND RF STERILE (DRAPES) ×4 IMPLANT
DERMABOND ADVANCED (GAUZE/BANDAGES/DRESSINGS)
DERMABOND ADVANCED .7 DNX12 (GAUZE/BANDAGES/DRESSINGS) IMPLANT
DRAIN CHANNEL 32F RND 10.7 FF (WOUND CARE) ×2 IMPLANT
DRAPE LAPAROSCOPIC ABDOMINAL (DRAPES) ×4 IMPLANT
DRAPE WARM FLUID 44X44 (DRAPE) ×4 IMPLANT
ELECT CAUTERY BLADE 6.4 (BLADE) ×2 IMPLANT
ELECT REM PT RETURN 9FT ADLT (ELECTROSURGICAL) ×4
ELECTRODE REM PT RTRN 9FT ADLT (ELECTROSURGICAL) ×2 IMPLANT
GAUZE SPONGE 4X4 12PLY STRL (GAUZE/BANDAGES/DRESSINGS) ×4 IMPLANT
GAUZE SPONGE 4X4 12PLY STRL LF (GAUZE/BANDAGES/DRESSINGS) ×4 IMPLANT
GLOVE BIO SURGEON STRL SZ 6.5 (GLOVE) ×6 IMPLANT
GLOVE BIO SURGEONS STRL SZ 6.5 (GLOVE) ×2
GLOVE EUDERMIC 7 POWDERFREE (GLOVE) ×8 IMPLANT
GOWN STRL REUS W/ TWL LRG LVL3 (GOWN DISPOSABLE) ×8 IMPLANT
GOWN STRL REUS W/ TWL XL LVL3 (GOWN DISPOSABLE) ×2 IMPLANT
GOWN STRL REUS W/TWL LRG LVL3 (GOWN DISPOSABLE) ×16
GOWN STRL REUS W/TWL XL LVL3 (GOWN DISPOSABLE) ×4
HANDLE STAPLE ENDO GIA SHORT (STAPLE)
KIT BASIN OR (CUSTOM PROCEDURE TRAY) ×4 IMPLANT
KIT SUCTION CATH 14FR (SUCTIONS) ×4 IMPLANT
KIT TURNOVER KIT B (KITS) ×4 IMPLANT
NS IRRIG 1000ML POUR BTL (IV SOLUTION) ×12 IMPLANT
PACK CHEST (CUSTOM PROCEDURE TRAY) ×4 IMPLANT
PAD ABD 8X10 STRL (GAUZE/BANDAGES/DRESSINGS) ×2 IMPLANT
PAD ARMBOARD 7.5X6 YLW CONV (MISCELLANEOUS) ×8 IMPLANT
SEALANT SURG COSEAL 4ML (VASCULAR PRODUCTS) IMPLANT
SEALANT SURG COSEAL 8ML (VASCULAR PRODUCTS) IMPLANT
SOLUTION ANTI FOG 6CC (MISCELLANEOUS) ×2 IMPLANT
STAPLER ENDO GIA 12 SHRT THIN (STAPLE) IMPLANT
STAPLER ENDO GIA 12MM SHORT (STAPLE) IMPLANT
SUT ETHILON 3 0 PS 1 (SUTURE) ×2 IMPLANT
SUT PROLENE 3 0 SH DA (SUTURE) IMPLANT
SUT PROLENE 4 0 RB 1 (SUTURE)
SUT PROLENE 4-0 RB1 .5 CRCL 36 (SUTURE) IMPLANT
SUT SILK  1 MH (SUTURE) ×4
SUT SILK 1 MH (SUTURE) ×4 IMPLANT
SUT SILK 1 TIES 10X30 (SUTURE) IMPLANT
SUT SILK 2 0SH CR/8 30 (SUTURE) ×2 IMPLANT
SUT SILK 3 0SH CR/8 30 (SUTURE) IMPLANT
SUT VIC AB 1 CTX 18 (SUTURE) IMPLANT
SUT VIC AB 1 CTX 36 (SUTURE)
SUT VIC AB 1 CTX36XBRD ANBCTR (SUTURE) IMPLANT
SUT VIC AB 2-0 CTX 36 (SUTURE) IMPLANT
SUT VIC AB 2-0 UR6 27 (SUTURE) IMPLANT
SUT VIC AB 3-0 MH 27 (SUTURE) IMPLANT
SUT VIC AB 3-0 X1 27 (SUTURE) ×4 IMPLANT
SUT VICRYL 2 TP 1 (SUTURE) IMPLANT
SWAB COLLECTION DEVICE MRSA (MISCELLANEOUS) IMPLANT
SWAB CULTURE ESWAB REG 1ML (MISCELLANEOUS) IMPLANT
SYSTEM SAHARA CHEST DRAIN ATS (WOUND CARE) ×4 IMPLANT
TAPE CLOTH 4X10 WHT NS (GAUZE/BANDAGES/DRESSINGS) ×4 IMPLANT
TAPE CLOTH SURG 6X10 WHT LF (GAUZE/BANDAGES/DRESSINGS) ×2 IMPLANT
TIP APPLICATOR SPRAY EXTEND 16 (VASCULAR PRODUCTS) IMPLANT
TOWEL GREEN STERILE (TOWEL DISPOSABLE) ×6 IMPLANT
TOWEL GREEN STERILE FF (TOWEL DISPOSABLE) ×4 IMPLANT
TRAP SPECIMEN MUCOUS 40CC (MISCELLANEOUS) ×2 IMPLANT
TRAY FOLEY MTR SLVR 14FR STAT (SET/KITS/TRAYS/PACK) ×4 IMPLANT
TUBE CONNECTING 12'X1/4 (SUCTIONS) ×1
TUBE CONNECTING 12X1/4 (SUCTIONS) ×1 IMPLANT
WATER STERILE IRR 1000ML POUR (IV SOLUTION) ×6 IMPLANT

## 2018-12-05 NOTE — Transfer of Care (Signed)
Immediate Anesthesia Transfer of Care Note  Patient: Randy Page  Procedure(s) Performed: RIGHT VIDEO ASSISTED THORACOSCOPY DRAIN EMPYEMA/LUNG ABSCESS (Right Chest) Excisional Debridement Right Sacral Wound (Right Buttocks)  Patient Location: PACU  Anesthesia Type:General  Level of Consciousness: awake  Airway & Oxygen Therapy: Patient Spontanous Breathing  Post-op Assessment: Report given to RN and Post -op Vital signs reviewed and stable  Post vital signs: Reviewed and stable  Last Vitals:  Vitals Value Taken Time  BP 105/54 12/05/2018  4:35 PM  Temp    Pulse 81 12/05/2018  4:44 PM  Resp 19 12/05/2018  4:44 PM  SpO2 96 % 12/05/2018  4:44 PM  Vitals shown include unvalidated device data.  Last Pain:  Vitals:   12/05/18 1200  TempSrc: Oral  PainSc:       Patients Stated Pain Goal: 5 (12/03/18 0300)  Complications: No apparent anesthesia complications

## 2018-12-05 NOTE — Brief Op Note (Signed)
12/02/2018 - 12/05/2018  4:01 PM  PATIENT:  Randy Page  63 y.o. male  PRE-OPERATIVE DIAGNOSIS:  EMPYEMA  POST-OPERATIVE DIAGNOSIS:  EMPYEMA  PROCEDURE:  Procedure(s): RIGHT VIDEO ASSISTED THORACOSCOPY DRAIN EMPYEMA/LUNG ABSCESS (Right)   SURGEON:  Surgeon(s) and Role:  Bartle, Payton Doughty, MD - Primary   PHYSICIAN ASSISTANT: Doree Fudge, PA-C   ANESTHESIA:   general  EBL:  minimal   BLOOD ADMINISTERED:none  DRAINS: (one 71 F Blake drain in the right pleural space.)   LOCAL MEDICATIONS USED:  NONE  SPECIMEN:  Source of Specimen:  pus from empyema cavity  DISPOSITION OF SPECIMEN:  micro  COUNTS:  YES  TOURNIQUET:  * No tourniquets in log *  DICTATION: .Note written in EPIC  PLAN OF CARE: Admit to inpatient   PATIENT DISPOSITION:  PACU - hemodynamically stable.   Delay start of Pharmacological VTE agent (>24hrs) due to surgical blood loss or risk of bleeding: yes

## 2018-12-05 NOTE — Consult Note (Signed)
WOC Nurse wound consult note Patient receiving care in [facility] 4E08.  Reason for Consult: "Decubitus ulcer" Wound type: Patient has 3 unstageable PIs.  One on the right buttock that measures 9.6 cm x 5 cm, is 100% yellow and brown slough. One at the coccyx that measures 1.2 cm x 2 cm and is 100% yellow slough. One on the left buttock that measures 1 cm x 1 cm and is 100% yellow slough. The patient states he fell 6 weeks ago onto his toilet seat and that is what started the wounds on his buttocks. Pressure Injury POA: Yes Drainage (amount, consistency, odor) Heavy brown drainage is on the foam dressing in the area that corresponds to the right buttock wound.  This wound is very painful to him. Periwound: discolored a purple hue.  Dressing procedure/placement/frequency: I have order hydrotherapy by PT, followed by Apply Santyl to right buttock and slough areas at coccyx and left buttock in a nickel thick layer. Cover with a saline moistened gauze, then dry gauze or ABD pad.  Change daily.  He already has a low air loss mattress ordered.  He is due for some type of thoracic surgery today at 1pm. Monitor the wound area(s) for worsening of condition such as: Signs/symptoms of infection,  Increase in size,  Development of or worsening of odor, Development of pain, or increased pain at the affected locations.  Notify the medical team if any of these develop.  Thank you for the consult.  Discussed plan of care with the patient and bedside nurse.  WOC nurse will not follow at this time.  Please re-consult the WOC team if needed.  Helmut Muster, RN, MSN, CWOCN, CNS-BC, pager [phone]

## 2018-12-05 NOTE — Anesthesia Postprocedure Evaluation (Signed)
Anesthesia Post Note  Patient: Randy Page  Procedure(s) Performed: RIGHT VIDEO ASSISTED THORACOSCOPY DRAIN EMPYEMA/LUNG ABSCESS (Right Chest) Excisional Debridement Right Sacral Wound (Right Buttocks)     Patient location during evaluation: PACU Anesthesia Type: General Level of consciousness: awake Pain management: pain level controlled Vital Signs Assessment: post-procedure vital signs reviewed and stable Respiratory status: spontaneous breathing Cardiovascular status: stable Postop Assessment: no apparent nausea or vomiting Anesthetic complications: no    Last Vitals:  Vitals:   12/05/18 1721 12/05/18 1736  BP:  118/65  Pulse:  75  Resp:  15  Temp: (!) 36.2 C 36.6 C  SpO2:  99%    Last Pain:  Vitals:   12/05/18 1843  TempSrc:   PainSc: 10-Worst pain ever                 Abubakar Crispo

## 2018-12-05 NOTE — Interval H&P Note (Signed)
History and Physical Interval Note:  12/05/2018 1:42 PM  Randy Page  has presented today for surgery, with the diagnosis of EMPYEMA.  The various methods of treatment have been discussed with the patient and family. After consideration of risks, benefits and other options for treatment, the patient has consented to  Procedure(s):  (Right) Debridement Wound; Sacral as a surgical intervention.  The patient's history has been reviewed, patient examined, no change in status, stable for surgery.  I have reviewed the patient's chart and labs.  Questions were answered to the patient's satisfaction.     [name]

## 2018-12-05 NOTE — Anesthesia Procedure Notes (Signed)
Procedure Name: Intubation Performed by: Milford Cage, CRNA Pre-anesthesia Checklist: Patient identified, Emergency Drugs available, Suction available and Patient being monitored Patient Re-evaluated:Patient Re-evaluated prior to induction Oxygen Delivery Method: Circle System Utilized Preoxygenation: Pre-oxygenation with 100% oxygen Induction Type: IV induction Ventilation: Mask ventilation without difficulty and Oral airway inserted - appropriate to patient size Laryngoscope Size: Mac and 3 Grade View: Grade I Tube type: Oral Endobronchial tube: Double lumen EBT and Left and 39 Fr Number of attempts: 1 Airway Equipment and Method: Stylet and Oral airway Placement Confirmation: ETT inserted through vocal cords under direct vision,  positive ETCO2 and breath sounds checked- equal and bilateral Secured at: 29 cm Tube secured with: Tape Dental Injury: Teeth and Oropharynx as per pre-operative assessment

## 2018-12-05 NOTE — Progress Notes (Addendum)
PROGRESS NOTE    Randy Page   ZOX:[id number]  DOB: 10-Mar-1956  DOA: 12/02/2018 PCP: Karle PlumberArvind, Moogali M, MD   Brief Narrative:  Randy Page 63 y.o. male with medical history significant for hypertension, hyperlipidemia, recent GI bleed due to duodenal ulcer, anemia, chronic neck pain status post cervical fusion who is transferred [facility] for further management of right-sided empyema and MSSA bacteremia. He presented to [name] after a fall at home. He was found to be hypoglycemia and hypotensive (SBP 80s) by EMS.  Lactic acid was 12.3 CT chest > large air and fluid collection in right hemithorax favored to represent a large empyema, complete collapse of RML and near compete collapse of RLL. Tree in bud opacities in RUL and ELL. Emphysematous changes.   He was [facility] from 10/31/18- 11/03/18 for GI bleed found to be due to a duodenal ulcer.    Subjective: He has been unable to sleep. Still has pain and CT side and in buttocks due to pressure ulcer.     Assessment & Plan:   Principal Problem:   MSSA bacteremia,HCAP,  empyema, acute respiratory failure - underwent chest tube on 5/27 - plan at [name] for 3 wks of Cefazolin and Rifampin - I have asked for an ID consult as well  -   repeat blood cultures negative  - repeat pleural fluid cultures pending - CXR 5/30> no significant change in residual pleural and parenchymal disease at the right base - transferred to [facility] surgery to evaluate - will gave VATS today - wean O2 as able  Active Problems:   Hypotension with h/o Essential hypertension - holding antihypertensives - 5/31>  PT note from yesterday mentions orthostatic hypotension BP drop to 90/65 from 132/61 and he became lightheaded after 45 sec.  - despite fluid replacement, orthostatics continue to be + - TEDS ordered by he declined them- I have explained the rational for orthostatic hypotension  and he is in agreement - Cortisol level 9.7 is a little low for his level of illness- check cosyntropin test tomorrow    Hyperlipidemia - cont Atorvastatin  Hypokalemia -replaced      Chronic pain in neck and back - s/p cervical fusion - cont Oxy IR 15 mg Q4 hrs PRN and Gabapentin    Duodenal ulcer with GI bleed s/p clipping 4/20 - cont Protonix  Anemia- normocytic -  Hb 7.4 -8.5 range -  no prior Hb to compare with -  anemia panel consistent with AOCD - repeat Hb today    Decubitus ulcer- present on admission - followed by wound care clinic as outpt - consult general surgery today - see picture below - added air mattress  Hypoalbuminemia - in context of current illness - have added Ensure and requested an nutrition eval  Elevated INR -  cont vit K x 3 days  - INR is noted to be improving  Nicotine abuse - counseled to quit- he states he would like to but his girlfriend also smokes - cont Nicotine patch  Hyperthyroid -cont Tapazol   Deconditioning/ fall on admission - PT eval ordered- recommending HHPT  Time spent in minutes: 35 min DVT prophylaxis: SCDs Code Status: Full code Family Communication:  Disposition Plan: HHPT recommended Consultants:   CT surgery Procedures:   Chest tube Antimicrobials:  Anti-infectives (From admission, onward)   Start     Dose/Rate Route Frequency Ordered Stop   12/02/18 2330  ceFAZolin (  ANCEF) IVPB 2g/100 mL premix     2 g 200 mL/hr over 30 Minutes Intravenous Every 8 hours 12/02/18 2322     12/02/18 2330  rifampin (RIFADIN) capsule 300 mg     300 mg Oral Every 12 hours 12/02/18 2322         Objective: Vitals:   12/04/18 2103 12/05/18 0000 12/05/18 0325 12/05/18 0639  BP:  (!) 98/57 (!) 105/58   Pulse: 87 82 83 84  Resp: 20 15 19  (!) 22  Temp: 99.2 F (37.3 C) 98.1 F (36.7 C) 98.8 F (37.1 C)   TempSrc: Oral Oral Oral   SpO2: 100% 100% 99% 100%  Weight:    55.1 kg  Height:        Intake/Output  Summary (Last 24 hours) at 12/05/2018 0911 Last data filed at 12/05/2018 0850 Gross per 24 hour  Intake 1526.73 ml  Output 4745 ml  Net -3218.27 ml   Filed Weights   12/03/18 0300 12/04/18 0354 12/05/18 0639  Weight: 58.7 kg 57.2 kg 55.1 kg    Examination: General exam: Appears comfortable  HEENT: PERRLA, oral mucosa moist, no sclera icterus or thrush Respiratory system: RR crackles, chest tube present. Respiratory effort normal. Cardiovascular system: S1 & S2 heard,  No murmurs  Gastrointestinal system: Abdomen soft, non-tender, nondistended. Normal bowel sounds   Central nervous system: Alert and oriented. No focal neurological deficits. Extremities: No cyanosis, clubbing or edema Skin: No rashes or ulcers Psychiatry:  Mood & affect appropriate.      Psychiatry:  Mood & affect appropriate.   Data Reviewed: I have personally reviewed following labs and imaging studies  CBC: Recent Labs  Lab 12/02/18 2341 12/03/18 0217 12/04/18 1054 12/05/18 0435  WBC 4.2 4.8 4.1 5.8  NEUTROABS 2.6  --   --   --   HGB 7.7* 7.4* 8.5* 8.5*  HCT 25.6* 24.6* 28.3* 28.0*  MCV 95.9 96.1 96.3 95.2  PLT 177 181 239 248   Basic Metabolic Panel: Recent Labs  Lab 12/02/18 2341 12/03/18 0217 12/05/18 0435  NA 134* 136 131*  K 3.4* 3.4* 4.3  CL 103 102 99  CO2 28 25 25   GLUCOSE 103* 93 81  BUN 9 8 6*  CREATININE 0.60* 0.60* 0.57*  CALCIUM 7.2* 7.4* 8.3*   GFR: Estimated Creatinine Clearance: 73.7 mL/min (A) (by C-G formula based on SCr of 0.57 mg/dL (L)). Liver Function Tests: Recent Labs  Lab 12/02/18 2341 12/03/18 0217  AST 16 13*  ALT 7 8  ALKPHOS 99 95  BILITOT 0.7 0.7  PROT 5.5* 5.3*  ALBUMIN 1.3* 1.3*   No results for input(s): LIPASE, AMYLASE in the last 168 hours. No results for input(s): AMMONIA in the last 168 hours. Coagulation Profile: Recent Labs  Lab 12/03/18 0217 12/04/18 0342 12/05/18 0435  INR 1.5* 1.4* 1.2   Cardiac Enzymes: No results for input(s):  CKTOTAL, CKMB, CKMBINDEX, TROPONINI in the last 168 hours. BNP (last 3 results) No results for input(s): PROBNP in the last 8760 hours. HbA1C: No results for input(s): HGBA1C in the last 72 hours. CBG: No results for input(s): GLUCAP in the last 168 hours. Lipid Profile: No results for input(s): CHOL, HDL, LDLCALC, TRIG, CHOLHDL, LDLDIRECT in the last 72 hours. Thyroid Function Tests: No results for input(s): TSH, T4TOTAL, FREET4, T3FREE, THYROIDAB in the last 72 hours. Anemia Panel: Recent Labs    12/03/18 1050 12/04/18 0342  VITAMINB12  --  919*  FOLATE  --  7.9  FERRITIN  244  --   TIBC 134*  --   IRON 21*  --   RETICCTPCT 1.9  --    Urine analysis: No results found for: COLORURINE, APPEARANCEUR, LABSPEC, PHURINE, GLUCOSEU, HGBUR, BILIRUBINUR, KETONESUR, PROTEINUR, UROBILINOGEN, NITRITE, LEUKOCYTESUR Sepsis Labs: (procalcitonin:4,lacticidven:4) ) Recent Results (from the past 240 hour(s))  Culture, blood (Routine X 2) w Reflex to ID Panel     Status: None (Preliminary result)   Collection Time: 12/02/18 11:41 PM  Result Value Ref Range Status   Specimen Description BLOOD RIGHT HAND  Final   Special Requests   Final    BOTTLES DRAWN AEROBIC AND ANAEROBIC Blood Culture adequate volume   Culture   Final    NO GROWTH 2 DAYS Performed at [facility], [address], [place], [place] [address]    Report Status PENDING  Incomplete  Culture, blood (Routine X 2) w Reflex to ID Panel     Status: None (Preliminary result)   Collection Time: 12/02/18 11:41 PM  Result Value Ref Range Status   Specimen Description BLOOD RIGHT HAND  Final   Special Requests   Final    BOTTLES DRAWN AEROBIC AND ANAEROBIC Blood Culture results may not be optimal due to an excessive volume of blood received in culture bottles   Culture   Final    NO GROWTH 2 DAYS Performed at [facility], [address] [place], [place] [address]    Report Status PENDING  Incomplete  SARS  Coronavirus 2 (CEPHEID - Performed in [facility] hospital lab), Hosp Order     Status: None   Collection Time: 12/03/18  1:40 AM  Result Value Ref Range Status   SARS Coronavirus 2 NEGATIVE NEGATIVE Final    Comment: (NOTE) If result is NEGATIVE SARS-CoV-2 target nucleic acids are NOT DETECTED. The SARS-CoV-2 RNA is generally detectable in upper and lower  respiratory specimens during the acute phase of infection. The lowest  concentration of SARS-CoV-2 viral copies this assay can detect is 250  copies / mL. A negative result does not preclude SARS-CoV-2 infection  and should not be used as the sole basis for treatment or other  patient management decisions.  A negative result may occur with  improper specimen collection / handling, submission of specimen other  than nasopharyngeal swab, presence of viral mutation(s) within the  areas targeted by this assay, and inadequate number of viral copies  (<250 copies / mL). A negative result must be combined with clinical  observations, patient history, and epidemiological information. If result is POSITIVE SARS-CoV-2 target nucleic acids are DETECTED. The SARS-CoV-2 RNA is generally detectable in upper and lower  respiratory specimens dur ing the acute phase of infection.  Positive  results are indicative of active infection with SARS-CoV-2.  Clinical  correlation with patient history and other diagnostic information is  necessary to determine patient infection status.  Positive results do  not rule out bacterial infection or co-infection with other viruses. If result is PRESUMPTIVE POSTIVE SARS-CoV-2 nucleic acids MAY BE PRESENT.   A presumptive positive result was obtained on the submitted specimen  and confirmed on repeat testing.  While 2019 novel coronavirus  (SARS-CoV-2) nucleic acids may be present in the submitted sample  additional confirmatory testing may be necessary for epidemiological  and / or clinical management purposes  to  differentiate between  SARS-CoV-2 and other Sarbecovirus currently known to infect humans.  If clinically indicated additional testing with an alternate test  methodology [phone])  is advised. The SARS-CoV-2 RNA is generally  detectable in upper and lower respiratory sp ecimens during the acute  phase of infection. The expected result is Negative. Fact Sheet for Patients:  BoilerBrush.com.cy Fact Sheet for Healthcare Providers: https://pope.com/ This test is not yet approved or cleared by the [place] FDA and has been authorized for detection and/or diagnosis of SARS-CoV-2 by FDA under an Emergency Use Authorization (EUA).  This EUA will remain in effect (meaning this test can be used) for the duration of the COVID-19 declaration under Section 564(b)(1) of the Act, 21 U.S.C. section 360bbb-3(b)(1), unless the authorization is terminated or revoked sooner. Performed at [facility], [address] [place], [place] [address]   MRSA PCR Screening     Status: None   Collection Time: 12/04/18  8:33 PM  Result Value Ref Range Status   MRSA by PCR NEGATIVE NEGATIVE Final    Comment:        The GeneXpert MRSA Assay (FDA approved for NASAL specimens only), is one component of a comprehensive MRSA colonization surveillance program. It is not intended to diagnose MRSA infection nor to guide or monitor treatment for MRSA infections. Performed at [facility], [address], [place], [place] [address]          Radiology Studies: No results found.    Scheduled Meds: . sodium chloride   Intravenous Once  . atorvastatin  20 mg Oral QHS  . diazepam  2 mg Oral QHS  . diazepam  2 mg Oral Once  . feeding supplement (ENSURE ENLIVE)  237 mL Oral TID BM  . gabapentin  600 mg Oral QID  . multivitamin with minerals  1 tablet Oral Daily  . nicotine  14 mg Transdermal Daily  . pantoprazole  40 mg Oral Daily  .  phytonadione  5 mg Oral Daily  . rifampin  300 mg Oral Q12H  . sodium chloride flush  3 mL Intravenous Q12H  . tiZANidine  4 mg Oral QHS   Continuous Infusions: . sodium chloride 75 mL/hr at 12/05/18 0328  .  ceFAZolin (ANCEF) IV 2 g (12/05/18 0645)     LOS: 3 days      Calvert Cantor, MD [facility] Pager: [email or web address] Password [email or web address] 12/05/2018, 9:11 AM

## 2018-12-05 NOTE — Op Note (Signed)
PATIENT:  Randy Page  63 y.o. male [id number] Feb 21, 1956   PRE-OPERATIVE DIAGNOSIS:  Right sacral wound  POST-OPERATIVE DIAGNOSIS:  same  PROCEDURE:  Procedure(s): EXCISIONAL DEBRIDEMENT OF RIGHT SACRAL WOUND   SURGEON:  Surgeon(s) and Role:     [name], MD - Primary  PHYSICIAN ASSISTANT: NONE  ASSISTANTS: none   ANESTHESIA:   general  EBL:  MINIMAL  BLOOD ADMINISTERED:none  DRAINS: none   LOCAL MEDICATIONS USED:  NONE  SPECIMEN:  Source of Specimen:  SKIN AND SOFT TISSUE  DISPOSITION OF SPECIMEN:  DISCARDED  COUNTS:  YES  INDICATION FOR PROCEDURE: 63 year old male with a history of hypertension, hyperlipidemia recent GI bleed secondary to duodenal ulcer who is been at outside hospital with a right-sided empyema and bacteremia was transferred to this facility on May 29 for care regarding his empyema.  He was also found to have a pressure ulcer on his buttock.  He states is been there for about 6 weeks.  We were asked to evaluate.  On evaluation he is found to have an area of nonviable skin in the setting of a unstageable right sacral pressure wound.  I recommended excisional debridement.  Please see chart for additional details.  He was going to the operating room with thoracic surgery for evacuation of his empyema.  The plan was to do this procedure at the end of his VATS  DESCRIPTION OF PROCEDURE: After obtaining informed consent the patient was taken to the operating room 10 at [facility].  General endotracheal anesthesia was established.  He was placed in the lateral position.  Sequential compression devices had been placed.  He had the appropriate padding.  Dr. Laneta Simmers did his portion of the procedure first.  Please see his operative note regarding the description about the VATS procedure and drainage of the empyema.  When he was done I opened up the drapes overlying the buttock region.  His buttocks were prepped with Betadine and then  draped in usual standard fashion.  Surgical timeout was performed.  He was on scheduled therapeutic antibiotics.  In the medial portion of the right buttock he had an area of nonviable skin.  An elliptical incision was made sharply with a scalpel.  This nonviable skin was sharply excised.  Some additional skin and soft tissue was taken back to healthy tissue.  There is no exposed bone.  There was no pus.  Hemostasis is achieved electrocautery.  A moist 4 x 4 gauze was packed into the wound and covered with dry gauze and ABD pad followed by placement of mesh underwear.  All needle, instrument sponge counts were correct x2.  There were no immediate complications.  Patient was placed in the supine position extubated and taken to the recovery room in stable condition.    Tool used for debridement (curette, scapel, etc.)  scapel    Frequency of surgical debridement.   initial    Area and depth of devitalized tissue removed from wound.  4 x2.5 x 0.5 cm deep   Blood loss and description of tissue removed.  Minimal bleeding, skin/soft tissue  Evidence of the progress of the wound's response to treatment.   Current wound volume (current dimensions and depth).  4x2.5 x0.5cm    Presence (and extent of) of non viable tissue.  None remaining   Other material in the wound that is expected to inhibit healing. none  8.  Was there any viable tissue removed (measurements): none   PLAN OF CARE:  Admit to inpatient   PATIENT DISPOSITION:  PACU - hemodynamically stable.   Delay start of Pharmacological VTE agent (>24hrs) due to surgical blood loss or risk of bleeding: no

## 2018-12-05 NOTE — Consult Note (Addendum)
[facility] Surgery Consult/Admission Note  Randy EMBERTON 01/03/1956  [id number].    Requesting MD: Dr. Wynelle Cleveland Chief Complaint/Reason for Consult: sacral wound  HPI:  Pt is a 63 yo male with a hx of HTN, HLD, recent GI bleed 2/2 duodenal ulcer, anemia, chronic neck pain s/p cervical fusion who was transferred from [facility] for management of a R sided empyema and MSSA bacteremia. Pt was admitted on 05/29 to [facility]. Pt had a fall at home. He presented to [facility]. Workup showed lactic acid of 12.3 and large air fluid collection in R hemithorax, favored empyema, collapse of RML and near collapse of RLL. Pt has a pressure ulcer on his buttock. He states has been there for about 6 weeks. It is painful to lay on. No associated symptoms. We were asked to see.   ROS:  Review of Systems  Constitutional: Negative for chills, diaphoresis and fever.  HENT: Negative for sore throat.   Respiratory: Negative for cough and shortness of breath.   Cardiovascular: Positive for chest pain (R side).  Gastrointestinal: Negative for abdominal pain, blood in stool, constipation, diarrhea, nausea and vomiting.  Genitourinary: Negative for dysuria.       + buttock pain from ulcer   Skin: Negative for rash.  Neurological: Negative for dizziness and loss of consciousness.  All other systems reviewed and are negative.    No family history on file.  Past Medical History:  Diagnosis Date  . Chronically on opiate therapy   . Hypercholesteremia   . Hyperlipidemia   . Hypertension   . Low back pain   . Thyrotoxicosis   . Vitamin D deficiency     No past surgical history on file.  Social History:  reports that he has been smoking. He has never used smokeless tobacco. No history on file for alcohol and drug.  Allergies: No Known Allergies  Medications Prior to Admission  Medication Sig Dispense Refill  . aspirin EC 81 MG tablet Take 1 tablet (81 mg total) by mouth daily. 90 tablet 3  . atorvastatin  (LIPITOR) 20 MG tablet Take 20 mg by mouth at bedtime.  3  . diazepam (VALIUM) 2 MG tablet Take 2 mg by mouth at bedtime.  0  . doxepin (SINEQUAN) 100 MG capsule Take 100 mg by mouth at bedtime.  3  . gabapentin (NEURONTIN) 600 MG tablet Take 600 mg by mouth 4 (four) times daily.  3  . methimazole (TAPAZOLE) 5 MG tablet Take 5 mg by mouth 2 (two) times daily.  0  . NARCAN 4 MG/0.1ML LIQD nasal spray kit Place 1 spray into the nose once.   2  . oxyCODONE (ROXICODONE) 15 MG immediate release tablet Take 15 mg by mouth 4 (four) times daily.  0  . pantoprazole (PROTONIX) 40 MG tablet Take 40 mg by mouth daily.  3  . PROAIR HFA 108 (90 Base) MCG/ACT inhaler Inhale 2 puffs into the lungs 3 (three) times daily.   3  . tiZANidine (ZANAFLEX) 4 MG capsule Take 4 mg by mouth at bedtime.      Blood pressure (!) 105/58, pulse 84, temperature 98.8 F (37.1 C), temperature source Oral, resp. rate (!) 22, height _0  (1.753 m), weight 55.1 kg, SpO2 100 %.  Physical Exam Vitals signs and nursing note reviewed. Exam conducted with a chaperone present.  Constitutional:      General: He is not in acute distress.    Appearance: He is underweight. He is not ill-appearing, toxic-appearing  or diaphoretic.  HENT:     Head: Normocephalic and atraumatic.     Nose: Nose normal.     Mouth/Throat:     Lips: Pink.     Mouth: Mucous membranes are moist.     Pharynx: Oropharynx is clear.  Eyes:     General: No scleral icterus.       Right eye: No discharge.        Left eye: No discharge.     Conjunctiva/sclera: Conjunctivae normal.     Pupils: Pupils are equal, round, and reactive to light.  Neck:     Musculoskeletal: Normal range of motion and neck supple.  Cardiovascular:     Rate and Rhythm: Normal rate and regular rhythm.     Pulses:          Radial pulses are 2+ on the right side and 2+ on the left side.     Heart sounds: Normal heart sounds. No murmur.  Pulmonary:     Effort: Pulmonary effort is  normal. No respiratory distress.     Breath sounds: Examination of the right-middle field reveals rales. Examination of the right-lower field reveals rales. Rales present. No wheezing or rhonchi.     Comments: Chest tube in R chest with purulent drainage  Abdominal:     General: Bowel sounds are normal. There is no distension.     Palpations: Abdomen is soft. Abdomen is not rigid.     Tenderness: There is no abdominal tenderness. There is no guarding.  Genitourinary:    Comments: Sacral/ R buttock wound see photo below. TTP Musculoskeletal: Normal range of motion.        General: No tenderness or deformity.  Skin:    General: Skin is warm and dry.     Findings: No rash.  Neurological:     Mental Status: He is alert and oriented to person, place, and time.  Psychiatric:        Attention and Perception: Attention normal.        Mood and Affect: Mood normal.        Behavior: Behavior normal. Behavior is cooperative.         Results for orders placed or performed during the hospital encounter of 12/02/18 (from the past 48 hour(s))  Iron and TIBC     Status: Abnormal   Collection Time: 12/03/18 10:50 AM  Result Value Ref Range   Iron 21 (L) 45 - 182 ug/dL   TIBC 134 (L) 250 - 450 ug/dL   Saturation Ratios 16 (L) 17.9 - 39.5 %   UIBC 113 ug/dL    Comment: Performed at [facility] [address] [place], [place] [address]  Ferritin     Status: None   Collection Time: 12/03/18 10:50 AM  Result Value Ref Range   Ferritin 244 24 - 336 ng/mL    Comment: Performed at [facility], [place] [address] [place], [place] [address]  Reticulocytes     Status: Abnormal   Collection Time: 12/03/18 10:50 AM  Result Value Ref Range   Retic Ct Pct 1.9 0.4 - 3.1 %   RBC. 2.99 (L) 4.22 - 5.81 MIL/uL   Retic Count, Absolute 56.8 19.0 - 186.0 K/uL   Immature Retic Fract 22.3 (H) 2.3 - 15.9 %    Comment: Performed at [facility] [address] [place], [place] [address]   Protime-INR     Status: Abnormal   Collection Time: 12/04/18  3:42 AM  Result Value Ref Range   Prothrombin Time 16.6 (H) 11.4 - 15.2 seconds   INR 1.4 (H) 0.8 - 1.2    Comment: (NOTE) INR goal varies based on device and disease states. Performed at [facility], [place] [address] [place], [place] [address]   Vitamin B12     Status: Abnormal   Collection Time: 12/04/18  3:42 AM  Result Value Ref Range   Vitamin B-12 919 (H) 180 - 914 pg/mL    Comment: (NOTE) This assay is not validated for testing neonatal or myeloproliferative syndrome specimens for Vitamin B12 levels. Performed at [facility], [place] [address], [place], [place] [address]   Folate     Status: None   Collection Time: 12/04/18  3:42 AM  Result Value Ref Range   Folate 7.9 >5.9 ng/mL    Comment: Performed at [facility], [place] [address] [place], [place] [address]  CBC     Status: Abnormal   Collection Time: 12/04/18 10:54 AM  Result Value Ref Range   WBC 4.1 4.0 - 10.5 K/uL   RBC 2.94 (L) 4.22 - 5.81 MIL/uL   Hemoglobin 8.5 (L) 13.0 - 17.0 g/dL   HCT 28.3 (L) 39.0 - 52.0 %   MCV 96.3 80.0 - 100.0 fL   MCH 28.9 26.0 - 34.0 pg   MCHC 30.0 30.0 - 36.0 g/dL   RDW 18.5 (H) 11.5 - 15.5 %   Platelets 239 150 - 400 K/uL   nRBC 0.0 0.0 - 0.2 %    Comment: Performed at [facility], [place] [address] [place], [place] [address]  Type and screen [place]     Status: None (Preliminary result)   Collection Time: 12/04/18  4:48 PM  Result Value Ref Range   ABO/RH(D) O POS    Antibody Screen NEG    Sample Expiration 12/07/2018,2359    Unit Number [id number]    Blood Component Type RED CELLS,LR    Unit division 00    Status of Unit ALLOCATED    Transfusion Status OK TO TRANSFUSE    Crossmatch Result      Compatible Performed at [facility], [place] [address] [place], [place] [address]    Unit Number [id number]    Blood Component Type RED CELLS,LR    Unit  division 00    Status of Unit ALLOCATED    Transfusion Status OK TO TRANSFUSE    Crossmatch Result Compatible   ABO/Rh     Status: None   Collection Time: 12/04/18  4:48 PM  Result Value Ref Range   ABO/RH(D)      O POS Performed at [facility] [address], [place], [place] [address]   MRSA PCR Screening     Status: None   Collection Time: 12/04/18  8:33 PM  Result Value Ref Range   MRSA by PCR NEGATIVE NEGATIVE    Comment:        The GeneXpert MRSA Assay (FDA approved for NASAL specimens only), is one component of a comprehensive MRSA colonization surveillance program. It is not intended to diagnose MRSA infection nor to guide or monitor treatment for MRSA infections. Performed at [facility], [place] [address], [place], [place] [address]   Protime-INR     Status: None   Collection Time: 12/05/18  4:35 AM  Result Value Ref Range   Prothrombin Time 15.1 11.4 - 15.2 seconds  INR 1.2 0.8 - 1.2    Comment: (NOTE) INR goal varies based on device and disease states. Performed at [facility], [place] [address] [place], [place] [address]   CBC     Status: Abnormal   Collection Time: 12/05/18  4:35 AM  Result Value Ref Range   WBC 5.8 4.0 - 10.5 K/uL   RBC 2.94 (L) 4.22 - 5.81 MIL/uL   Hemoglobin 8.5 (L) 13.0 - 17.0 g/dL   HCT 28.0 (L) 39.0 - 52.0 %   MCV 95.2 80.0 - 100.0 fL   MCH 28.9 26.0 - 34.0 pg   MCHC 30.4 30.0 - 36.0 g/dL   RDW 18.3 (H) 11.5 - 15.5 %   Platelets 248 150 - 400 K/uL   nRBC 0.0 0.0 - 0.2 %    Comment: Performed at [facility], [place] [address], [place], [place] [address]  Basic metabolic panel     Status: Abnormal   Collection Time: 12/05/18  4:35 AM  Result Value Ref Range   Sodium 131 (L) 135 - 145 mmol/L   Potassium 4.3 3.5 - 5.1 mmol/L   Chloride 99 98 - 111 mmol/L   CO2 25 22 - 32 mmol/L   Glucose, Bld 81 70 - 99 mg/dL   BUN 6 (L) 8 - 23 mg/dL   Creatinine, Ser 0.57 (L) 0.61 - 1.24 mg/dL   Calcium 8.3 (L) 8.9 -  10.3 mg/dL   GFR calc non Af Amer >60 >60 mL/min   GFR calc Af Amer >60 >60 mL/min   Anion gap 7 5 - 15    Comment: Performed at [facility], [place] [address], [place], [place] [address]  Cortisol     Status: None   Collection Time: 12/05/18  4:35 AM  Result Value Ref Range   Cortisol, Plasma 9.7 ug/dL    Comment: (NOTE) AM    6.7 - 22.6 ug/dL PM   <10.0       ug/dL Performed at [facility] [address] [place], [place] [address]    No results found.    Assessment/Plan Principal Problem:   MSSA bacteremia Active Problems:   Essential hypertension   Cigarette smoker   Hyperlipidemia   Empyema of right pleural space (HCC)   Chronic pain   Duodenal ulcer   Decubitus ulcer   Elevated INR  L buttock/sacral decub - needs debridement in order for this to heal, bedside vs OR although I do not think pt would tolerated bedside debridement.  - will see if we can plan with cardiothoracic surgery to debride today during VATS  Thank you for the consult.    Kalman Drape, [facility] Surgery 12/05/2018, 10:13 AM Pager: [phone] Consults: [phone] Mon-Fri 7:00 am-4:30 pm Sat-Sun 7:00 am-11:30 am

## 2018-12-05 NOTE — H&P (View-Only) (Signed)
[facility] Surgery Consult/Admission Note  Randy Page 01/03/1956  [id number].    Requesting MD: Dr. Wynelle Cleveland Chief Complaint/Reason for Consult: sacral wound  HPI:  Pt is a 63 yo male with a hx of HTN, HLD, recent GI bleed 2/2 duodenal ulcer, anemia, chronic neck pain s/p cervical fusion who was transferred from [facility] for management of a R sided empyema and MSSA bacteremia. Pt was admitted on 05/29 to [facility]. Pt had a fall at home. He presented to [facility]. Workup showed lactic acid of 12.3 and large air fluid collection in R hemithorax, favored empyema, collapse of RML and near collapse of RLL. Pt has a pressure ulcer on his buttock. He states has been there for about 6 weeks. It is painful to lay on. No associated symptoms. We were asked to see.   ROS:  Review of Systems  Constitutional: Negative for chills, diaphoresis and fever.  HENT: Negative for sore throat.   Respiratory: Negative for cough and shortness of breath.   Cardiovascular: Positive for chest pain (R side).  Gastrointestinal: Negative for abdominal pain, blood in stool, constipation, diarrhea, nausea and vomiting.  Genitourinary: Negative for dysuria.       + buttock pain from ulcer   Skin: Negative for rash.  Neurological: Negative for dizziness and loss of consciousness.  All other systems reviewed and are negative.    No family history on file.  Past Medical History:  Diagnosis Date  . Chronically on opiate therapy   . Hypercholesteremia   . Hyperlipidemia   . Hypertension   . Low back pain   . Thyrotoxicosis   . Vitamin D deficiency     No past surgical history on file.  Social History:  reports that he has been smoking. He has never used smokeless tobacco. No history on file for alcohol and drug.  Allergies: No Known Allergies  Medications Prior to Admission  Medication Sig Dispense Refill  . aspirin EC 81 MG tablet Take 1 tablet (81 mg total) by mouth daily. 90 tablet 3  . atorvastatin  (LIPITOR) 20 MG tablet Take 20 mg by mouth at bedtime.  3  . diazepam (VALIUM) 2 MG tablet Take 2 mg by mouth at bedtime.  0  . doxepin (SINEQUAN) 100 MG capsule Take 100 mg by mouth at bedtime.  3  . gabapentin (NEURONTIN) 600 MG tablet Take 600 mg by mouth 4 (four) times daily.  3  . methimazole (TAPAZOLE) 5 MG tablet Take 5 mg by mouth 2 (two) times daily.  0  . NARCAN 4 MG/0.1ML LIQD nasal spray kit Place 1 spray into the nose once.   2  . oxyCODONE (ROXICODONE) 15 MG immediate release tablet Take 15 mg by mouth 4 (four) times daily.  0  . pantoprazole (PROTONIX) 40 MG tablet Take 40 mg by mouth daily.  3  . PROAIR HFA 108 (90 Base) MCG/ACT inhaler Inhale 2 puffs into the lungs 3 (three) times daily.   3  . tiZANidine (ZANAFLEX) 4 MG capsule Take 4 mg by mouth at bedtime.      Blood pressure (!) 105/58, pulse 84, temperature 98.8 F (37.1 C), temperature source Oral, resp. rate (!) 22, height _0  (1.753 m), weight 55.1 kg, SpO2 100 %.  Physical Exam Vitals signs and nursing note reviewed. Exam conducted with a chaperone present.  Constitutional:      General: He is not in acute distress.    Appearance: He is underweight. He is not ill-appearing, toxic-appearing  or diaphoretic.  HENT:     Head: Normocephalic and atraumatic.     Nose: Nose normal.     Mouth/Throat:     Lips: Pink.     Mouth: Mucous membranes are moist.     Pharynx: Oropharynx is clear.  Eyes:     General: No scleral icterus.       Right eye: No discharge.        Left eye: No discharge.     Conjunctiva/sclera: Conjunctivae normal.     Pupils: Pupils are equal, round, and reactive to light.  Neck:     Musculoskeletal: Normal range of motion and neck supple.  Cardiovascular:     Rate and Rhythm: Normal rate and regular rhythm.     Pulses:          Radial pulses are 2+ on the right side and 2+ on the left side.     Heart sounds: Normal heart sounds. No murmur.  Pulmonary:     Effort: Pulmonary effort is  normal. No respiratory distress.     Breath sounds: Examination of the right-middle field reveals rales. Examination of the right-lower field reveals rales. Rales present. No wheezing or rhonchi.     Comments: Chest tube in R chest with purulent drainage  Abdominal:     General: Bowel sounds are normal. There is no distension.     Palpations: Abdomen is soft. Abdomen is not rigid.     Tenderness: There is no abdominal tenderness. There is no guarding.  Genitourinary:    Comments: Sacral/ R buttock wound see photo below. TTP Musculoskeletal: Normal range of motion.        General: No tenderness or deformity.  Skin:    General: Skin is warm and dry.     Findings: No rash.  Neurological:     Mental Status: He is alert and oriented to person, place, and time.  Psychiatric:        Attention and Perception: Attention normal.        Mood and Affect: Mood normal.        Behavior: Behavior normal. Behavior is cooperative.         Results for orders placed or performed during the hospital encounter of 12/02/18 (from the past 48 hour(s))  Iron and TIBC     Status: Abnormal   Collection Time: 12/03/18 10:50 AM  Result Value Ref Range   Iron 21 (L) 45 - 182 ug/dL   TIBC 134 (L) 250 - 450 ug/dL   Saturation Ratios 16 (L) 17.9 - 39.5 %   UIBC 113 ug/dL    Comment: Performed at [facility] [address] [place], [place] [address]  Ferritin     Status: None   Collection Time: 12/03/18 10:50 AM  Result Value Ref Range   Ferritin 244 24 - 336 ng/mL    Comment: Performed at [facility], [place] [address] [place], [place] [address]  Reticulocytes     Status: Abnormal   Collection Time: 12/03/18 10:50 AM  Result Value Ref Range   Retic Ct Pct 1.9 0.4 - 3.1 %   RBC. 2.99 (L) 4.22 - 5.81 MIL/uL   Retic Count, Absolute 56.8 19.0 - 186.0 K/uL   Immature Retic Fract 22.3 (H) 2.3 - 15.9 %    Comment: Performed at [facility] [address] [place], [place] [address]   Protime-INR     Status: Abnormal   Collection Time: 12/04/18  3:42 AM  Result Value Ref Range   Prothrombin Time 16.6 (H) 11.4 - 15.2 seconds   INR 1.4 (H) 0.8 - 1.2    Comment: (NOTE) INR goal varies based on device and disease states. Performed at [facility], [place] [address] [place], [place] [address]   Vitamin B12     Status: Abnormal   Collection Time: 12/04/18  3:42 AM  Result Value Ref Range   Vitamin B-12 919 (H) 180 - 914 pg/mL    Comment: (NOTE) This assay is not validated for testing neonatal or myeloproliferative syndrome specimens for Vitamin B12 levels. Performed at [facility], [place] [address], [place], [place] [address]   Folate     Status: None   Collection Time: 12/04/18  3:42 AM  Result Value Ref Range   Folate 7.9 >5.9 ng/mL    Comment: Performed at [facility], [place] [address] [place], [place] [address]  CBC     Status: Abnormal   Collection Time: 12/04/18 10:54 AM  Result Value Ref Range   WBC 4.1 4.0 - 10.5 K/uL   RBC 2.94 (L) 4.22 - 5.81 MIL/uL   Hemoglobin 8.5 (L) 13.0 - 17.0 g/dL   HCT 28.3 (L) 39.0 - 52.0 %   MCV 96.3 80.0 - 100.0 fL   MCH 28.9 26.0 - 34.0 pg   MCHC 30.0 30.0 - 36.0 g/dL   RDW 18.5 (H) 11.5 - 15.5 %   Platelets 239 150 - 400 K/uL   nRBC 0.0 0.0 - 0.2 %    Comment: Performed at [facility], [place] [address] [place], [place] [address]  Type and screen [place]     Status: None (Preliminary result)   Collection Time: 12/04/18  4:48 PM  Result Value Ref Range   ABO/RH(D) O POS    Antibody Screen NEG    Sample Expiration 12/07/2018,2359    Unit Number [id number]    Blood Component Type RED CELLS,LR    Unit division 00    Status of Unit ALLOCATED    Transfusion Status OK TO TRANSFUSE    Crossmatch Result      Compatible Performed at [facility], [place] [address] [place], [place] [address]    Unit Number [id number]    Blood Component Type RED CELLS,LR    Unit  division 00    Status of Unit ALLOCATED    Transfusion Status OK TO TRANSFUSE    Crossmatch Result Compatible   ABO/Rh     Status: None   Collection Time: 12/04/18  4:48 PM  Result Value Ref Range   ABO/RH(D)      O POS Performed at [facility] [address], [place], [place] [address]   MRSA PCR Screening     Status: None   Collection Time: 12/04/18  8:33 PM  Result Value Ref Range   MRSA by PCR NEGATIVE NEGATIVE    Comment:        The GeneXpert MRSA Assay (FDA approved for NASAL specimens only), is one component of a comprehensive MRSA colonization surveillance program. It is not intended to diagnose MRSA infection nor to guide or monitor treatment for MRSA infections. Performed at [facility], [place] [address], [place], [place] [address]   Protime-INR     Status: None   Collection Time: 12/05/18  4:35 AM  Result Value Ref Range   Prothrombin Time 15.1 11.4 - 15.2 seconds  INR 1.2 0.8 - 1.2    Comment: (NOTE) INR goal varies based on device and disease states. Performed at [facility], [place] [address] [place], [place] [address]   CBC     Status: Abnormal   Collection Time: 12/05/18  4:35 AM  Result Value Ref Range   WBC 5.8 4.0 - 10.5 K/uL   RBC 2.94 (L) 4.22 - 5.81 MIL/uL   Hemoglobin 8.5 (L) 13.0 - 17.0 g/dL   HCT 28.0 (L) 39.0 - 52.0 %   MCV 95.2 80.0 - 100.0 fL   MCH 28.9 26.0 - 34.0 pg   MCHC 30.4 30.0 - 36.0 g/dL   RDW 18.3 (H) 11.5 - 15.5 %   Platelets 248 150 - 400 K/uL   nRBC 0.0 0.0 - 0.2 %    Comment: Performed at [facility], [place] [address], [place], [place] [address]  Basic metabolic panel     Status: Abnormal   Collection Time: 12/05/18  4:35 AM  Result Value Ref Range   Sodium 131 (L) 135 - 145 mmol/L   Potassium 4.3 3.5 - 5.1 mmol/L   Chloride 99 98 - 111 mmol/L   CO2 25 22 - 32 mmol/L   Glucose, Bld 81 70 - 99 mg/dL   BUN 6 (L) 8 - 23 mg/dL   Creatinine, Ser 0.57 (L) 0.61 - 1.24 mg/dL   Calcium 8.3 (L) 8.9 -  10.3 mg/dL   GFR calc non Af Amer >60 >60 mL/min   GFR calc Af Amer >60 >60 mL/min   Anion gap 7 5 - 15    Comment: Performed at [facility], [place] [address], [place], [place] [address]  Cortisol     Status: None   Collection Time: 12/05/18  4:35 AM  Result Value Ref Range   Cortisol, Plasma 9.7 ug/dL    Comment: (NOTE) AM    6.7 - 22.6 ug/dL PM   <10.0       ug/dL Performed at [facility] [address] [place], [place] [address]    No results found.    Assessment/Plan Principal Problem:   MSSA bacteremia Active Problems:   Essential hypertension   Cigarette smoker   Hyperlipidemia   Empyema of right pleural space (HCC)   Chronic pain   Duodenal ulcer   Decubitus ulcer   Elevated INR  L buttock/sacral decub - needs debridement in order for this to heal, bedside vs OR although I do not think pt would tolerated bedside debridement.  - will see if we can plan with cardiothoracic surgery to debride today during VATS  Thank you for the consult.    Kalman Drape, [facility] 12/05/2018, 10:13 AM Pager: [phone] Consults: [phone] Mon-Fri 7:00 am-4:30 pm Sat-Sun 7:00 am-11:30 am

## 2018-12-05 NOTE — Op Note (Signed)
12/05/2018 Randy Page [id number]  Surgeon: Alleen BorneBryan K. Nikia Levels, MD   First Assistant: Doree Fudgeonielle Zimmerman, PA-C  Preoperative Diagnosis: Right empyema   Postoperative Diagnosis: Right empyema   Procedure:  1. Right video-assisted thoracoscopy 2. Drainage of empyema    Anesthesia: General Endotracheal   Clinical History/Surgical Indication:   This 63 year old chronically ill and frail-appearing gentleman on chronic narcotics for neck pain from degenerative arthritis and prior surgery presented with MSSA bacteremia likely related to a large fluid collection in the right hemithorax that could be a lung abscess or empyema.  There was a question of endocarditis but TEE has ruled out vegetation.  The patient is clinically stable and afebrile with a normal white blood cell count but a CT scan of the abdomen shows a persistent large fluid collection in the right chest with the pigtail catheter sitting within the fluid.  This could be a lung abscess or empyema but either way I think it needs to be completely drained to prevent further sepsis.  It may be possible drain this with thoracoscopy and drain placement but could also require a small thoracotomy.  I discussed the CT scan results with him and the operative treatment.  I discussed alternatives, benefits, and risk including but not limited to bleeding, blood transfusion, persistent infection, persistent pleural space problems, injury to the lung, prolonged air leak, and need for further procedures and he understands and agrees to proceed.  Preparation:  The patient was seen in the preoperative holding area and the correct patient, correct operation, correct operative sidewere confirmed with the patient after reviewing the medical record and CT scan. The consent was signed by me. Preoperative antibiotics were given. The right side of the chest was signed by me. The patient was taken back to the operating room and positioned supine on the operating room  table. After being placed under general endotracheal anesthesia by the anesthesia team using a double lumen tube a foley catheter was placed. The patient was turned into the left lateral decubitus position. The chest was prepped with betadine soap and solution. A surgical time-out was taken and the correct patient,operative side, and operative procedure were confirmed with the nursing and anesthesia staff.   Operative Procedure:  A 1 cm incision was made in the posterior axillary line at about the 8th intercostal space and an 8 mm trocar was inserted into the pleural space.  A 30 degree thoracoscope was inserted.  Greenish-white pus was encountered.  A straight suction was inserted through the trocar and 750 cc of thick greenish-white pus was removed.  Sample was sent to microbiology for AFB, fungus, and routine culture.  Then the thoracoscope was reinserted.  Examination showed a large cavity which I think was within the pleural space.  The inferior surface of the right lower lobe was visible and the surface appeared necrotic like there was a lung abscess that spontaneously drained into the pleural space.  Another 1 cm incision was made in the anterior axillary line in the same interspace and the scope was moved to this position.  The empyema and lung abscess were debrided as much as possible.  There was fibrinous debris lining the cavity and this was removed with a combination of suction and irrigation.  The lung was firmly adherent to the chest wall along the lines of the cavity.  Hemostasis was complete.  A 32 F Bard drain was placed through the anterior incision and positioned within the empyema cavity.  The posterior incision was closed  with interrupted Vicryl for the muscle and interrupted nylon sutures for the skin.  All sponge, needle, and instrument counts were reported correct at the end of the case. Dry sterile dressings were placed over the incision and around the chest tube which was connected to  pleurevac suction.  Then the second part of the surgical procedure was performed by general surgery to debride the decubitus ulcer.  At the conclusion of surgery the patient was transported to the post anesthesia care unit in satisfactory condition.

## 2018-12-05 NOTE — Anesthesia Procedure Notes (Signed)
Arterial Line Insertion Start/End6/07/2018 2:05 PM, 12/05/2018 2:20 PM Performed by: Dorris Singh, MD, Ezekiel Ina, CRNA, CRNA  Patient location: Pre-op. Preanesthetic checklist: patient identified, IV checked, site marked, risks and benefits discussed, surgical consent, monitors and equipment checked, pre-op evaluation, timeout performed and anesthesia consent Lidocaine 1% used for infiltration Left, radial was placed Catheter size: 20 G Hand hygiene performed , maximum sterile barriers used  and Seldinger technique used  Attempts: 2 Procedure performed without using ultrasound guided technique. Following insertion, dressing applied. Post procedure assessment: normal and unchanged  Patient tolerated the procedure well with no immediate complications.

## 2018-12-05 NOTE — Consult Note (Signed)
[facility]       Reason for Consult: MSSA bacteremia, empyema    Referring Physician: Dr. Butler Denmark  Principal Problem:   MSSA bacteremia Active Problems:   Essential hypertension   Cigarette smoker   Hyperlipidemia   Empyema of right pleural space (HCC)   Chronic pain   Duodenal ulcer   Decubitus ulcer   Elevated INR   . sodium chloride   Intravenous Once  . atorvastatin  20 mg Oral QHS  . collagenase   Topical Daily  . [START ON 12/06/2018] cosyntropin  0.25 mg Intravenous Once  . diazepam  5 mg Oral QHS  . feeding supplement (ENSURE ENLIVE)  237 mL Oral TID BM  . gabapentin  600 mg Oral QID  . multivitamin with minerals  1 tablet Oral Daily  . nicotine  14 mg Transdermal Daily  . pantoprazole  40 mg Oral Daily  . rifampin  300 mg Oral Q12H  . sodium chloride flush  3 mL Intravenous Q12H  . tiZANidine  4 mg Oral QHS    Recommendations: Continue cefazolin and rifampin picc line ok a day or two after VATS Would treat for 2 weeks post VATS  Assessment: He has MSSA bacteremia with empyema and negative TEE.   Repeat blood cultures here without growth.    Antibiotics: Cefazolin and rifampin  HPI: Randy Page is a 63 y.o. male with recent GI bleed, history of cervical fusion with hardware, chronic pain, who presented to [facility] with weakness and mechanical fall and found to have MSSA bacteremia.  Work up with empyema/abscess and to get VATS today.  Initially on zosyn and changed to cefazolin and rifampin 5/27.  TEE done at [facility] by report and no vegetation. No associated diarrhea, nausea.     Review of Systems:  Constitutional: negative for fevers and chills Gastrointestinal: negative for nausea and diarrhea Integument/breast: negative for rash All other systems reviewed and are negative    Past Medical History:  Diagnosis Date  . Chronically on opiate therapy   . Hypercholesteremia   . Hyperlipidemia   . Hypertension   . Low  back pain   . Thyrotoxicosis   . Vitamin D deficiency     Social History   Tobacco Use  . Smoking status: Current Every Day Smoker  . Smokeless tobacco: Never Used  Substance Use Topics  . Alcohol use: Not on file  . Drug use: Not on file    [facility]: no pulmonary disease  No Known Allergies  Physical Exam: Constitutional: in no apparent distress  Vitals:   12/05/18 0325 12/05/18 0639  BP: (!) 105/58   Pulse: 83 84  Resp: 19 (!) 22  Temp: 98.8 F (37.1 C)   SpO2: 99% 100%   EYES: anicteric ENMT: no thrush Cardiovascular: Cor RRR Respiratory: CTA B; no respiratory distress GI: Bowel sounds are normal, Bowel sounds active Musculoskeletal: no pedal edema noted Skin: negatives: no rash Hematologic: no cervical lad  Lab Results  Component Value Date   WBC 5.8 12/05/2018   HGB 8.5 (L) 12/05/2018   HCT 28.0 (L) 12/05/2018   MCV 95.2 12/05/2018   PLT 248 12/05/2018    Lab Results  Component Value Date   CREATININE 0.57 (L) 12/05/2018   BUN 6 (L) 12/05/2018   NA 131 (L) 12/05/2018   K 4.3 12/05/2018   CL 99 12/05/2018   CO2 25 12/05/2018    Lab Results  Component Value Date  ALT 8 12/03/2018   AST 13 (L) 12/03/2018   ALKPHOS 95 12/03/2018     Microbiology: Recent Results (from the past 240 hour(s))  Culture, blood (Routine X 2) w Reflex to ID Panel     Status: None (Preliminary result)   Collection Time: 12/02/18 11:41 PM  Result Value Ref Range Status   Specimen Description BLOOD RIGHT HAND  Final   Special Requests   Final    BOTTLES DRAWN AEROBIC AND ANAEROBIC Blood Culture adequate volume   Culture   Final    NO GROWTH 2 DAYS Performed at [facility], [address], [place], [place] [address]    Report Status PENDING  Incomplete  Culture, blood (Routine X 2) w Reflex to ID Panel     Status: None (Preliminary result)   Collection Time: 12/02/18 11:41 PM  Result Value Ref Range Status   Specimen Description BLOOD RIGHT HAND  Final   Special  Requests   Final    BOTTLES DRAWN AEROBIC AND ANAEROBIC Blood Culture results may not be optimal due to an excessive volume of blood received in culture bottles   Culture   Final    NO GROWTH 2 DAYS Performed at [facility], [address], [place], [place] [address]    Report Status PENDING  Incomplete  SARS Coronavirus 2 (CEPHEID - Performed in [facility] hospital lab), Hosp Order     Status: None   Collection Time: 12/03/18  1:40 AM  Result Value Ref Range Status   SARS Coronavirus 2 NEGATIVE NEGATIVE Final    Comment: (NOTE) If result is NEGATIVE SARS-CoV-2 target nucleic acids are NOT DETECTED. The SARS-CoV-2 RNA is generally detectable in upper and lower  respiratory specimens during the acute phase of infection. The lowest  concentration of SARS-CoV-2 viral copies this assay can detect is 250  copies / mL. A negative result does not preclude SARS-CoV-2 infection  and should not be used as the sole basis for treatment or other  patient management decisions.  A negative result may occur with  improper specimen collection / handling, submission of specimen other  than nasopharyngeal swab, presence of viral mutation(s) within the  areas targeted by this assay, and inadequate number of viral copies  (<250 copies / mL). A negative result must be combined with clinical  observations, patient history, and epidemiological information. If result is POSITIVE SARS-CoV-2 target nucleic acids are DETECTED. The SARS-CoV-2 RNA is generally detectable in upper and lower  respiratory specimens dur ing the acute phase of infection.  Positive  results are indicative of active infection with SARS-CoV-2.  Clinical  correlation with patient history and other diagnostic information is  necessary to determine patient infection status.  Positive results do  not rule out bacterial infection or co-infection with other viruses. If result is PRESUMPTIVE POSTIVE SARS-CoV-2 nucleic acids MAY BE  PRESENT.   A presumptive positive result was obtained on the submitted specimen  and confirmed on repeat testing.  While 2019 novel coronavirus  (SARS-CoV-2) nucleic acids may be present in the submitted sample  additional confirmatory testing may be necessary for epidemiological  and / or clinical management purposes  to differentiate between  SARS-CoV-2 and other Sarbecovirus currently known to infect humans.  If clinically indicated additional testing with an alternate test  methodology [id number]) is advised. The SARS-CoV-2 RNA is generally  detectable in upper and lower respiratory sp ecimens during the acute  phase of infection. The expected result is  Negative. Fact Sheet for Patients:  BoilerBrush.com.cyhttps://www.fda.gov/media/136312/download Fact Sheet for Healthcare Providers: https://pope.com/https://www.fda.gov/media/136313/download This test is not yet approved or cleared by the [place] FDA and has been authorized for detection and/or diagnosis of SARS-CoV-2 by FDA under an Emergency Use Authorization (EUA).  This EUA will remain in effect (meaning this test can be used) for the duration of the COVID-19 declaration under Section 564(b)(1) of the Act, 21 U.S.C. section 360bbb-3(b)(1), unless the authorization is terminated or revoked sooner. Performed at [facility], [address] [place], [place] [address]   MRSA PCR Screening     Status: None   Collection Time: 12/04/18  8:33 PM  Result Value Ref Range Status   MRSA by PCR NEGATIVE NEGATIVE Final    Comment:        The GeneXpert MRSA Assay (FDA approved for NASAL specimens only), is one component of a comprehensive MRSA colonization surveillance program. It is not intended to diagnose MRSA infection nor to guide or monitor treatment for MRSA infections. Performed at [facility], [address] [place], [place] [address]     Gardiner Barefootobert W Greer Koeppen, MD [facility]  [email or web address] 12/05/2018, 10:46 AM

## 2018-12-05 NOTE — Anesthesia Preprocedure Evaluation (Signed)
Anesthesia Evaluation  Patient identified by MRN, date of birth, ID band Patient awake    Reviewed: Allergy & Precautions, NPO status , reviewed documented beta blocker date and time   Airway Mallampati: II   Neck ROM: Limited    Dental   Pulmonary Current Smoker,     + decreased breath sounds      Cardiovascular hypertension,  Rhythm:Regular Rate:Normal     Neuro/Psych    GI/Hepatic PUD,   Endo/Other  Hyperthyroidism   Renal/GU      Musculoskeletal   Abdominal   Peds  Hematology   Anesthesia Other Findings   Reproductive/Obstetrics                             Anesthesia Physical Anesthesia Plan  ASA: III  Anesthesia Plan: General   Post-op Pain Management:    Induction: Intravenous  PONV Risk Score and Plan: 2 and Ondansetron and Midazolam  Airway Management Planned: Double Lumen EBT  Additional Equipment:   Intra-op Plan:   Post-operative Plan: Possible Post-op intubation/ventilation and Post-operative intubation/ventilation  Informed Consent: I have reviewed the patients History and Physical, chart, labs and discussed the procedure including the risks, benefits and alternatives for the proposed anesthesia with the patient or authorized representative who has indicated his/her understanding and acceptance.     Dental advisory given  Plan Discussed with: CRNA and Anesthesiologist  Anesthesia Plan Comments:         Anesthesia Quick Evaluation

## 2018-12-05 NOTE — Brief Op Note (Signed)
12/02/2018 - 12/05/2018  4:07 PM  PATIENT:  Randy Page  63 y.o. male  PRE-OPERATIVE DIAGNOSIS:  Right sacral wound  POST-OPERATIVE DIAGNOSIS:  same  PROCEDURE:  Procedure(s): EXCISIONAL DEBRIDEMENT OF RIGHT SACRAL WOUND   SURGEON:  Surgeon(s) and Role:     [name], MD - Primary  PHYSICIAN ASSISTANT: NONE  ASSISTANTS: none   ANESTHESIA:   general  EBL:  MINIMAL  BLOOD ADMINISTERED:none  DRAINS: none   LOCAL MEDICATIONS USED:  NONE  SPECIMEN:  Source of Specimen:  SKIN AND SOFT TISSUE  DISPOSITION OF SPECIMEN:  DISCARDED  COUNTS:  YES  TOURNIQUET:  * No tourniquets in log *  DICTATION: .Dragon Dictation  PLAN OF CARE: Admit to inpatient   PATIENT DISPOSITION:  PACU - hemodynamically stable.   Delay start of Pharmacological VTE agent (>24hrs) due to surgical blood loss or risk of bleeding: no   Randy Sella. Andrey Campanile, MD, FACS General, Bariatric, & Minimally Invasive Surgery [facility], [place]

## 2018-12-06 ENCOUNTER — Inpatient Hospital Stay (HOSPITAL_COMMUNITY): Payer: Medicare Other

## 2018-12-06 ENCOUNTER — Inpatient Hospital Stay: Payer: Self-pay

## 2018-12-06 ENCOUNTER — Encounter (HOSPITAL_COMMUNITY): Payer: Self-pay | Admitting: Surgery

## 2018-12-06 DIAGNOSIS — Z978 Presence of other specified devices: Secondary | ICD-10-CM

## 2018-12-06 LAB — ACID FAST SMEAR (AFB, MYCOBACTERIA): Acid Fast Smear: NEGATIVE

## 2018-12-06 LAB — GLUCOSE, CAPILLARY
Glucose-Capillary: 130 mg/dL — ABNORMAL HIGH (ref 70–99)
Glucose-Capillary: 217 mg/dL — ABNORMAL HIGH (ref 70–99)
Glucose-Capillary: 107 mg/dL — ABNORMAL HIGH (ref 70–99)

## 2018-12-06 LAB — BASIC METABOLIC PANEL
Anion gap: 6 (ref 5–15)
BUN: 8 mg/dL (ref 8–23)
CO2: 28 mmol/L (ref 22–32)
Calcium: 8.2 mg/dL — ABNORMAL LOW (ref 8.9–10.3)
Chloride: 102 mmol/L (ref 98–111)
Creatinine, Ser: 0.74 mg/dL (ref 0.61–1.24)
GFR calc Af Amer: >60 mL/min (ref >60)
GFR calc non Af Amer: >60 mL/min (ref >60)
Glucose, Bld: 128 mg/dL — ABNORMAL HIGH (ref 70–99)
Potassium: 5 mmol/L (ref 3.5–5.1)
Sodium: 136 mmol/L (ref 135–145)

## 2018-12-06 LAB — CBC
HCT: 27.6 % — ABNORMAL LOW (ref 39.0–52.0)
Hemoglobin: 8.1 g/dL — ABNORMAL LOW (ref 13.0–17.0)
MCH: 28.5 pg (ref 26.0–34.0)
MCHC: 29.3 g/dL — ABNORMAL LOW (ref 30.0–36.0)
MCV: 97.2 fL (ref 80.0–100.0)
Platelets: 273 10*3/uL (ref 150–400)
RBC: 2.84 MIL/uL — ABNORMAL LOW (ref 4.22–5.81)
RDW: 18.1 % — ABNORMAL HIGH (ref 11.5–15.5)
WBC: 6 10*3/uL (ref 4.0–10.5)
nRBC: 0 % (ref 0.0–0.2)

## 2018-12-06 LAB — T4, FREE: Free T4: 0.93 ng/dL (ref 0.82–1.77)

## 2018-12-06 MED ORDER — SODIUM CHLORIDE 0.9 % IV BOLUS
1000.0000 mL | Freq: Once | INTRAVENOUS | Status: AC
  Administered 2018-12-06: 1000 mL via INTRAVENOUS

## 2018-12-06 MED ORDER — SODIUM CHLORIDE 0.9% FLUSH: 10.0000 mL | INTRAVENOUS | Status: DC | PRN

## 2018-12-06 MED ORDER — SODIUM CHLORIDE 0.9% FLUSH
10.0000 mL | Freq: Two times a day (BID) | INTRAVENOUS | Status: DC
  Administered 2018-12-09 – 2018-12-14 (×7): 10 mL

## 2018-12-06 MED ORDER — METOPROLOL TARTRATE 5 MG/5ML IV SOLN
10.0000 mg | Freq: Once | INTRAVENOUS | Status: AC
  Administered 2018-12-06: 10 mg via INTRAVENOUS
  Filled 2018-12-06: qty 10

## 2018-12-06 NOTE — Progress Notes (Signed)
IV 10 mg metoprolol administered as ordered. Once I started pushing med, HR started to come down. I pushed it very slowly. After administration, HR was in 130's BP dropped to 101/70 ( before pushing med HR was 168). Pt keeps moving around and needs constant reminder to rest and relax. Very Anxious and doesn't stop talking. Will continue to monitor.

## 2018-12-06 NOTE — Progress Notes (Signed)
Peripherally Inserted Central Catheter/Midline Placement  The IV Nurse has discussed with the patient and/or persons authorized to consent for the patient, the purpose of this procedure and the potential benefits and risks involved with this procedure.  The benefits include less needle sticks, lab draws from the catheter, and the patient may be discharged home with the catheter. Risks include, but not limited to, infection, bleeding, blood clot (thrombus formation), and puncture of an artery; nerve damage and irregular heartbeat and possibility to perform a PICC exchange if needed/ordered by physician.  Alternatives to this procedure were also discussed.  Bard Power PICC patient education guide, fact sheet on infection prevention and patient information card has been provided to patient /or left at bedside.    PICC/Midline Placement Documentation  PICC Single Lumen 12/06/18 PICC Right Brachial 42 cm 1 cm (Active)  Indication for Insertion or Continuance of Line Poor Vasculature-patient has had multiple peripheral attempts or PIVs lasting less than 24 hours 12/06/2018  2:00 PM  Exposed Catheter (cm) 1 cm 12/06/2018  2:00 PM  Site Assessment Clean;Dry;Intact 12/06/2018  2:00 PM  Line Status Flushed;Saline locked;Blood return noted 12/06/2018  2:00 PM  Dressing Type Transparent;Securing device 12/06/2018  2:00 PM  Dressing Status Clean;Dry;Intact;Antimicrobial disc in place 12/06/2018  2:00 PM  Line Care Connections checked and tightened 12/06/2018  2:00 PM  Dressing Intervention New dressing 12/06/2018  2:00 PM  Dressing Change Due 12/13/18 12/06/2018  2:00 PM       Tonna Boehringer 12/06/2018, 2:38 PM

## 2018-12-06 NOTE — Progress Notes (Signed)
Physical Therapy Treatment Patient Details Name: Randy Page MRN: [id number] DOB: 10/06/1955 Today's Date: 12/06/2018    History of Present Illness Randy Page 63 y.o. male with medical history significant for hypertension, hyperlipidemia, recent GI bleed due to duodenal ulcer, anemia, chronic neck pain status post cervical fusion who is transferred to [facility] from [facility] for further management of right-sided empyema and MSSA bacteremia. s/p EXCISIONAL DEBRIDEMENT OF RIGHT SACRAL WOUND on 12/05/18    PT Comments    Patient received sitting EOB agreeable to participate with PT. PT session limited due to increased HR (up to 150's) with nursing aware. Patient requiring close min guard to min A for transfers with mild unsteadiness throughout. Cueing for safety with PT providing line and tube management. Will continue to follow and progress as patient tolerates.     Follow Up Recommendations  Home health PT;Supervision for mobility/OOB     Equipment Recommendations  None recommended by PT    Recommendations for Other Services       Precautions / Restrictions Precautions Precautions: (chest tube, sacral wound) Restrictions Weight Bearing Restrictions: No    Mobility  Bed Mobility               General bed mobility comments: sitting EOB upon PT arrival  Transfers Overall transfer level: Needs assistance Equipment used: 1 person hand held assist Transfers: Sit to/from Stand;Stand Pivot Transfers Sit to Stand: Min assist;Min guard Stand pivot transfers: Min assist;Min guard       General transfer comment: close min guard to min A for standing at bedside and pivot to reclliner; unsteadiness with mobility with required assist for line and tube management. Patient declining further mobility  Ambulation/Gait             General Gait Details: deferred   Stairs             Wheelchair Mobility    Modified Rankin (Stroke Patients Only)        Balance Overall balance assessment: Needs assistance Sitting-balance support: No upper extremity supported;Feet supported Sitting balance-Leahy Scale: Good     Standing balance support: Single extremity supported;During functional activity Standing balance-Leahy Scale: Fair                              Cognition Arousal/Alertness: Awake/alert Behavior During Therapy: WFL for tasks assessed/performed Overall Cognitive Status: Within Functional Limits for tasks assessed                                        Exercises      General Comments General comments (skin integrity, edema, etc.): HR ranging 120-150 at rest and with pivot to recliner - deferred further mobility due to this with nursing aware; placed foam pads in chair prior to patient sitting      Pertinent Vitals/Pain Pain Assessment: Faces Faces Pain Scale: Hurts whole lot Pain Location: chest tube site and buttocks Pain Descriptors / Indicators: Aching;Discomfort;Grimacing;Guarding Pain Intervention(s): Limited activity within patient's tolerance;Monitored during session;Repositioned;PCA encouraged    Home Living                      Prior Function            PT Goals (current goals can now be found in the care plan section) Acute Rehab PT Goals Patient  Stated Goal: Get well PT Goal Formulation: With patient Time For Goal Achievement: 12/17/18 Potential to Achieve Goals: Good Progress towards PT goals: Progressing toward goals    Frequency    Min 3X/week      PT Plan Current plan remains appropriate    Co-evaluation              AM-PAC PT "6 Clicks" Mobility   Outcome Measure  Help needed turning from your back to your side while in a flat bed without using bedrails?: A Little Help needed moving from lying on your back to sitting on the side of a flat bed without using bedrails?: A Little Help needed moving to and from a bed to a chair (including a  wheelchair)?: A Little Help needed standing up from a chair using your arms (e.g., wheelchair or bedside chair)?: A Little Help needed to walk in hospital room?: A Little Help needed climbing 3-5 steps with a railing? : A Little 6 Click Score: 18    End of Session Equipment Utilized During Treatment: Oxygen Activity Tolerance: Patient tolerated treatment well;Other (comment)(high HR) Patient left: in chair;with call bell/phone within reach;with nursing/sitter in room Nurse Communication: Mobility status PT Visit Diagnosis: Unsteadiness on feet (R26.81);Muscle weakness (generalized) (M62.81);History of falling (Z91.81);Dizziness and giddiness (R42);Difficulty in walking, not elsewhere classified (R26.2)     Time: 1010-1041 PT Time Calculation (min) (ACUTE ONLY): 31 min  Charges:  $Therapeutic Activity: 23-37 mins                      Kipp LaurenceStephanie R Audrielle Vankuren, PT, DPT Supplemental Physical Therapist 12/06/18 11:45 AM Pager: [phone] Office: [phone]

## 2018-12-06 NOTE — Progress Notes (Addendum)
      [address]       [place] [address]             [phone]       1 Day Post-Op Procedure(s) (LRB): RIGHT VIDEO ASSISTED THORACOSCOPY DRAIN EMPYEMA/LUNG ABSCESS (Right) Excisional Debridement Right Sacral Wound (Right)  Subjective: Patient is "tired of hearing beeping noises";Nurse states it is PCA  Objective: Vital signs in last 24 hours: Temp:  [97.2 F (36.2 C)-98.8 F (37.1 C)] 98.1 F (36.7 C) (06/02 0306) Pulse Rate:  [73-139] 139 (06/02 0500) Cardiac Rhythm: Atrial fibrillation (06/02 0045) Resp:  [9-24] 12 (06/02 0500) BP: (87-130)/(54-72) 94/54 (06/02 0306) SpO2:  [96 %-100 %] 96 % (06/02 0500) Arterial Line BP: (118-137)/(49-59) 118/59 (06/02 0306) Weight:  [59.7 kg] 59.7 kg (06/02 0500)     Intake/Output from previous day: 06/01 0701 - 06/02 0700 In: 2979.7 [I.V.:1429.7; IV Piggyback:1550] Out: 2240 [Urine:1090; Chest Tube:400]   Physical Exam: General: Awake, alert, oriented Cardiovascular: Tachycardic Pulmonary: Clear to auscultation on the left and coarse on the right Abdomen: Soft, non tender, bowel sounds present. Extremities: SCDs in place Wounds: Clean and dry.   Chest Tube: to suction;+1 air leak with cough  Lab Results: CBC: Recent Labs    12/04/18 1054 12/05/18 0435  WBC 4.1 5.8  HGB 8.5* 8.5*  HCT 28.3* 28.0*  PLT 239 248   BMET:  Recent Labs    12/05/18 0435  NA 131*  K 4.3  CL 99  CO2 25  GLUCOSE 81  BUN 6*  CREATININE 0.57*  CALCIUM 8.3*    PT/INR:  Recent Labs    12/05/18 0435  LABPROT 15.1  INR 1.2   ABG:  INR: Will add last result for INR, ABG once components are confirmed Will add last 4 CBG results once components are confirmed  Assessment/Plan:  1. CV - HR into the 130-140's this am; per primary 2.  Pulmonary - On 2 liters of oxygen via [place]. Chest tube with 400 cc since surgery. Chest tube is to suction and there is a +1 air leak with cough. CXR this am appears stable.  Encourage incentive spirometer. Gram stain of pleural fluid showed gram positive cocci;await culture 3. ID- on Cefazolin 2 grams IV tid and Rifampin 300 mg orally bid 4. Hyponatremia resolved as sodium this am up to 136 6. Anemia-H and H slightly decreased to 8.1 and 27.6 5. Decrease IVF and remove a line  Donielle M ZimmermanPA-C 12/06/2018,7:33 AM [phone]   Chart reviewed, patient examined, agree with above. CXR looks ok Small air leak from chest tube. Continue to suction.

## 2018-12-06 NOTE — Consult Note (Addendum)
Cardiology Consultation:   Patient ID: Randy Page; [id number]; Mar 21, 1956   Admit date: 12/02/2018 Date of Consult: 12/06/2018  Primary Care Provider: [name], MD Primary Cardiologist: Dr. Lennox Pippins  Patient Profile:   Randy Page is a 63 y.o. male with a hx of HTN, HLD, recent GI bleed secondary to duodenal ulcer, anemia, chronic neck pain s/p cervical fusion and longtime tobacco use who is being seen today for the evaluation of orthostatic hypotension, tachycardia at the request of Dr. Wynelle Cleveland.  History of Present Illness:   Randy Page is a 63yo M with a hx as stated above who presented to [facility] on 12/02/2018 after transfer from [facility] for management of a right-sided empyema with MSSA bacteremia.  Per chart review, the patient had a fall prior to admission and presented to [facility] in which a work-up showed a lactic acid of 12.3 and a large air-fluid collection in the right hemothorax consistent with empyema with collapse and near collapse of RML and RLL.   Prior to this presentation the patient was hospitalized 4/27-4/30/2020 at [facility] for anemia due to GI bleed and underwent EGD which revealed a duodenal ulcer which was clipped.  He was discharged with a hemoglobin of 7.7.  He then returned to [facility] after c/o generalized weakness and a mechanical fall. Workup as above showed MSSA bacteremia and empyema. Echocardiogram performed 12/01/2018 was negative for vegetation. CT guided thoracostomy by IR on 12/01/18 with 1 L of frank purulent material obtained. Cardiothoracic surgery were consulted and recommended transfer to [facility] for CT surgery evaluation and management. CT surgery was consulted on 12/03/2018 for which he was scheduled for surgical intervention on 12/05/2018 for VATS. General surgery was also consulted given significant pressure ulcer located on his L buttock on presentation. Plan was for debridement during VATS procedure if okay with  TCTS. Infectious disease consulted for antibiotic management with plans to treat with IV antibiotics for two weeks post VATS with cefazolin and rifampin. On 12/06/2018 the patients HR sustained in the 130-140 range. He was having issues with hypotension and his antihypertensives were being held. He had significant issues with orthostatics (90/65 from 132/61 with c/o dizziness after standing) as well and was given IVF(2 more liters this AM) hydration without much relief.   On my interview today, patient states that since his last hospitalization for blood loss anemia in the setting of GI bleed he has had issues with orthostatic dizziness and hypotension.  He states this is likely why he endured a fall prior to hospital presentation.  He reports that his generalized weakness has progressed over the last several weeks.  Of note, during his last hospitalization he had acute, symptomatic blood loss anemia in which he received 4 units of PRBCs.  He denies chest pain, LE swelling, shortness of breath, nausea, vomiting, diaphoresis or syncope.    He was last seen by his primary cardiologist as a new referral 09/15/2017 for the evaluation of SOB on exertion and vascular disease at the request of [name], [name], MD. He was noted to have been an active smoker since the age of 63 years old and was noted to have led a sedentary life secondary to orthopedic issues dealing with his neck. He had his cervical and thoracic issues previously been evaluated with CT scans in which there was note of coronary calcifications as well as stenosis in the coronary arteries per ultrasound and Doppler studies.  Given his referral complaints of SOB,  a Lexiscan stress test was scheduled and performed on 10/07/2017 that showed no reversible ischemia and an LVEF of 60% with normal wall motion. There was 1 medium sized, moderate intensity fixed inferior bowel attenuation artifact but otherwise a normal low risk study.  He was to have a  follow-up echocardiogram on 11/02/2017 however it appears that this was never completed. Additionally, carotid angiography was ordered for further evaluation of carotid anatomy however this appears to have never been performed as well. He has not been seen in follow-up since this time.  Cardiology has been consulted for orthostatic hypotension, tachycardia.    Past Medical History:  Diagnosis Date  . Chronically on opiate therapy   . Hypercholesteremia   . Hyperlipidemia   . Hypertension   . Low back pain   . Thyrotoxicosis   . Vitamin D deficiency     Past Surgical History:  Procedure Laterality Date  . VIDEO ASSISTED THORACOSCOPY (VATS)/EMPYEMA Right 12/05/2018   Procedure: RIGHT VIDEO ASSISTED THORACOSCOPY DRAIN EMPYEMA/LUNG ABSCESS;  Surgeon: Gaye Pollack, MD;  Location: [place];  Service: Thoracic;  Laterality: Right;  . WOUND DEBRIDEMENT Right 12/05/2018   Procedure: Excisional Debridement Right Sacral Wound;  Surgeon: Greer Pickerel, MD;  Location: [place];  Service: General;  Laterality: Right;     Prior to Admission medications   Medication Sig Start Date End Date Taking? Authorizing Provider  aspirin EC 81 MG tablet Take 1 tablet (81 mg total) by mouth daily. 09/15/17  Yes Revankar, Reita Cliche, MD  atorvastatin (LIPITOR) 20 MG tablet Take 20 mg by mouth at bedtime. 09/02/17  Yes [provider]  diazepam (VALIUM) 2 MG tablet Take 2 mg by mouth at bedtime. 09/06/17  Yes [provider]  doxepin (SINEQUAN) 100 MG capsule Take 100 mg by mouth at bedtime. 09/03/17  Yes [provider]  gabapentin (NEURONTIN) 600 MG tablet Take 600 mg by mouth 4 (four) times daily. 08/03/17  Yes [provider]  methimazole (TAPAZOLE) 5 MG tablet Take 5 mg by mouth 2 (two) times daily. 08/02/17  Yes [provider]  NARCAN 4 MG/0.1ML LIQD nasal spray kit Place 1 spray into the nose once.  09/06/17  Yes [provider]  oxyCODONE (ROXICODONE) 15 MG immediate  release tablet Take 15 mg by mouth 4 (four) times daily. 09/06/17  Yes [provider]  pantoprazole (PROTONIX) 40 MG tablet Take 40 mg by mouth daily. 09/03/17  Yes [provider]  PROAIR HFA 108 (90 Base) MCG/ACT inhaler Inhale 2 puffs into the lungs 3 (three) times daily.  09/03/17  Yes [provider]  tiZANidine (ZANAFLEX) 4 MG capsule Take 4 mg by mouth at bedtime.   Yes [provider]    Inpatient Medications: Scheduled Meds: . atorvastatin  20 mg Oral QHS  . bisacodyl  10 mg Oral Daily  . doxepin  100 mg Oral QHS  . feeding supplement (ENSURE ENLIVE)  237 mL Oral TID BM  . gabapentin  600 mg Oral QID  . metoCLOPramide (REGLAN) injection  10 mg Intravenous Q6H  . morphine   Intravenous Q4H  . multivitamin with minerals  1 tablet Oral Daily  . nicotine  14 mg Transdermal Daily  . pantoprazole  40 mg Oral Daily  . rifampin  300 mg Oral Q12H  . senna-docusate  1 tablet Oral QHS  . tiZANidine  4 mg Oral QHS   Continuous Infusions: .  ceFAZolin (ANCEF) IV 2 g (12/06/18 0535)  . dextrose  5% lactated ringers 10 mL/hr at 12/06/18 0805   PRN Meds: acetaminophen **OR** [DISCONTINUED] acetaminophen, albuterol, diphenhydrAMINE **OR** diphenhydrAMINE, naloxone **AND** sodium chloride flush, ondansetron **OR** [DISCONTINUED] ondansetron (ZOFRAN) IV, oxyCODONE, senna-docusate, zolpidem  Allergies:    Allergies  Allergen Reactions  . Aspirin Other (See Comments)    Patient states he cannot take ASA due to a stomach ulcer.    Social History:   Social History   Socioeconomic History  . Marital status: Single    Spouse name: Not on file  . Number of children: Not on file  . Years of education: Not on file  . Highest education level: Not on file  Occupational History  . Not on file  Social Needs  . Financial resource strain: Not on file  . Food insecurity:    Worry: Not on file    Inability: Not on file  . Transportation needs:    Medical: Not  on file    Non-medical: Not on file  Tobacco Use  . Smoking status: Current Every Day Smoker  . Smokeless tobacco: Never Used  Substance and Sexual Activity  . Alcohol use: Not on file  . Drug use: Not on file  . Sexual activity: Not on file  Lifestyle  . Physical activity:    Days per week: Not on file    Minutes per session: Not on file  . Stress: Not on file  Relationships  . Social connections:    Talks on phone: Not on file    Gets together: Not on file    Attends religious service: Not on file    Active member of club or organization: Not on file    Attends meetings of clubs or organizations: Not on file    Relationship status: Not on file  . Intimate partner violence:    Fear of current or ex partner: Not on file    Emotionally abused: Not on file    Physically abused: Not on file    Forced sexual activity: Not on file  Other Topics Concern  . Not on file  Social History Narrative  . Not on file    Family History:   History reviewed. No pertinent family history. Family Status:  No family status information on file.    ROS:  Please see the history of present illness.  All other ROS reviewed and negative.     Physical Exam/Data:   Vitals:   12/06/18 0000 12/06/18 0306 12/06/18 0500 12/06/18 0838  BP: (!) 87/56 (!) 94/54  125/75  Pulse: 77 (!) 136 (!) 139 100  Resp: (!) 24 (!) 9 12 17   Temp: 98.2 F (36.8 C) 98.1 F (36.7 C)    TempSrc: Oral Oral    SpO2: 100% 98% 96% 100%  Weight:   59.7 kg   Height:        Intake/Output Summary (Last 24 hours) at 12/06/2018 1052 Last data filed at 12/06/2018 0400 Gross per 24 hour  Intake 2979.66 ml  Output 2015 ml  Net 964.66 ml   Filed Weights   12/04/18 0354 12/05/18 0639 12/06/18 0500  Weight: 57.2 kg 55.1 kg 59.7 kg   Body mass index is 19.44 kg/m.   General: Frail, elderly, NAD Skin: Warm, dry, intact.  Decubitus ulcer to buttocks with CTI dressing Head: Normocephalic, atraumatic, lear, moist mucus  membranes. Neck: Negative for carotid bruits. No JVD Lungs: Rhonchorous bilaterally with productive sputum. Breathing is unlabored. Cardiovascular: RRR with S1 S2. No murmurs. Abdomen: Soft, non-tender,  non-distended. No obvious abdominal masses. MSK: Strength and tone appear normal for age. 4/4 in all extremities Extremities: No edema. No clubbing or cyanosis. DP/PT pulses 1+ bilaterally Neuro: Alert and oriented. No focal deficits. No facial asymmetry. MAE spontaneously. Psych: Responds to questions appropriately with normal affect.     EKG:  The EKG was personally reviewed and demonstrates: 12/06/2018 sinus tachycardia with evidence of LVH and non-specific TW abnormalities, HR 131 Telemetry:  Telemetry was personally reviewed and demonstrates: 62,020 sinus tachycardia with a variable rates, 100- 140s with sitting  Relevant CV Studies:  ECHO: None   Lexiscan stress test 10/07/2017:   Nuclear stress EF: 60%.  The left ventricular ejection fraction is normal (55-65%).  No T wave inversion was noted during stress.  There was no ST segment deviation noted during stress.  Defect 1: There is a medium defect of moderate severity.  This is a low risk study.   Medium size, moderate intensity fixed inferior bowel attenuation artifact. No reversible ischemia. LVEF 60% with normal wall motion. This is a low risk study.  Laboratory Data:  Chemistry Recent Labs  Lab 12/03/18 0217 12/05/18 0435 12/06/18 0805  NA 136 131* 136  K 3.4* 4.3 5.0  CL 102 99 102  CO2 25 25 28   GLUCOSE 93 81 128*  BUN 8 6* 8  CREATININE 0.60* 0.57* 0.74  CALCIUM 7.4* 8.3* 8.2*  GFRNONAA >60 >60 >60  GFRAA >60 >60 >60  ANIONGAP 9 7 6     Total Protein  Date Value Ref Range Status  12/03/2018 5.3 (L) 6.5 - 8.1 g/dL Final   Albumin  Date Value Ref Range Status  12/03/2018 1.3 (L) 3.5 - 5.0 g/dL Final   AST  Date Value Ref Range Status  12/03/2018 13 (L) 15 - 41 U/L Final   ALT  Date Value  Ref Range Status  12/03/2018 8 0 - 44 U/L Final   Alkaline Phosphatase  Date Value Ref Range Status  12/03/2018 95 38 - 126 U/L Final   Total Bilirubin  Date Value Ref Range Status  12/03/2018 0.7 0.3 - 1.2 mg/dL Final   Hematology Recent Labs  Lab 12/04/18 1054 12/05/18 0435 12/06/18 0805  WBC 4.1 5.8 6.0  RBC 2.94* 2.94* 2.84*  HGB 8.5* 8.5* 8.1*  HCT 28.3* 28.0* 27.6*  MCV 96.3 95.2 97.2  MCH 28.9 28.9 28.5  MCHC 30.0 30.4 29.3*  RDW 18.5* 18.3* 18.1*  PLT 239 248 273   Cardiac EnzymesNo results for input(s): TROPONINI in the last 168 hours. No results for input(s): TROPIPOC in the last 168 hours.  BNPNo results for input(s): BNP, PROBNP in the last 168 hours.  DDimer No results for input(s): DDIMER in the last 168 hours. TSH: No results found for: TSH Lipids:No results found for: CHOL, HDL, LDLCALC, LDLDIRECT, TRIG, CHOLHDL HgbA1c:No results found for: HGBA1C  Radiology/Studies:  Dg Chest 2 View  Result Date: 12/03/2018 CLINICAL DATA:  Empyema EXAM: CHEST - 2 VIEW COMPARISON:  12/01/2018, CT 11/28/2018, radiograph 11/27/2018 FINDINGS: Right basilar drainage catheter. No change in residual pleural and parenchymal disease at the right base. Left lung is clear. IMPRESSION: No significant interval change in right basilar drainage catheter and residual pleural and parenchymal disease at the right base. Electronically Signed   By: Donavan Foil M.D.   On: 12/03/2018 04:09   Dg Chest Port 1 View  Result Date: 12/06/2018 CLINICAL DATA:  Lung infection, empyema, pain on right side EXAM: PORTABLE CHEST 1 VIEW COMPARISON:  12/05/2018 FINDINGS: Right basilar chest tube remains in place. Loculated right basilar hydropneumothorax, stable since prior study. Continued airspace opacity in the right lower lobe. Left lung clear. Heart is normal size. IMPRESSION: Right basilar chest tube remains in place with stable loculated right basilar hydropneumothorax. Right lower lobe airspace opacity  unchanged. Electronically Signed   By: Rolm Baptise M.D.   On: 12/06/2018 08:12   Dg Chest Port 1 View  Result Date: 12/05/2018 CLINICAL DATA:  Empyema. EXAM: PORTABLE CHEST 1 VIEW COMPARISON:  Dec 03, 2018 FINDINGS: A right chest tube is seen. Opacity remains in the right base, improved. There appears to be a prominent skin fold over the right apex with lung markings on both sides. No convincing evidence of pneumothorax. The left lung is clear. No other change. IMPRESSION: 1. There is a right chest tube which appears to be unchanged since the previous study. Persistent opacity remains in the right base. There is a skin fold over the right apex but no definitive evidence of pneumothorax. Recommend attention to this region on follow-up. Electronically Signed   By: Dorise Bullion III M.D   On: 12/05/2018 19:21   [place] Ekg Site Rite  Result Date: 12/06/2018 If Site Rite image not attached, placement could not be confirmed due to current cardiac rhythm.  Assessment and Plan:   1.  Orthostatic hypotension with prior episodes of SVT/presumed PAF/MAT: -Patient with significant symptomatic orthostatics on 12/04/2018 per PT.  Initially BP 132/61 with a drop to 90/65 with standing. -Patient treated with IV fluid hydration without much improvement, 2 additional liters of normal saline given today and continues to be orthostatic -Patient with a recent hospitalization at [facility] for GI bleed and symptomatic anemia secondary to ulcer in which he received 4 units of PRBCs with a discharge hemoglobin of 7.7 -Hemoglobin today, 8.1 and noted to be 9.7 on 5/24 at [facility] print out from 11/29/2018 with what appears to be SVT treated with IV diltiazem. Pt continues to have variable HR with episodes of what appears to be paroxysmal atrial fibrillation, SVT and possibly MAT with rates in the low 100's while laying and HR in the 140's with sitting. Unclear if this is volume related>> given his  current poor health and recent GI bleed he is not a candidate for anticoagulation -Orthostatics likely multifactorial, could be in the setting of fluid volume loss>>would consider transfusing with PRBCs -Hold antihypertensives -Echocardiogram from [facility] with with LVEF of 55 to 60% with moderate tricuspid regurgitation with follow-up TEE for preprocedural, transfer negative for vegetation.    2. MSSA bacteremia with empyema s/p VATS 12/05/2018: -Presented initally to [facility] s/p fall and generalized weakness found to have elevated lactic acid and purulent empyema on CXR and subsequent CT.  -Pt transeferred to [facility] on 12/03/2018 for surgical intervetnion with VATS procedure per TCTS on 12/05/2018 -Plan for IV antibiotics for 2 weeks post procedure with PICC line in place  -Management per IM, TCTS, infectious disease   3.  Pulmonary nodule found on chest CT with long history of tobacco use: -Chest CT performed at [facility] showed 9 mm pulmonary nodule in the right upper lobe thought to be infectious.  Also with tree-in-bud opacities involving the right upper lobe and right lower lobe concerning for aspiration or infectious bronchiolitis with debris noted in the trachea -Needs follow-up at hospital discharge -Smoking cessation strongly encouraged   4. Coronary calcifications on CT: -Patient recently referred to Dr. Geraldo Pitter for further coronary  work-up given coronary calcifications found on cervical and thoracic CT scanning and recent complaints of shortness of breath on exertion -Reports shortness of breath has essentially resolved, likely this was development of empyema? -Denies anginal symptoms including chest pain, diaphoresis, nausea, vomiting, orthopnea symptoms or PND -Lexiscan stress test on 10/07/2017 which was found to be normal with no ST segment deviation and no reversible ischemia with an LVEF of 60% with normal wall motion. -Plan was for follow-up echocardiogram which  was ordered however this was never completed.  Additionally patient was to undergo CT angiogram of neck to evaluate carotid arteries and again this was ordered however never completed. -Echocardiogram from [facility] on 12/01/2018 was negative for vegetation.    5. Decubitus ulcer, buttocks present on admission: -Treated per general surgery with debridement -Albumin, 1.3 on hospital presentation -Clearly very poor overall health with malnutrition>> needs albumin replacement which would likely help his intra volume status -Management per IM, general surgery   6. HLD: -Continue atorvastatin   7. Hypokalemia: -Resolved, K+ 5.0 today   For questions or updates, please contact [place] Please consult [email or web address] for contact info under Cardiology/STEMI.   Lyndel Safe NP-C [facility] Pager: [phone] 12/06/2018 10:52 AM   Attending Note:   The patient was seen and examined.  Agree with assessment and plan as noted above.  Changes made to the above note as needed.  Patient seen and independently examined with Kathyrn Drown. NP .   We discussed all aspects of the encounter. I agree with the assessment and plan as stated above.  1.   Tachycardia:   I reviewed the telemetry strips from [facility] as well as the monitor strips from [facility].  He has lots of different arrhythmias.  He has sinus rhythm at times.  At other times he has multifocal atrial tachycardia which is likely secondary to he has COPD.  He also has short bursts of an irregular rhythm that could be atrial fibrillation although it is very short in duration for atrial fibrillation.  Occasionally will have tachycardia that seems to be very regular narrow spaced which would suggest a reentrant tachycardia but again these episodes only last for short period of time which makes it unlikely.  I think that these episodes are all reflective of his overall poor health.  He has COPD.  He has  chronic wasting.  His albumin is 1.3.  He has an empyema with evidence of pneumonia in his lung.  He has a large decubitus ulcer on his buttocks.  All of these are signs of overall very poor health.  I think that his cardiac arrhythmias will improve with improvement of his nutrition and overall health but I think this is unlikely.  These episodes are also associated with orthostatic hypotension.  I think treatment of these episodes of tachycardia with diltiazem would only result in drop of his blood pressure and worsening orthostasis.  At this point I do not think that these episodes of tachycardia need any primary treatment.  The arrhythmias himself are relatively benign.   2.  Orthostatic hypotension: The patient has very poor nutritional status.  His albumin is 1.3.  He is anemic with a hemoglobin of 8.1.   He admits that his diet has not been very good for quite some time.   I think these are contributing to his episodes of orthostasis.. We could perhaps try Middaugh drain but I think that the overall underlying cause should be addressed with improved nutrition  and treatment of his underlying infection/empyema.  3.  Right-sided empyema: He has a CT scan that shows pneumonia and an empyema.  He has evidence of aspiration and there was debris in his trachea at the time of the CT scan.  I suspect that he may have chronic aspiration.  He has had the empyema drained.  Further recommendations per the internal medicine team or pulmonary team.  4.  Decubitus ulcer: He has a large decubitus ulcer on his buttocks.  This is been seen by surgery.  This is another reflection of his overall very poor health and immobility.     I have spent a total of 40 minutes with patient reviewing hospital  notes , telemetry, EKGs, labs and examining patient as well as establishing an assessment and plan that was discussed with the patient. > 50% of time was spent in direct patient care.  [facility] will sign off.    Medication Recommendations:  No changes in medical therapy Other recommendations (labs, testing, etc):   He needs   improvement in his nutrition,  Follow up as an outpatient:   With his primary MD    Thayer Headings, Brooke Bonito., MD, [facility] 12/06/2018, 2:18 PM [address],  [place] Pager [phone]

## 2018-12-06 NOTE — Progress Notes (Signed)
Foley discontinued w/o difficulty.  9 cc saline removed from bulb.  Will continue to observe

## 2018-12-06 NOTE — Progress Notes (Signed)
[facility]/Trauma Progress Note  1 Day Post-Op   Assessment/Plan Principal Problem:   MSSA bacteremia Active Problems:   Essential hypertension   Cigarette smoker   Hyperlipidemia   Empyema of right pleural space (HCC)   Chronic pain   Duodenal ulcer   Decubitus ulcer   Elevated INR  R Empyema - S/P VATS, Dr. Laneta SimmersBartle, 06/01  L buttock/sacral decub - S/P excisional debridement of right sacral wound, Dr. Andrey CampanileWilson, 06/01 - wet to dry dressing changes qshift - antibiotics per ID  Follow up: [facility] clinic 2-3 weeks for wound check    LOS: 4 days    Subjective/Chief complaint  Buttock pain. Pain controlled with PCA.   Objective: Vital signs in last 24 hours: Temp:  [97.2 F (36.2 C)-98.8 F (37.1 C)] 98.1 F (36.7 C) (06/02 0306) Pulse Rate:  [73-139] 139 (06/02 0500) Resp:  [9-24] 12 (06/02 0500) BP: (87-130)/(54-72) 94/54 (06/02 0306) SpO2:  [96 %-100 %] 96 % (06/02 0500) Arterial Line BP: (118-137)/(49-59) 118/59 (06/02 0306) Weight:  [59.7 kg] 59.7 kg (06/02 0500) Last BM Date: 11/29/18  Intake/Output from previous day: 06/01 0701 - 06/02 0700 In: 2979.7 [I.V.:1429.7; IV Piggyback:1550] Out: 2240 [Urine:1090; Chest Tube:400] Intake/Output this shift: No intake/output data recorded.  PE: Gen:  Alert, NAD, pleasant, cooperative Pulm:  Rate and effort normal GU: sacral wound looks clean and is without purulent drainage, see photo below Skin: no rashes noted, warm and dry      Anti-infectives: Anti-infectives (From admission, onward)   Start     Dose/Rate Route Frequency Ordered Stop   12/02/18 2330  ceFAZolin (ANCEF) IVPB 2g/100 mL premix     2 g 200 mL/hr over 30 Minutes Intravenous Every 8 hours 12/02/18 2322     12/02/18 2330  rifampin (RIFADIN) capsule 300 mg     300 mg Oral Every 12 hours 12/02/18 2322        Lab Results:  Recent Labs    12/04/18 1054 12/05/18 0435  WBC 4.1 5.8  HGB 8.5* 8.5*  HCT 28.3* 28.0*  PLT 239 248    BMET Recent Labs    12/05/18 0435  NA 131*  K 4.3  CL 99  CO2 25  GLUCOSE 81  BUN 6*  CREATININE 0.57*  CALCIUM 8.3*   PT/INR Recent Labs    12/04/18 0342 12/05/18 0435  LABPROT 16.6* 15.1  INR 1.4* 1.2   CMP     Component Value Date/Time   NA 131 (L) 12/05/2018 0435   NA 139 09/15/2017 1040   K 4.3 12/05/2018 0435   CL 99 12/05/2018 0435   CO2 25 12/05/2018 0435   GLUCOSE 81 12/05/2018 0435   BUN 6 (L) 12/05/2018 0435   BUN 5 (L) 09/15/2017 1040   CREATININE 0.57 (L) 12/05/2018 0435   CALCIUM 8.3 (L) 12/05/2018 0435   PROT 5.3 (L) 12/03/2018 0217   ALBUMIN 1.3 (L) 12/03/2018 0217   AST 13 (L) 12/03/2018 0217   ALT 8 12/03/2018 0217   ALKPHOS 95 12/03/2018 0217   BILITOT 0.7 12/03/2018 0217   GFRNONAA >60 12/05/2018 0435   GFRAA >60 12/05/2018 0435   Lipase  No results found for: LIPASE  Studies/Results: Dg Chest Port 1 View  Result Date: 12/06/2018 CLINICAL DATA:  Lung infection, empyema, pain on right side EXAM: PORTABLE CHEST 1 VIEW COMPARISON:  12/05/2018 FINDINGS: Right basilar chest tube remains in place. Loculated right basilar hydropneumothorax, stable since prior study. Continued airspace opacity in the right lower  lobe. Left lung clear. Heart is normal size. IMPRESSION: Right basilar chest tube remains in place with stable loculated right basilar hydropneumothorax. Right lower lobe airspace opacity unchanged. Electronically Signed   By: Charlett Nose M.D.   On: 12/06/2018 08:12   Dg Chest Port 1 View  Result Date: 12/05/2018 CLINICAL DATA:  Empyema. EXAM: PORTABLE CHEST 1 VIEW COMPARISON:  Dec 03, 2018 FINDINGS: A right chest tube is seen. Opacity remains in the right base, improved. There appears to be a prominent skin fold over the right apex with lung markings on both sides. No convincing evidence of pneumothorax. The left lung is clear. No other change. IMPRESSION: 1. There is a right chest tube which appears to be unchanged since the previous study.  Persistent opacity remains in the right base. There is a skin fold over the right apex but no definitive evidence of pneumothorax. Recommend attention to this region on follow-up. Electronically Signed   By: Gerome Sam III M.D   On: 12/05/2018 19:21      Jerre Simon , [facility] 12/06/2018, 8:27 AM  Pager: [phone] Mon-Wed, Friday 7:00am-4:30pm Thurs 7am-11:30am  Consults: [phone]

## 2018-12-06 NOTE — Progress Notes (Signed)
PROGRESS NOTE    Randy Page   [id number]  DOB: 09/24/1955  DOA: 12/02/2018 PCP: Randy Page, Randy M, MD   Brief Narrative:  Randy Page 63 y.o. male with medical history significant for hypertension, hyperlipidemia, recent GI bleed due to duodenal ulcer, anemia, chronic neck pain status post cervical fusion who is transferred [name] for further management of right-sided empyema and MSSA bacteremia. He presented to [name] after a fall at home. He was found to be hypoglycemia and hypotensive (SBP 80s) by EMS.  Lactic acid was 12.3 CT chest > large air and fluid collection in right hemithorax favored to represent a large empyema, complete collapse of RML and near compete collapse of RLL. Tree in bud opacities in RUL and ELL. Emphysematous changes.   He was [name] from 10/31/18- 11/03/18 for GI bleed found to be due to a duodenal ulcer.    Subjective: Ongoing pain in chest and buttocks.     Assessment & Plan:   Principal Problem:   MSSA bacteremia, HCAP,  empyema, acute respiratory failure - underwent chest tube on 5/27 - plan at [name] for 3 wks of Cefazolin and Rifampin - I have asked for an ID consult as well  -   repeat blood cultures negative  - repeat pleural fluid cultures pending - CXR 5/30> no significant change in residual pleural and parenchymal disease at the right base - [facility] to evaluate - 6/1- underwent uncompliacated VATS by Dr Laneta SimmersBartle - wean O2 as able - ID recommend PICC and 2 wks of Cefazolin and Rifampin after VATS- Have ordered PICC  Active Problems:   Hypotension with h/o Essential hypertension - holding antihypertensives - 5/31>  PT note from yesterday mentions orthostatic hypotension BP drop to 90/65 from 132/61 and he became lightheaded after 45 sec.  - despite fluid replacement, orthostatics continue to be + - TEDS ordered by he declined them- I  have explained the rational for orthostatic hypotension and he is in agreement - Cortisol level 9.7 is a little low for his level of illness- - noted to be tachycardic and hypotensive post op last night (even when asleep and thus pain was likely not playing a role)- given 2 more L NS  -  Cosyntropin test ordered and administered today but ACTH not drawn by lab - recheck orthostatics today    Decubitus ulcer b/l but much larger on right buttock- present on admission - followed by wound care clinic as outpt    - consulted general surgery - underwent debridement after VATS in OR - added air mattress  Hypoalbuminemia, underweight Body mass index is 19.44 kg/Page. - in context of current illness - have added Ensure and requested an nutrition eval- recommendations> TID Ensure  Elevated INR -  cont vit K x 3 days  - INR is noted to be improved    Hyperlipidemia - cont Atorvastatin  Hypokalemia -replaced      Chronic pain in neck and back - s/p cervical fusion - cont Oxy IR 15 mg Q4 hrs PRN and Gabapentin    Duodenal ulcer with GI bleed s/p clipping 4/20 - cont Protonix  Anemia- normocytic -  Hb 7.4 -8.5 range -  no prior Hb to compare with -  anemia panel consistent with AOCD  Nicotine abuse - counseled to quit- he states he would like to but his girlfriend also smokes - cont Nicotine patch  Hyperthyroid -cont  Tapazol  Deconditioning/ fall on admission - PT eval ordered- recommending HHPT  Time spent in minutes: 35 min DVT prophylaxis: SCDs Code Status: Full code Family Communication:  Disposition Plan: HHPT recommended Consultants:   CT surgery  General surgery Procedures:   Chest tube  VATS  I and D of decubitus Antimicrobials:  Anti-infectives (From admission, onward)   Start     Dose/Rate Route Frequency Ordered Stop   12/02/18 2330  ceFAZolin (ANCEF) IVPB 2g/100 mL premix     2 g 200 mL/hr over 30 Minutes Intravenous Every 8 hours 12/02/18 2322      12/02/18 2330  rifampin (RIFADIN) capsule 300 mg     300 mg Oral Every 12 hours 12/02/18 2322         Objective: Vitals:   12/05/18 2000 12/06/18 0000 12/06/18 0306 12/06/18 0500  BP: (!) 102/54 (!) 87/56 (!) 94/54   Pulse: 95 77 (!) 136 (!) 139  Resp: 13 (!) 24 (!) 9 12  Temp: 98.2 F (36.8 C) 98.2 F (36.8 C) 98.1 F (36.7 C)   TempSrc: Oral Oral Oral   SpO2: 100% 100% 98% 96%  Weight:    59.7 kg  Height:        Intake/Output Summary (Last 24 hours) at 12/06/2018 0746 Last data filed at 12/06/2018 0400 Gross per 24 hour  Intake 2979.66 ml  Output 2240 ml  Net 739.66 ml   Filed Weights   12/04/18 0354 12/05/18 0639 12/06/18 0500  Weight: 57.2 kg 55.1 kg 59.7 kg    Examination:  General exam: Appears comfortable  HEENT: PERRLA, oral mucosa moist, no sclera icterus or thrush Respiratory system: Crackles in RLL, bloody fluid in chest tube- Respiratory effort normal. Cardiovascular system: S1 & S2 heard,  No murmurs - tachycardic  Gastrointestinal system: Abdomen soft, non-tender, nondistended. Normal bowel sounds   Central nervous system: Alert and oriented. No focal neurological deficits. Extremities: No cyanosis, clubbing or edema Skin: dressing just changed by surgery and thus I did no re-open it today Psychiatry:  Mood & affect appropriate.  Psychiatry:  Mood & affect appropriate.   Data Reviewed: I have personally reviewed following labs and imaging studies  CBC: Recent Labs  Lab 12/02/18 2341 12/03/18 0217 12/04/18 1054 12/05/18 0435  WBC 4.2 4.8 4.1 5.8  NEUTROABS 2.6  --   --   --   HGB 7.7* 7.4* 8.5* 8.5*  HCT 25.6* 24.6* 28.3* 28.0*  MCV 95.9 96.1 96.3 95.2  PLT 177 181 239 248   Basic Metabolic Panel: Recent Labs  Lab 12/02/18 2341 12/03/18 0217 12/05/18 0435  NA 134* 136 131*  K 3.4* 3.4* 4.3  CL 103 102 99  CO2 GLUCOSE 103* 93 81  BUN 9 8 6*  CREATININE 0.60* 0.60* 0.57*  CALCIUM 7.2* 7.4* 8.3*   GFR: Estimated  Creatinine Clearance: 79.8 mL/min (A) (by C-G formula based on SCr of 0.57 mg/dL (L)). Liver Function Tests: Recent Labs  Lab 12/02/18 2341 12/03/18 0217  AST 16 13*  ALT 7 8  ALKPHOS 99 95  BILITOT 0.7 0.7  PROT 5.5* 5.3*  ALBUMIN 1.3* 1.3*   No results for input(s): LIPASE, AMYLASE in the last 168 hours. No results for input(s): AMMONIA in the last 168 hours. Coagulation Profile: Recent Labs  Lab 12/03/18 0217 12/04/18 0342 12/05/18 0435  INR 1.5* 1.4* 1.2   Cardiac Enzymes: No results for input(s): CKTOTAL, CKMB, CKMBINDEX, TROPONINI in the last 168 hours. BNP (last  3 results) No results for input(s): PROBNP in the last 8760 hours. HbA1C: No results for input(s): HGBA1C in the last 72 hours. CBG: Recent Labs  Lab 12/05/18 2152 12/06/18 0638  GLUCAP 184* 130*   Lipid Profile: No results for input(s): CHOL, HDL, LDLCALC, TRIG, CHOLHDL, LDLDIRECT in the last 72 hours. Thyroid Function Tests: No results for input(s): TSH, T4TOTAL, FREET4, T3FREE, THYROIDAB in the last 72 hours. Anemia Panel: Recent Labs    12/03/18 1050 12/04/18 0342  VITAMINB12  --  919*  FOLATE  --  7.9  FERRITIN 244  --   TIBC 134*  --   IRON 21*  --   RETICCTPCT 1.9  --    Urine analysis: No results found for: COLORURINE, APPEARANCEUR, LABSPEC, PHURINE, GLUCOSEU, HGBUR, BILIRUBINUR, KETONESUR, PROTEINUR, UROBILINOGEN, NITRITE, LEUKOCYTESUR Sepsis Labs: (procalcitonin:4,lacticidven:4) ) Recent Results (from the past 240 hour(s))  Culture, blood (Routine X 2) w Reflex to ID Panel     Status: None (Preliminary result)   Collection Time: 12/02/18 11:41 PM  Result Value Ref Range Status   Specimen Description BLOOD RIGHT HAND  Final   Special Requests   Final    BOTTLES DRAWN AEROBIC AND ANAEROBIC Blood Culture adequate volume   Culture   Final    NO GROWTH 3 DAYS Performed at [facility], [address], [place], [place] [address]    Report Status PENDING  Incomplete   Culture, blood (Routine X 2) w Reflex to ID Panel     Status: None (Preliminary result)   Collection Time: 12/02/18 11:41 PM  Result Value Ref Range Status   Specimen Description BLOOD RIGHT HAND  Final   Special Requests   Final    BOTTLES DRAWN AEROBIC AND ANAEROBIC Blood Culture results may not be optimal due to an excessive volume of blood received in culture bottles   Culture   Final    NO GROWTH 3 DAYS Performed at [facility], [address] [place], [place] [address]    Report Status PENDING  Incomplete  SARS Coronavirus 2 (CEPHEID - Performed in [facility] hospital lab), Hosp Order     Status: None   Collection Time: 12/03/18  1:40 AM  Result Value Ref Range Status   SARS Coronavirus 2 NEGATIVE NEGATIVE Final    Comment: (NOTE) If result is NEGATIVE SARS-CoV-2 target nucleic acids are NOT DETECTED. The SARS-CoV-2 RNA is generally detectable in upper and lower  respiratory specimens during the acute phase of infection. The lowest  concentration of SARS-CoV-2 viral copies this assay can detect is 250  copies / mL. A negative result does not preclude SARS-CoV-2 infection  and should not be used as the sole basis for treatment or other  patient management decisions.  A negative result may occur with  improper specimen collection / handling, submission of specimen other  than nasopharyngeal swab, presence of viral mutation(s) within the  areas targeted by this assay, and inadequate number of viral copies  (<250 copies / mL). A negative result must be combined with clinical  observations, patient history, and epidemiological information. If result is POSITIVE SARS-CoV-2 target nucleic acids are DETECTED. The SARS-CoV-2 RNA is generally detectable in upper and lower  respiratory specimens dur ing the acute phase of infection.  Positive  results are indicative of active infection with SARS-CoV-2.  Clinical  correlation with patient history and other diagnostic  information is  necessary to determine patient infection status.  Positive results do  not  rule out bacterial infection or co-infection with other viruses. If result is PRESUMPTIVE POSTIVE SARS-CoV-2 nucleic acids MAY BE PRESENT.   A presumptive positive result was obtained on the submitted specimen  and confirmed on repeat testing.  While 2019 novel coronavirus  (SARS-CoV-2) nucleic acids may be present in the submitted sample  additional confirmatory testing may be necessary for epidemiological  and / or clinical management purposes  to differentiate between  SARS-CoV-2 and other Sarbecovirus currently known to infect humans.  If clinically indicated additional testing with an alternate test  methodology [phone]) is advised. The SARS-CoV-2 RNA is generally  detectable in upper and lower respiratory sp ecimens during the acute  phase of infection. The expected result is Negative. Fact Sheet for Patients:  BoilerBrush.com.cy Fact Sheet for Healthcare Providers: https://pope.com/ This test is not yet approved or cleared by the [place] FDA and has been authorized for detection and/or diagnosis of SARS-CoV-2 by FDA under an Emergency Use Authorization (EUA).  This EUA will remain in effect (meaning this test can be used) for the duration of the COVID-19 declaration under Section 564(b)(1) of the Act, 21 U.S.C. section 360bbb-3(b)(1), unless the authorization is terminated or revoked sooner. Performed at [facility], [address] [place], [place] [address]   MRSA PCR Screening     Status: None   Collection Time: 12/04/18  8:33 PM  Result Value Ref Range Status   MRSA by PCR NEGATIVE NEGATIVE Final    Comment:        The GeneXpert MRSA Assay (FDA approved for NASAL specimens only), is one component of a comprehensive MRSA colonization surveillance program. It is not intended to diagnose MRSA infection nor to guide or  monitor treatment for MRSA infections. Performed at [facility], [address] [place], [place] [address]   Aerobic/Anaerobic Culture (surgical/deep wound)     Status: None (Preliminary result)   Collection Time: 12/05/18  3:36 PM  Result Value Ref Range Status   Specimen Description FLUID RIGHT LUNG  Final   Special Requests PATIENT ON FOLLOWING ANCEF  Final   Gram Stain   Final    ABUNDANT WBC PRESENT, PREDOMINANTLY PMN FEW GRAM POSITIVE COCCI Performed at [facility], [address], [place], [place] [address]    Culture PENDING  Incomplete   Report Status PENDING  Incomplete  Aerobic/Anaerobic Culture (surgical/deep wound)     Status: None (Preliminary result)   Collection Time: 12/05/18  3:49 PM  Result Value Ref Range Status   Specimen Description TISSUE RIGHT LUNG  Final   Special Requests PATIENT ON FOLLOWING ANCEF  Final   Gram Stain   Final    ABUNDANT WBC PRESENT, PREDOMINANTLY PMN ABUNDANT GRAM POSITIVE COCCI Performed at [facility], [address], [place], [place] [address]    Culture PENDING  Incomplete   Report Status PENDING  Incomplete         Radiology Studies: Dg Chest Port 1 View  Result Date: 12/05/2018 CLINICAL DATA:  Empyema. EXAM: PORTABLE CHEST 1 VIEW COMPARISON:  Dec 03, 2018 FINDINGS: A right chest tube is seen. Opacity remains in the right base, improved. There appears to be a prominent skin fold over the right apex with lung markings on both sides. No convincing evidence of pneumothorax. The left lung is clear. No other change. IMPRESSION: 1. There is a right chest tube which appears to be unchanged since the previous study. Persistent opacity remains in the right  base. There is a skin fold over the right apex but no definitive evidence of pneumothorax. Recommend attention to this region on follow-up. Electronically Signed   By: Gerome Sam III Page.D   On: 12/05/2018 19:21      Scheduled Meds: . atorvastatin  20 mg  Oral QHS  . bisacodyl  10 mg Oral Daily  . doxepin  100 mg Oral QHS  . feeding supplement (ENSURE ENLIVE)  237 mL Oral TID BM  . gabapentin  600 mg Oral QID  . insulin aspart  0-24 Units Subcutaneous TID AC & HS  . metoCLOPramide (REGLAN) injection  10 mg Intravenous Q6H  . morphine   Intravenous Q4H  . multivitamin with minerals  1 tablet Oral Daily  . nicotine  14 mg Transdermal Daily  . pantoprazole  40 mg Oral Daily  . rifampin  300 mg Oral Q12H  . senna-docusate  1 tablet Oral QHS  . tiZANidine  4 mg Oral QHS   Continuous Infusions: .  ceFAZolin (ANCEF) IV 2 g (12/06/18 0535)  . dextrose 5% lactated ringers 75 mL/hr at 12/06/18 0719  . sodium chloride       LOS: 4 days      Calvert Cantor, MD [facility] Pager: [email or web address] Password [facility] 12/06/2018, 7:46 AM

## 2018-12-06 NOTE — Progress Notes (Signed)
[facility]   Reason for visit: Follow up on empyema  Interval History: s/p VATS yesterday, empyema drained, repeat blood cultures remain ngtd.  Feels better today.  Nofever, no associated n/v/d.    Physical Exam: Constitutional:  Vitals:   12/06/18 0500 12/06/18 0838  BP:  125/75  Pulse: (!) 139 100  Resp: 12 17  Temp:    SpO2: 96% 100%   patient appears in NAD Eyes: anicteric Respiratory: Normal respiratory effort; CTA B Chest: chest tube in place Cardiovascular: RRR GI: soft, nt, nd  Review of Systems: Constitutional: negative for fevers and chills Gastrointestinal: negative for nausea and diarrhea Musculoskeletal: negative for myalgias and arthralgias  Lab Results  Component Value Date   WBC 6.0 12/06/2018   HGB 8.1 (L) 12/06/2018   HCT 27.6 (L) 12/06/2018   MCV 97.2 12/06/2018   PLT 273 12/06/2018    Lab Results  Component Value Date   CREATININE 0.74 12/06/2018   BUN 8 12/06/2018   NA 136 12/06/2018   K 5.0 12/06/2018   CL 102 12/06/2018   CO2 28 12/06/2018    Lab Results  Component Value Date   ALT 8 12/03/2018   AST 13 (L) 12/03/2018   ALKPHOS 95 12/03/2018     Microbiology: Recent Results (from the past 240 hour(s))  Culture, blood (Routine X 2) w Reflex to ID Panel     Status: None (Preliminary result)   Collection Time: 12/02/18 11:41 PM  Result Value Ref Range Status   Specimen Description BLOOD RIGHT HAND  Final   Special Requests   Final    BOTTLES DRAWN AEROBIC AND ANAEROBIC Blood Culture adequate volume   Culture   Final    NO GROWTH 3 DAYS Performed at [facility], [address] [place], [place] [address]    Report Status PENDING  Incomplete  Culture, blood (Routine X 2) w Reflex to ID Panel     Status: None (Preliminary result)   Collection Time: 12/02/18 11:41 PM  Result Value Ref Range Status   Specimen Description BLOOD RIGHT HAND  Final   Special Requests   Final    BOTTLES DRAWN  AEROBIC AND ANAEROBIC Blood Culture results may not be optimal due to an excessive volume of blood received in culture bottles   Culture   Final    NO GROWTH 3 DAYS Performed at [facility], [address] [place], [place] [address]    Report Status PENDING  Incomplete  SARS Coronavirus 2 (CEPHEID - Performed in [facility] hospital lab), Hosp Order     Status: None   Collection Time: 12/03/18  1:40 AM  Result Value Ref Range Status   SARS Coronavirus 2 NEGATIVE NEGATIVE Final    Comment: (NOTE) If result is NEGATIVE SARS-CoV-2 target nucleic acids are NOT DETECTED. The SARS-CoV-2 RNA is generally detectable in upper and lower  respiratory specimens during the acute phase of infection. The lowest  concentration of SARS-CoV-2 viral copies this assay can detect is 250  copies / mL. A negative result does not preclude SARS-CoV-2 infection  and should not be used as the sole basis for treatment or other  patient management decisions.  A negative result may occur with  improper specimen collection / handling, submission of specimen other  than nasopharyngeal swab, presence of viral mutation(s) within the  areas targeted by this assay, and inadequate number of viral copies  (<250 copies / mL). A negative  result must be combined with clinical  observations, patient history, and epidemiological information. If result is POSITIVE SARS-CoV-2 target nucleic acids are DETECTED. The SARS-CoV-2 RNA is generally detectable in upper and lower  respiratory specimens dur ing the acute phase of infection.  Positive  results are indicative of active infection with SARS-CoV-2.  Clinical  correlation with patient history and other diagnostic information is  necessary to determine patient infection status.  Positive results do  not rule out bacterial infection or co-infection with other viruses. If result is PRESUMPTIVE POSTIVE SARS-CoV-2 nucleic acids MAY BE PRESENT.   A presumptive positive  result was obtained on the submitted specimen  and confirmed on repeat testing.  While 2019 novel coronavirus  (SARS-CoV-2) nucleic acids may be present in the submitted sample  additional confirmatory testing may be necessary for epidemiological  and / or clinical management purposes  to differentiate between  SARS-CoV-2 and other Sarbecovirus currently known to infect humans.  If clinically indicated additional testing with an alternate test  methodology [id number]) is advised. The SARS-CoV-2 RNA is generally  detectable in upper and lower respiratory sp ecimens during the acute  phase of infection. The expected result is Negative. Fact Sheet for Patients:  BoilerBrush.com.cy Fact Sheet for Healthcare Providers: https://pope.com/ This test is not yet approved or cleared by the [place] FDA and has been authorized for detection and/or diagnosis of SARS-CoV-2 by FDA under an Emergency Use Authorization (EUA).  This EUA will remain in effect (meaning this test can be used) for the duration of the COVID-19 declaration under Section 564(b)(1) of the Act, 21 U.S.C. section 360bbb-3(b)(1), unless the authorization is terminated or revoked sooner. Performed at [facility], [address] [place], [place] [address]   MRSA PCR Screening     Status: None   Collection Time: 12/04/18  8:33 PM  Result Value Ref Range Status   MRSA by PCR NEGATIVE NEGATIVE Final    Comment:        The GeneXpert MRSA Assay (FDA approved for NASAL specimens only), is one component of a comprehensive MRSA colonization surveillance program. It is not intended to diagnose MRSA infection nor to guide or monitor treatment for MRSA infections. Performed at [facility], [address] [place], [place] [address]   Aerobic/Anaerobic Culture (surgical/deep wound)     Status: None (Preliminary result)   Collection Time: 12/05/18  3:36 PM  Result Value  Ref Range Status   Specimen Description FLUID RIGHT LUNG  Final   Special Requests PATIENT ON FOLLOWING ANCEF  Final   Gram Stain   Final    ABUNDANT WBC PRESENT, PREDOMINANTLY PMN FEW GRAM POSITIVE COCCI    Culture   Final    NO GROWTH < 24 HOURS Performed at [facility], [address] [place], [place] [address]    Report Status PENDING  Incomplete  Aerobic/Anaerobic Culture (surgical/deep wound)     Status: None (Preliminary result)   Collection Time: 12/05/18  3:49 PM  Result Value Ref Range Status   Specimen Description TISSUE RIGHT LUNG  Final   Special Requests PATIENT ON FOLLOWING ANCEF  Final   Gram Stain   Final    ABUNDANT WBC PRESENT, PREDOMINANTLY PMN ABUNDANT GRAM POSITIVE COCCI    Culture   Final    NO GROWTH < 24 HOURS Performed at [facility], [address], [place], [place] [address]    Report Status PENDING  Incomplete  Impression/Plan:  1. Empyema - s/p VATS.  MSSA on blood cultures.  Will treat for two more weeks with cefazolin and rifampin from yesterday through June 14th.    2.  Bacteremia - as above, on cefazolin and will complete 2 more weeks.  3.  Access - picc line ordered.

## 2018-12-06 NOTE — Plan of Care (Signed)
  Problem: Education: Goal: Knowledge of General Education information will improve Description Including pain rating scale, medication(s)/side effects and non-pharmacologic comfort measures Outcome: Progressing   

## 2018-12-06 NOTE — Progress Notes (Signed)
Text Paged on call triad MD stating pt resting heart rate has been in 150's since shift change and in 170's when up, BP 138/82, 12 lead EKG showed SVT. Waiting on orders. Will continue to monitor.

## 2018-12-06 NOTE — Progress Notes (Signed)
A-line L wrist discontinued at 3:30 pm w/o difficulty.  Catheter intact.  Pressure applied for 5 mins.  No drainage.

## 2018-12-07 ENCOUNTER — Inpatient Hospital Stay (HOSPITAL_COMMUNITY): Payer: Medicare Other

## 2018-12-07 LAB — CBC
HCT: 23.2 % — ABNORMAL LOW (ref 39.0–52.0)
HCT: 28 % — ABNORMAL LOW (ref 39.0–52.0)
Hemoglobin: 6.8 g/dL — CL (ref 13.0–17.0)
Hemoglobin: 8.7 g/dL — ABNORMAL LOW (ref 13.0–17.0)
MCH: 28.9 pg (ref 26.0–34.0)
MCH: 29.2 pg (ref 26.0–34.0)
MCHC: 29.3 g/dL — ABNORMAL LOW (ref 30.0–36.0)
MCHC: 31.1 g/dL (ref 30.0–36.0)
MCV: 98.7 fL (ref 80.0–100.0)
MCV: 94 fL (ref 80.0–100.0)
Platelets: 246 10*3/uL (ref 150–400)
Platelets: 284 10*3/uL (ref 150–400)
RBC: 2.35 MIL/uL — ABNORMAL LOW (ref 4.22–5.81)
RBC: 2.98 MIL/uL — ABNORMAL LOW (ref 4.22–5.81)
RDW: 17.6 % — ABNORMAL HIGH (ref 11.5–15.5)
RDW: 17.1 % — ABNORMAL HIGH (ref 11.5–15.5)
WBC: 7 10*3/uL (ref 4.0–10.5)
WBC: 6.2 10*3/uL (ref 4.0–10.5)
nRBC: 0 % (ref 0.0–0.2)
nRBC: 0 % (ref 0.0–0.2)

## 2018-12-07 LAB — BASIC METABOLIC PANEL
Anion gap: 8 (ref 5–15)
BUN: 8 mg/dL (ref 8–23)
CO2: 29 mmol/L (ref 22–32)
Calcium: 8.1 mg/dL — ABNORMAL LOW (ref 8.9–10.3)
Chloride: 98 mmol/L (ref 98–111)
Creatinine, Ser: 0.75 mg/dL (ref 0.61–1.24)
GFR calc Af Amer: >60 mL/min (ref >60)
GFR calc non Af Amer: >60 mL/min (ref >60)
Glucose, Bld: 107 mg/dL — ABNORMAL HIGH (ref 70–99)
Potassium: 3.8 mmol/L (ref 3.5–5.1)
Sodium: 135 mmol/L (ref 135–145)

## 2018-12-07 LAB — PREPARE RBC (CROSSMATCH)

## 2018-12-07 LAB — LACTATE DEHYDROGENASE: LDH: 148 U/L (ref 98–192)

## 2018-12-07 LAB — IRON AND TIBC
Iron: 123 ug/dL (ref 45–182)
Saturation Ratios: 74 % — ABNORMAL HIGH (ref 17.9–39.5)
TIBC: 165 ug/dL — ABNORMAL LOW (ref 250–450)
UIBC: 42 ug/dL

## 2018-12-07 LAB — GLUCOSE, CAPILLARY: Glucose-Capillary: 98 mg/dL (ref 70–99)

## 2018-12-07 LAB — RETICULOCYTES
Immature Retic Fract: 22.5 % — ABNORMAL HIGH (ref 2.3–15.9)
RBC.: 2.98 MIL/uL — ABNORMAL LOW (ref 4.22–5.81)
Retic Count, Absolute: 48 10*3/uL (ref 19.0–186.0)
Retic Ct Pct: 1.6 % (ref 0.4–3.1)

## 2018-12-07 LAB — TECHNOLOGIST SMEAR REVIEW

## 2018-12-07 LAB — FERRITIN: Ferritin: 317 ng/mL (ref 24–336)

## 2018-12-07 LAB — VITAMIN B12: Vitamin B-12: 890 pg/mL (ref 180–914)

## 2018-12-07 MED ORDER — SODIUM CHLORIDE 0.9% IV SOLUTION: Freq: Once | INTRAVENOUS | Status: DC

## 2018-12-07 NOTE — Progress Notes (Addendum)
      [address]       [place] [address]             [phone]       2 Days Post-Op Procedure(s) (LRB): RIGHT VIDEO ASSISTED THORACOSCOPY DRAIN EMPYEMA/LUNG ABSCESS (Right) Excisional Debridement Right Sacral Wound (Right)  Subjective: Patient states having to "pee a lot" yesterday and this am.  Objective: Vital signs in last 24 hours: Temp:  [98.1 F (36.7 C)-98.7 F (37.1 C)] 98.7 F (37.1 C) (06/03 0400) Pulse Rate:  [85-162] 85 (06/03 0400) Cardiac Rhythm: Normal sinus rhythm (06/03 0400) Resp:  [12-20] 14 (06/03 0413) BP: (101-141)/(69-82) 125/69 (06/03 0400) SpO2:  [98 %-100 %] 100 % (06/03 0413) Weight:  [58.5 kg] 58.5 kg (06/03 0400)     Intake/Output from previous day: 06/02 0701 - 06/03 0700 In: 957.1 [P.O.:270; I.V.:387.1; IV Piggyback:300] Out: 2680 [Urine:2450; Chest Tube:230]   Physical Exam: General: Awake, alert, oriented Cardiovascular: Slightlyt achycardic Pulmonary: Diminished breath sounds bilaterally Abdomen: Soft, non tender, bowel sounds present. Extremities: No LE edema Wounds: Clean and dry.   Chest Tube: to suction;+1-2 air leak   Lab Results: CBC: Recent Labs    12/06/18 0805 12/07/18 0434  WBC 6.0 7.0  HGB 8.1* 6.8*  HCT 27.6* 23.2*  PLT 273 246   BMET:  Recent Labs    12/06/18 0805 12/07/18 0434  NA 136 135  K 5.0 3.8  CL 102 98  CO2 28 29  GLUCOSE 128* 107*  BUN 8 8  CREATININE 0.74 0.75  CALCIUM 8.2* 8.1*    PT/INR:  Recent Labs    12/05/18 0435  LABPROT 15.1  INR 1.2   ABG:  INR: Will add last result for INR, ABG once components are confirmed Will add last 4 CBG results once components are confirmed  Assessment/Plan:  1. CV - Sinus tachycardia into low 100's at times. 2.  Pulmonary - On 2 liters of oxygen via [place]. Chest tube with 230 cc since surgery. Chest tube is to suction and there is a +1-2 air leak. CXR this am appears stable (pateint is rotated to the right, right basilar  hydropneumothorax).  Encourage incentive spirometer. Gram stain of pleural fluid showed gram positive cocci; culture shows no growth for < 24 hours 3. ID- on Cefazolin 2 grams IV tid and Rifampin 300 mg orally bid 4. Anemia-H and H this am decreased to 6.8 and 23.2. Transfusion on order  Lelon Huh [facility] 12/07/2018,7:07 AM [phone]    Chart reviewed, patient examined, agree with above. CXR looks stable. He has small air leak probably from the necrotic area of RLL where lung abscess ruptured into pleural space. This will close with time. Continue tube to suction.

## 2018-12-07 NOTE — Progress Notes (Signed)
Wasted 3 ml morphine with charge nurse.

## 2018-12-07 NOTE — Progress Notes (Signed)
PHARMACY CONSULT NOTE FOR:  OUTPATIENT  PARENTERAL ANTIBIOTIC THERAPY (OPAT)  Indication: MSSA bacteremia/empyema Regimen: Cefazolin 2 gm Q 8 hours  + Rifampin 300 mg PO BID  End date: 12/18/18   IV antibiotic discharge orders are pended. To discharging provider:  please sign these orders via discharge navigator,  Select New Orders & click on the button choice - Manage This Unsigned Work.     Thank you for allowing pharmacy to be a part of this patient's care.  Sharin Mons, PharmD, BCPS, BCIDP Infectious Diseases Clinical Pharmacist Phone: [phone] 12/07/2018, 9:57 AM

## 2018-12-07 NOTE — Progress Notes (Signed)
CRITICAL VALUE ALERT  Critical Value: hbg 6.8  Date & Time Notied:  Text paged on call triad 12/07/2018, [phone]  Provider Notified: Donnamarie Poag   Orders Received/Actions taken: awaiting orders.

## 2018-12-07 NOTE — Progress Notes (Signed)
PROGRESS NOTE    Randy Page   TXH:[id number]  DOB: Jan 08, 1956  DOA: 12/02/2018 PCP: Karle Plumber, MD   Brief Narrative:  Randy Page 63 y.o. male with medical history significant for hypertension, hyperlipidemia, recent GI bleed due to duodenal ulcer, anemia, chronic neck pain status post cervical fusion who is transferred to [facility] hospital from [facility] for further management of right-sided empyema and MSSA bacteremia. He presented to [name] after a fall at home. He was found to be hypoglycemia and hypotensive (SBP 80s) by EMS.  Lactic acid was 12.3 CT chest > large air and fluid collection in right hemithorax favored to represent a large empyema, complete collapse of RML and near compete collapse of RLL. Tree in bud opacities in RUL and ELL. Emphysematous changes.   He was previously admitted to [facility] from 10/31/18- 11/03/18 for GI bleed found to be due to a duodenal ulcer.    Subjective: Continues to have chest pain.  No nausea no vomiting no fever no chills.  No  abdominal pain.  Continues to have shortness of breath.    Assessment & Plan:   Principal Problem:   MSSA bacteremia, HCAP,  empyema, acute respiratory failure - underwent chest tube on 5/27 - plan at [name] for 3 wks of Cefazolin and Rifampin - I have asked for an ID consult as well  -   repeat blood cultures negative  - repeat pleural fluid cultures pending - CXR 5/30> no significant change in residual pleural and parenchymal disease at the right base - transferred to [facility] for CT surgery to evaluate - 6/1- underwent uncompliacated VATS by Dr Laneta Simmers - wean O2 as able - ID recommend PICC and 2 wks of Cefazolin and Rifampin after VATS- Have PICC  Active Problems:   Hypotension with h/o Essential hypertension - holding antihypertensives - 5/31>  PT note mentions orthostatic hypotension BP drop to 90/65 from 132/61 and he became lightheaded after 45 sec.  - despite fluid  replacement, orthostatics continue to be + - TEDS ordered by he declined them- I have explained the rational for orthostatic hypotension and he is in agreement - Cortisol level 9.7 is a little low for his level of illness- -  Cosyntropin test ordered and administered today but ACTH not drawn by lab - recheck orthostatics    Decubitus ulcer b/l but much larger on right buttock- present on admission - followed by wound care clinic as outpt    - consulted general surgery - underwent debridement after VATS in OR - added air mattress  Hypoalbuminemia, underweight Body mass index is 19.05 kg/m. - in context of current illness - have added Ensure and requested an nutrition eval- recommendations> TID Ensure  Elevated INR -  cont vit K x 3 days  - INR is noted to be improved    Hyperlipidemia - cont Atorvastatin  Hypokalemia -replaced      Chronic pain in neck and back - s/p cervical fusion - cont Oxy IR 15 mg Q4 hrs PRN and Gabapentin    Duodenal ulcer with GI bleed s/p clipping 4/20 - cont Protonix  Anemia- normocytic -  Hb 7.4 -8.5 range -  no prior Hb to compare with -  anemia panel consistent with AOCD  Nicotine abuse - counseled to quit- he states he would like to but his girlfriend also smokes - cont Nicotine patch  Hyperthyroid -cont Tapazol  Deconditioning/ fall on admission - PT eval ordered- recommending HHPT  Time  spent in minutes: 35 min DVT prophylaxis: SCDs Code Status: Full code Family Communication:  Disposition Plan: HHPT recommended Consultants:   CT surgery  General surgery Procedures:   Chest tube  VATS  I and D of decubitus Antimicrobials:  Anti-infectives (From admission, onward)   Start     Dose/Rate Route Frequency Ordered Stop   12/02/18 2330  ceFAZolin (ANCEF) IVPB 2g/100 mL premix     2 g 200 mL/hr over 30 Minutes Intravenous Every 8 hours 12/02/18 2322     12/02/18 2330  rifampin (RIFADIN) capsule 300 mg     300 mg Oral  Every 12 hours 12/02/18 2322         Objective: Vitals:   12/07/18 1200 12/07/18 1340 12/07/18 1503 12/07/18 1840  BP: 140/70  (!) 165/79   Pulse: 100  88   Resp: Temp:   98.2 F (36.8 C)   TempSrc:   Oral   SpO2: 99% 99% 100% 99%  Weight:      Height:        Intake/Output Summary (Last 24 hours) at 12/07/2018 1933 Last data filed at 12/07/2018 1922 Gross per 24 hour  Intake 364.56 ml  Output 2800 ml  Net -2435.44 ml   Filed Weights   12/05/18 0639 12/06/18 0500 12/07/18 0400  Weight: 55.1 kg 59.7 kg 58.5 kg    Examination:  General exam: Appears comfortable  HEENT: PERRLA, oral mucosa moist, no sclera icterus or thrush Respiratory system: Crackles in RLL, bloody fluid in chest tube- Respiratory effort normal. Cardiovascular system: S1 & S2 heard,  No murmurs - tachycardic  Gastrointestinal system: Abdomen soft, non-tender, nondistended. Normal bowel sounds   Central nervous system: Alert and oriented. No focal neurological deficits. Extremities: No cyanosis, clubbing or edema Skin: dressing just changed by surgery and thus I did no re-open it today Psychiatry:  Mood & affect appropriate.  Psychiatry:  Mood & affect appropriate.   Data Reviewed: I have personally reviewed following labs and imaging studies  CBC: Recent Labs  Lab 12/02/18 2341  12/04/18 1054 12/05/18 0435 12/06/18 0805 12/07/18 0434 12/07/18 1630  WBC 4.2   < > 4.1 5.8 6.0 7.0 6.2  NEUTROABS 2.6  --   --   --   --   --   --   HGB 7.7*   < > 8.5* 8.5* 8.1* 6.8* 8.7*  HCT 25.6*   < > 28.3* 28.0* 27.6* 23.2* 28.0*  MCV 95.9   < > 96.3 95.2 97.2 98.7 94.0  PLT 177   < > 239 248 273 246 284   < > = values in this interval not displayed.   Basic Metabolic Panel: Recent Labs  Lab 12/02/18 2341 12/03/18 0217 12/05/18 0435 12/06/18 0805 12/07/18 0434  NA 134* 136 131* 136 135  K 3.4* 3.4* 4.3 5.0 3.8  CL 103 102 99 102 98  CO2 GLUCOSE 103* 93 81 128* 107*  BUN  9 8 6* 8 8  CREATININE 0.60* 0.60* 0.57* 0.74 0.75  CALCIUM 7.2* 7.4* 8.3* 8.2* 8.1*   GFR: Estimated Creatinine Clearance: 78.2 mL/min (by C-G formula based on SCr of 0.75 mg/dL). Liver Function Tests: Recent Labs  Lab 12/02/18 2341 12/03/18 0217  AST 16 13*  ALT 7 8  ALKPHOS 99 95  BILITOT 0.7 0.7  PROT 5.5* 5.3*  ALBUMIN 1.3* 1.3*   No results for input(s): LIPASE, AMYLASE in the last 168 hours.  No results for input(s): AMMONIA in the last 168 hours. Coagulation Profile: Recent Labs  Lab 12/03/18 0217 12/04/18 0342 12/05/18 0435  INR 1.5* 1.4* 1.2   Cardiac Enzymes: No results for input(s): CKTOTAL, CKMB, CKMBINDEX, TROPONINI in the last 168 hours. BNP (last 3 results) No results for input(s): PROBNP in the last 8760 hours. HbA1C: No results for input(s): HGBA1C in the last 72 hours. CBG: Recent Labs  Lab 12/05/18 2152 12/06/18 0638 12/06/18 1107 12/06/18 1648 12/07/18 1234  GLUCAP 184* 130* 217* 107* 98   Lipid Profile: No results for input(s): CHOL, HDL, LDLCALC, TRIG, CHOLHDL, LDLDIRECT in the last 72 hours. Thyroid Function Tests: Recent Labs    12/06/18 0805  FREET4 0.93   Anemia Panel: Recent Labs    12/07/18 1630  VITAMINB12 890  FERRITIN 317  TIBC 165*  IRON 123  RETICCTPCT 1.6   Urine analysis: No results found for: COLORURINE, APPEARANCEUR, LABSPEC, PHURINE, GLUCOSEU, HGBUR, BILIRUBINUR, KETONESUR, PROTEINUR, UROBILINOGEN, NITRITE, LEUKOCYTESUR Sepsis Labs: (procalcitonin:4,lacticidven:4) ) Recent Results (from the past 240 hour(s))  Culture, blood (Routine X 2) w Reflex to ID Panel     Status: None (Preliminary result)   Collection Time: 12/02/18 11:41 PM  Result Value Ref Range Status   Specimen Description BLOOD RIGHT HAND  Final   Special Requests   Final    BOTTLES DRAWN AEROBIC AND ANAEROBIC Blood Culture adequate volume   Culture   Final    NO GROWTH 4 DAYS Performed at [facility], [address]  [place], [place] [address]    Report Status PENDING  Incomplete  Culture, blood (Routine X 2) w Reflex to ID Panel     Status: None (Preliminary result)   Collection Time: 12/02/18 11:41 PM  Result Value Ref Range Status   Specimen Description BLOOD RIGHT HAND  Final   Special Requests   Final    BOTTLES DRAWN AEROBIC AND ANAEROBIC Blood Culture results may not be optimal due to an excessive volume of blood received in culture bottles   Culture   Final    NO GROWTH 4 DAYS Performed at [facility], [address] [place], [place] [address]    Report Status PENDING  Incomplete  SARS Coronavirus 2 (CEPHEID - Performed in [facility] hospital lab), Hosp Order     Status: None   Collection Time: 12/03/18  1:40 AM  Result Value Ref Range Status   SARS Coronavirus 2 NEGATIVE NEGATIVE Final    Comment: (NOTE) If result is NEGATIVE SARS-CoV-2 target nucleic acids are NOT DETECTED. The SARS-CoV-2 RNA is generally detectable in upper and lower  respiratory specimens during the acute phase of infection. The lowest  concentration of SARS-CoV-2 viral copies this assay can detect is 250  copies / mL. A negative result does not preclude SARS-CoV-2 infection  and should not be used as the sole basis for treatment or other  patient management decisions.  A negative result may occur with  improper specimen collection / handling, submission of specimen other  than nasopharyngeal swab, presence of viral mutation(s) within the  areas targeted by this assay, and inadequate number of viral copies  (<250 copies / mL). A negative result must be combined with clinical  observations, patient history, and epidemiological information. If result is POSITIVE SARS-CoV-2 target nucleic acids are DETECTED. The SARS-CoV-2 RNA is generally detectable in upper and lower  respiratory specimens dur ing the acute phase of infection.  Positive  results are indicative of  active infection with SARS-CoV-2.  Clinical   correlation with patient history and other diagnostic information is  necessary to determine patient infection status.  Positive results do  not rule out bacterial infection or co-infection with other viruses. If result is PRESUMPTIVE POSTIVE SARS-CoV-2 nucleic acids MAY BE PRESENT.   A presumptive positive result was obtained on the submitted specimen  and confirmed on repeat testing.  While 2019 novel coronavirus  (SARS-CoV-2) nucleic acids may be present in the submitted sample  additional confirmatory testing may be necessary for epidemiological  and / or clinical management purposes  to differentiate between  SARS-CoV-2 and other Sarbecovirus currently known to infect humans.  If clinically indicated additional testing with an alternate test  methodology [id number]) is advised. The SARS-CoV-2 RNA is generally  detectable in upper and lower respiratory sp ecimens during the acute  phase of infection. The expected result is Negative. Fact Sheet for Patients:  BoilerBrush.com.cyhttps://www.fda.gov/media/136312/download Fact Sheet for Healthcare Providers: https://pope.com/https://www.fda.gov/media/136313/download This test is not yet approved or cleared by the [place] FDA and has been authorized for detection and/or diagnosis of SARS-CoV-2 by FDA under an Emergency Use Authorization (EUA).  This EUA will remain in effect (meaning this test can be used) for the duration of the COVID-19 declaration under Section 564(b)(1) of the Act, 21 U.S.C. section 360bbb-3(b)(1), unless the authorization is terminated or revoked sooner. Performed at [facility], [address] [place], [place] [address]   MRSA PCR Screening     Status: None   Collection Time: 12/04/18  8:33 PM  Result Value Ref Range Status   MRSA by PCR NEGATIVE NEGATIVE Final    Comment:        The GeneXpert MRSA Assay (FDA approved for NASAL specimens only), is one component of a comprehensive MRSA colonization surveillance program. It is not  intended to diagnose MRSA infection nor to guide or monitor treatment for MRSA infections. Performed at [facility], [address] [place], [place] [address]   Aerobic/Anaerobic Culture (surgical/deep wound)     Status: None (Preliminary result)   Collection Time: 12/05/18  3:36 PM  Result Value Ref Range Status   Specimen Description FLUID RIGHT LUNG  Final   Special Requests PATIENT ON FOLLOWING ANCEF  Final   Gram Stain   Final    ABUNDANT WBC PRESENT, PREDOMINANTLY PMN FEW GRAM POSITIVE COCCI Performed at [facility], [address] [place], [place] [address]    Culture   Final    CULTURE REINCUBATED FOR BETTER GROWTH NO ANAEROBES ISOLATED; CULTURE IN PROGRESS FOR 5 DAYS    Report Status PENDING  Incomplete  Aerobic/Anaerobic Culture (surgical/deep wound)     Status: None (Preliminary result)   Collection Time: 12/05/18  3:49 PM  Result Value Ref Range Status   Specimen Description TISSUE RIGHT LUNG  Final   Special Requests PATIENT ON FOLLOWING ANCEF  Final   Gram Stain   Final    ABUNDANT WBC PRESENT, PREDOMINANTLY PMN ABUNDANT GRAM POSITIVE COCCI Performed at [facility], [address] [place], [place] [address]    Culture   Final    RARE STAPHYLOCOCCUS AUREUS CULTURE REINCUBATED FOR BETTER GROWTH SUSCEPTIBILITIES TO FOLLOW NO ANAEROBES ISOLATED; CULTURE IN PROGRESS FOR 5 DAYS    Report Status PENDING  Incomplete  Acid Fast Smear (AFB)     Status: None   Collection Time: 12/05/18  3:49 PM  Result Value Ref Range Status   AFB Specimen  Processing Comment  Final    Comment: Tissue Grinding and Digestion/Decontamination   Acid Fast Smear Negative  Final    Comment: (NOTE) Performed At: [facility] [address] [place], [place] [id number] Jolene Schimke MD GM:[id number]    Source (AFB) TISSUE  Final    Comment: RIGHT LUNG Performed at [facility], [address], [place], [place] [address]          Radiology  Studies: Dg Chest Port 1 View  Result Date: 12/07/2018 CLINICAL DATA:  Follow-up pleural effusion EXAM: PORTABLE CHEST 1 VIEW COMPARISON:  12/06/2010 FINDINGS: Coil chest tube is noted in the right base. Small right basilar pneumothorax is noted. Right-sided PICC line is noted in satisfactory position. The lungs again demonstrates some atelectatic changes related to the basilar pneumothorax on the right. No other focal abnormality is noted. IMPRESSION: Stable right basilar pneumothorax with associated atelectasis. Electronically Signed   By: Alcide Clever M.D.   On: 12/07/2018 08:59   Dg Chest Port 1 View  Result Date: 12/06/2018 CLINICAL DATA:  Lung infection, empyema, pain on right side EXAM: PORTABLE CHEST 1 VIEW COMPARISON:  12/05/2018 FINDINGS: Right basilar chest tube remains in place. Loculated right basilar hydropneumothorax, stable since prior study. Continued airspace opacity in the right lower lobe. Left lung clear. Heart is normal size. IMPRESSION: Right basilar chest tube remains in place with stable loculated right basilar hydropneumothorax. Right lower lobe airspace opacity unchanged. Electronically Signed   By: Charlett Nose M.D.   On: 12/06/2018 08:12   [place] Ekg Site Rite  Result Date: 12/06/2018 If Site Rite image not attached, placement could not be confirmed due to current cardiac rhythm.     Scheduled Meds: . sodium chloride   Intravenous Once  . atorvastatin  20 mg Oral QHS  . bisacodyl  10 mg Oral Daily  . doxepin  100 mg Oral QHS  . feeding supplement (ENSURE ENLIVE)  237 mL Oral TID BM  . gabapentin  600 mg Oral QID  . morphine   Intravenous Q4H  . multivitamin with minerals  1 tablet Oral Daily  . nicotine  14 mg Transdermal Daily  . pantoprazole  40 mg Oral Daily  . rifampin  300 mg Oral Q12H  . senna-docusate  1 tablet Oral QHS  . sodium chloride flush  10-40 mL Intracatheter Q12H  . tiZANidine  4 mg Oral QHS   Continuous Infusions: .  ceFAZolin (ANCEF) IV 2 g  (12/07/18 1551)  . dextrose 5% lactated ringers 10 mL/hr at 12/06/18 0805     LOS: 5 days      Lynden Oxford, MD [facility] Pager: [email or web address] Password [facility] 12/07/2018, 7:33 PM

## 2018-12-07 NOTE — Progress Notes (Signed)
[facility] Progress Note  2 Days Post-Op   Assessment/Plan Principal Problem: MSSA bacteremia Active Problems: Essential hypertension Cigarette smoker Hyperlipidemia Empyema of right pleural space (HCC) Chronic pain Duodenal ulcer Decubitus ulcer Elevated INR  R Empyema - S/P VATS, Dr. Laneta SimmersBartle, 06/01  L buttock/sacral decub - S/P excisional debridement of right sacral wound, Dr. Andrey CampanileWilson, 06/01 - wet to dry dressing changes qshift, pink dressing to other areas - antibiotics per ID  Follow up: [facility] clinic 2-3 weeks for wound check   LOS: 5 days    Subjective: CC: no complaints  Pt did not sleep well last night.   Objective: Vital signs in last 24 hours: Temp:  [98.1 F (36.7 C)-98.7 F (37.1 C)] 98.7 F (37.1 C) (06/03 0400) Pulse Rate:  [85-162] 85 (06/03 0400) Resp:  [12-20] 14 (06/03 0413) BP: (101-141)/(69-82) 125/69 (06/03 0400) SpO2:  [98 %-100 %] 100 % (06/03 0413) Weight:  [58.5 kg] 58.5 kg (06/03 0400) Last BM Date: 12/04/18  Intake/Output from previous day: 06/02 0701 - 06/03 0700 In: 957.1 [P.O.:270; I.V.:387.1; IV Piggyback:300] Out: 2680 [Urine:2450; Chest Tube:230] Intake/Output this shift: No intake/output data recorded.  PE: Gen:  Alert, NAD, pleasant, cooperative Pulm:  Rate and effort normal GU: sacral wound looks clean and is without purulent drainage, no TTP, see photo below Skin: no rashes noted, warm and dry      Anti-infectives: Anti-infectives (From admission, onward)   Start     Dose/Rate Route Frequency Ordered Stop   12/02/18 2330  ceFAZolin (ANCEF) IVPB 2g/100 mL premix     2 g 200 mL/hr over 30 Minutes Intravenous Every 8 hours 12/02/18 2322     12/02/18 2330  rifampin (RIFADIN) capsule 300 mg     300 mg Oral Every 12 hours 12/02/18 2322        Lab Results:  Recent Labs    12/06/18 0805 12/07/18 0434  WBC 6.0 7.0  HGB 8.1* 6.8*  HCT 27.6* 23.2*  PLT 273 246   BMET  Recent Labs    12/06/18 0805 12/07/18 0434  NA 136 135  K 5.0 3.8  CL 102 98  CO2 28 29  GLUCOSE 128* 107*  BUN 8 8  CREATININE 0.74 0.75  CALCIUM 8.2* 8.1*   PT/INR Recent Labs    12/05/18 0435  LABPROT 15.1  INR 1.2   CMP     Component Value Date/Time   NA 135 12/07/2018 0434   NA 139 09/15/2017 1040   K 3.8 12/07/2018 0434   CL 98 12/07/2018 0434   CO2 29 12/07/2018 0434   GLUCOSE 107 (H) 12/07/2018 0434   BUN 8 12/07/2018 0434   BUN 5 (L) 09/15/2017 1040   CREATININE 0.75 12/07/2018 0434   CALCIUM 8.1 (L) 12/07/2018 0434   PROT 5.3 (L) 12/03/2018 0217   ALBUMIN 1.3 (L) 12/03/2018 0217   AST 13 (L) 12/03/2018 0217   ALT 8 12/03/2018 0217   ALKPHOS 95 12/03/2018 0217   BILITOT 0.7 12/03/2018 0217   GFRNONAA >60 12/07/2018 0434   GFRAA >60 12/07/2018 0434   Lipase  No results found for: LIPASE  Studies/Results: Dg Chest Port 1 View  Result Date: 12/06/2018 CLINICAL DATA:  Lung infection, empyema, pain on right side EXAM: PORTABLE CHEST 1 VIEW COMPARISON:  12/05/2018 FINDINGS: Right basilar chest tube remains in place. Loculated right basilar hydropneumothorax, stable since prior study. Continued airspace opacity in the right lower lobe. Left lung clear. Heart is normal size. IMPRESSION: Right basilar  chest tube remains in place with stable loculated right basilar hydropneumothorax. Right lower lobe airspace opacity unchanged. Electronically Signed   By: Charlett Nose M.D.   On: 12/06/2018 08:12   Dg Chest Port 1 View  Result Date: 12/05/2018 CLINICAL DATA:  Empyema. EXAM: PORTABLE CHEST 1 VIEW COMPARISON:  Dec 03, 2018 FINDINGS: A right chest tube is seen. Opacity remains in the right base, improved. There appears to be a prominent skin fold over the right apex with lung markings on both sides. No convincing evidence of pneumothorax. The left lung is clear. No other change. IMPRESSION: 1. There is a right chest tube which appears to be unchanged since the previous  study. Persistent opacity remains in the right base. There is a skin fold over the right apex but no definitive evidence of pneumothorax. Recommend attention to this region on follow-up. Electronically Signed   By: Gerome Sam III M.D   On: 12/05/2018 19:21   [place] Ekg Site Rite  Result Date: 12/06/2018 If Site Rite image not attached, placement could not be confirmed due to current cardiac rhythm.     Jerre Simon , [facility] 12/07/2018, 8:17 AM  Pager: [phone] Mon-Wed, Friday 7:00am-4:30pm Thurs 7am-11:30am  Consults: [phone]

## 2018-12-07 NOTE — Progress Notes (Signed)
[facility]  Date of Admission:  12/02/2018     Total days of antibiotics 6         ASSESSMENT/PLAN  Mr. Glazebrook has a sacral decubitus ulcer s/p incision and debridement and a right-sided empyema complicated by MSSA bacteremia status post VATS. Repeat blood cultures without growth to date and surgical specimens with gram positive cocci on gram stain and no growth from cultures. TEE done at [facility] with no evidence of vegetation/endocarditis. Currently on Cefazolin and rifampin.  Empyema - Continues to have chest tube for now with about 190/cc drainage. Gram stains with gram positive cocci and cultures without growth most likely reflecting MSSA. Source control achieved with VATS. Plan to continue Cefazolin and rifampin for 2 weeks through 12/18/18. Wound and chest tube management per CVTS.   MSSA Bacteremia - Repeat cultures without growth. . Continue Cefazolin and rifampin.   Therapeutic Drug Monitoring - CBC without evidence of thrombocytopenia. Will continue to monitor while on rifampin.   Sacral decubitus ulcer - Currently with wet to dry dressing q shift. Wound care and follow up per general surgery. Encouraged continue off loading of site and optimizing of nutrition.   Diagnosis: Empyema / MSSA Bacteremia / Sacral decubitus ulcer  Culture Result: MSSA  Allergies  Allergen Reactions  . Aspirin Other (See Comments)    Patient states he cannot take ASA due to a stomach ulcer.    OPAT Orders Discharge antibiotics: Cefazolin and rifampin Per pharmacy protocol Duration: 2 weeks  End Date: 12/18/2018  [facility] Care Per Protocol:  Labs weekly while on IV antibiotics: _X_ CBC with differential __ BMP _X_ CMP __ CRP __ ESR __ Vancomycin trough __ CK  _X_ Please pull PIC at completion of IV antibiotics __ Please leave PIC in place until doctor has seen patient or been notified  Fax weekly labs to [phone]  Clinic Follow Up Appt: 2 weeks  for office visit or e-visit 12/19/2018 @ 9:30 AM   Principal Problem:   MSSA bacteremia Active Problems:   Essential hypertension   Cigarette smoker   Hyperlipidemia   Empyema of right pleural space (HCC)   Chronic pain   Duodenal ulcer   Decubitus ulcer   Elevated INR   Empyema ([place])   . sodium chloride   Intravenous Once  . atorvastatin  20 mg Oral QHS  . bisacodyl  10 mg Oral Daily  . doxepin  100 mg Oral QHS  . feeding supplement (ENSURE ENLIVE)  237 mL Oral TID BM  . gabapentin  600 mg Oral QID  . morphine   Intravenous Q4H  . multivitamin with minerals  1 tablet Oral Daily  . nicotine  14 mg Transdermal Daily  . pantoprazole  40 mg Oral Daily  . rifampin  300 mg Oral Q12H  . senna-docusate  1 tablet Oral QHS  . sodium chloride flush  10-40 mL Intracatheter Q12H  . tiZANidine  4 mg Oral QHS    SUBJECTIVE:  Afebrile overnight with no acute events.  Repeat blood cultures on 5/29 with no growth to date.  Surgical specimens from VATS on 6/1 with no growth on culture and Gram stain with gram-positive cocci.  Feeling okay today. No shortness of breath or fevers.   Allergies  Allergen Reactions  . Aspirin Other (See Comments)    Patient states he cannot take ASA due to a stomach ulcer.     Review of Systems: Review of Systems  Constitutional: Negative  for chills, fever and weight loss.  Respiratory: Negative for cough, shortness of breath and wheezing.   Cardiovascular: Negative for chest pain and leg swelling.  Gastrointestinal: Negative for abdominal pain, constipation, diarrhea, nausea and vomiting.  Skin: Negative for rash.      OBJECTIVE: Vitals:   12/06/18 2318 12/07/18 0000 12/07/18 0400 12/07/18 0413  BP: 101/70 115/79 125/69   Pulse: (!) 138 95 85   Resp: _0 Temp:  98.1 F (36.7 C) 98.7 F (37.1 C)   TempSrc:  Oral Oral   SpO2: 99% 100% 99% 100%  Weight:   58.5 kg   Height:       Body mass index is 19.05 kg/m.  Physical Exam  Constitutional:      General: He is not in acute distress.    Appearance: He is well-developed.     Comments: Lying in bed with head of bed elevated; pleasant.  Cardiovascular:     Rate and Rhythm: Regular rhythm. Tachycardia present.     Heart sounds: Normal heart sounds.     Comments: PICC line in right upper arm with clean and dry dressing and no evidence of infection.  Pulmonary:     Effort: Pulmonary effort is normal.     Comments: Chest tube with reddish/pink drainage. Patent. Diminished breath sounds bilaterally.  Skin:    General: Skin is warm and dry.  Neurological:     Mental Status: He is alert and oriented to person, place, and time.  Psychiatric:        Behavior: Behavior normal.        Thought Content: Thought content normal.        Judgment: Judgment normal.     Lab Results Lab Results  Component Value Date   WBC 7.0 12/07/2018   HGB 6.8 (LL) 12/07/2018   HCT 23.2 (L) 12/07/2018   MCV 98.7 12/07/2018   PLT 246 12/07/2018    Lab Results  Component Value Date   CREATININE 0.75 12/07/2018   BUN 8 12/07/2018   NA 135 12/07/2018   K 3.8 12/07/2018   CL 98 12/07/2018   CO2 29 12/07/2018    Lab Results  Component Value Date   ALT 8 12/03/2018   AST 13 (L) 12/03/2018   ALKPHOS 95 12/03/2018   BILITOT 0.7 12/03/2018     Microbiology: Recent Results (from the past 240 hour(s))  Culture, blood (Routine X 2) w Reflex to ID Panel     Status: None (Preliminary result)   Collection Time: 12/02/18 11:41 PM  Result Value Ref Range Status   Specimen Description BLOOD RIGHT HAND  Final   Special Requests   Final    BOTTLES DRAWN AEROBIC AND ANAEROBIC Blood Culture adequate volume   Culture   Final    NO GROWTH 4 DAYS Performed at [facility], [address], [place], [place] [address]    Report Status PENDING  Incomplete  Culture, blood (Routine X 2) w Reflex to ID Panel     Status: None (Preliminary result)   Collection Time: 12/02/18 11:41 PM   Result Value Ref Range Status   Specimen Description BLOOD RIGHT HAND  Final   Special Requests   Final    BOTTLES DRAWN AEROBIC AND ANAEROBIC Blood Culture results may not be optimal due to an excessive volume of blood received in culture bottles   Culture   Final    NO GROWTH 4 DAYS Performed at [facility], [address] [place], [place] [address]    Report Status PENDING  Incomplete  SARS Coronavirus 2 (CEPHEID - Performed in [place] hospital lab), Hosp Order     Status: None   Collection Time: 12/03/18  1:40 AM  Result Value Ref Range Status   SARS Coronavirus 2 NEGATIVE NEGATIVE Final    Comment: (NOTE) If result is NEGATIVE SARS-CoV-2 target nucleic acids are NOT DETECTED. The SARS-CoV-2 RNA is generally detectable in upper and lower  respiratory specimens during the acute phase of infection. The lowest  concentration of SARS-CoV-2 viral copies this assay can detect is 250  copies / mL. A negative result does not preclude SARS-CoV-2 infection  and should not be used as the sole basis for treatment or other  patient management decisions.  A negative result may occur with  improper specimen collection / handling, submission of specimen other  than nasopharyngeal swab, presence of viral mutation(s) within the  areas targeted by this assay, and inadequate number of viral copies  (<250 copies / mL). A negative result must be combined with clinical  observations, patient history, and epidemiological information. If result is POSITIVE SARS-CoV-2 target nucleic acids are DETECTED. The SARS-CoV-2 RNA is generally detectable in upper and lower  respiratory specimens dur ing the acute phase of infection.  Positive  results are indicative of active infection with SARS-CoV-2.  Clinical  correlation with patient history and other diagnostic information is  necessary to determine patient infection status.  Positive results do  not rule out bacterial infection or co-infection  with other viruses. If result is PRESUMPTIVE POSTIVE SARS-CoV-2 nucleic acids MAY BE PRESENT.   A presumptive positive result was obtained on the submitted specimen  and confirmed on repeat testing.  While 2019 novel coronavirus  (SARS-CoV-2) nucleic acids may be present in the submitted sample  additional confirmatory testing may be necessary for epidemiological  and / or clinical management purposes  to differentiate between  SARS-CoV-2 and other Sarbecovirus currently known to infect humans.  If clinically indicated additional testing with an alternate test  methodology [phone]) is advised. The SARS-CoV-2 RNA is generally  detectable in upper and lower respiratory sp ecimens during the acute  phase of infection. The expected result is Negative. Fact Sheet for Patients:  StrictlyIdeas.no Fact Sheet for Healthcare Providers: BankingDealers.co.za This test is not yet approved or cleared by the [place] FDA and has been authorized for detection and/or diagnosis of SARS-CoV-2 by FDA under an Emergency Use Authorization (EUA).  This EUA will remain in effect (meaning this test can be used) for the duration of the COVID-19 declaration under Section 564(b)(1) of the Act, 21 U.S.C. section 360bbb-3(b)(1), unless the authorization is terminated or revoked sooner. Performed at [facility], [place] [address], [place], [place] [address]   MRSA PCR Screening     Status: None   Collection Time: 12/04/18  8:33 PM  Result Value Ref Range Status   MRSA by PCR NEGATIVE NEGATIVE Final    Comment:        The GeneXpert MRSA Assay (FDA approved for NASAL specimens only), is one component of a comprehensive MRSA colonization surveillance program. It is not intended to diagnose MRSA infection nor to guide or monitor treatment for MRSA infections. Performed at [facility], [place] [address] [place], [place] [address]    Aerobic/Anaerobic Culture (surgical/deep wound)     Status: None (Preliminary result)   Collection Time: 12/05/18  3:36 PM  Result Value Ref Range Status   Specimen Description FLUID RIGHT LUNG  Final   Special Requests PATIENT ON FOLLOWING ANCEF  Final   Gram Stain   Final    ABUNDANT WBC PRESENT, PREDOMINANTLY PMN FEW GRAM POSITIVE COCCI    Culture   Final    NO GROWTH < 24 HOURS Performed at [facility], [place] [address], [place], [place] [address]    Report Status PENDING  Incomplete  Aerobic/Anaerobic Culture (surgical/deep wound)     Status: None (Preliminary result)   Collection Time: 12/05/18  3:49 PM  Result Value Ref Range Status   Specimen Description TISSUE RIGHT LUNG  Final   Special Requests PATIENT ON FOLLOWING ANCEF  Final   Gram Stain   Final    ABUNDANT WBC PRESENT, PREDOMINANTLY PMN ABUNDANT GRAM POSITIVE COCCI    Culture   Final    NO GROWTH < 24 HOURS Performed at [facility], [place] [address], [place], [place] [address]    Report Status PENDING  Incomplete  Acid Fast Smear (AFB)     Status: None   Collection Time: 12/05/18  3:49 PM  Result Value Ref Range Status   AFB Specimen Processing Comment  Final    Comment: Tissue Grinding and Digestion/Decontamination   Acid Fast Smear Negative  Final    Comment: (NOTE) Performed At: [facility] [place], [place] [id number] Rush Farmer MD [id number]    Source (AFB) TISSUE  Final    Comment: RIGHT LUNG Performed at [facility], [place] [address] [place], [place] [address]      Terri Piedra, [place] for [place] Pager  12/07/2018  9:47 AM

## 2018-12-07 NOTE — Care Management Important Message (Signed)
Important Message  Patient Details  Name: Randy Page MRN: [id number] Date of Birth: Aug 13, 1955   Medicare Important Message Given:  Yes    Dorena Bodo 12/07/2018, 1:43 PM

## 2018-12-08 ENCOUNTER — Inpatient Hospital Stay (HOSPITAL_COMMUNITY): Payer: Medicare Other

## 2018-12-08 LAB — TYPE AND SCREEN
ABO/RH(D): O POS
Antibody Screen: NEGATIVE
Unit division: 0
Unit division: 0
Unit division: 0

## 2018-12-08 LAB — BPAM RBC
Blood Product Expiration Date: 202006232359
Blood Product Expiration Date: 202006232359
Blood Product Expiration Date: 202006242359
ISSUE DATE / TIME: 202006022130
ISSUE DATE / TIME: 202006031054
ISSUE DATE / TIME: 202006040946
Unit Type and Rh: 5100
Unit Type and Rh: 5100
Unit Type and Rh: 5100

## 2018-12-08 LAB — CULTURE, BLOOD (ROUTINE X 2)
Culture: NO GROWTH
Culture: NO GROWTH
Special Requests: ADEQUATE

## 2018-12-08 LAB — COMPREHENSIVE METABOLIC PANEL
ALT: 6 U/L (ref 0–44)
AST: 16 U/L (ref 15–41)
Albumin: 1.7 g/dL — ABNORMAL LOW (ref 3.5–5.0)
Alkaline Phosphatase: 100 U/L (ref 38–126)
Anion gap: 8 (ref 5–15)
BUN: <5 mg/dL — ABNORMAL LOW (ref 8–23)
CO2: 32 mmol/L (ref 22–32)
Calcium: 8.5 mg/dL — ABNORMAL LOW (ref 8.9–10.3)
Chloride: 96 mmol/L — ABNORMAL LOW (ref 98–111)
Creatinine, Ser: 0.59 mg/dL — ABNORMAL LOW (ref 0.61–1.24)
GFR calc Af Amer: >60 mL/min (ref >60)
GFR calc non Af Amer: >60 mL/min (ref >60)
Glucose, Bld: 131 mg/dL — ABNORMAL HIGH (ref 70–99)
Potassium: 3.4 mmol/L — ABNORMAL LOW (ref 3.5–5.1)
Sodium: 136 mmol/L (ref 135–145)
Total Bilirubin: 0.6 mg/dL (ref 0.3–1.2)
Total Protein: 6.2 g/dL — ABNORMAL LOW (ref 6.5–8.1)

## 2018-12-08 LAB — HAPTOGLOBIN: Haptoglobin: 142 mg/dL (ref 32–363)

## 2018-12-08 MED ORDER — POTASSIUM CHLORIDE CRYS ER 20 MEQ PO TBCR
40.0000 meq | EXTENDED_RELEASE_TABLET | Freq: Once | ORAL | Status: AC
  Administered 2018-12-08: 10:00:00 40 meq via ORAL
  Filled 2018-12-08: qty 2

## 2018-12-08 MED ORDER — PRO-STAT SUGAR FREE PO LIQD
30.0000 mL | Freq: Two times a day (BID) | ORAL | Status: DC
  Administered 2018-12-08 – 2018-12-13 (×4): 30 mL via ORAL
  Filled 2018-12-08 (×10): qty 30

## 2018-12-08 NOTE — Progress Notes (Signed)
PROGRESS NOTE    [name]   AVW:[id number]  DOB: [birth year]  DOA: 12/02/2018 PCP: Karle Plumber, MD   Brief Narrative:  Randy Page 63 y.o. male with medical history significant for hypertension, hyperlipidemia, recent GI bleed due to duodenal ulcer, anemia, chronic neck pain status post cervical fusion who is transferred to [facility] hospital from [facility] for further management of right-sided empyema and MSSA bacteremia. He presented to [name] after a fall at home. He was found to be hypoglycemia and hypotensive (SBP 80s) by EMS.  Lactic acid was 12.3 CT chest > large air and fluid collection in right hemithorax favored to represent a large empyema, complete collapse of RML and near compete collapse of RLL. Tree in bud opacities in RUL and ELL. Emphysematous changes.   He was previously admitted to [facility] from 10/31/18- 11/03/18 for GI bleed found to be due to a duodenal ulcer.    Subjective: Pain improving.  No nausea no vomiting.  No diarrhea reported.    Assessment & Plan:   Principal Problem:   MSSA bacteremia, HCAP,  empyema, acute hypoxic respiratory failure - underwent chest tube on 5/27 - plan at [name] for 3 wks of Cefazolin and Rifampin - I have asked for an ID consult as well  -   repeat blood cultures negative  - repeat pleural fluid cultures pending - CXR 5/30> no significant change in residual pleural and parenchymal disease at the right base - transferred to [facility] for CT surgery to evaluate - 6/1- underwent uncompliacated VATS by Dr Laneta Simmers - wean O2 as able - ID recommend PICC and 2 wks of Cefazolin and Rifampin after VATS- Have PICC  Active Problems:   Hypotension with h/o Essential hypertension - holding antihypertensives - 5/31>  PT note mentions orthostatic hypotension BP drop to 90/65 from 132/61 and he became lightheaded after 45 sec.  - despite fluid replacement, orthostatics continue to be + - TEDS ordered by he  declined them- I have explained the rational for orthostatic hypotension and he is in agreement - Cortisol level 9.7 is a little low for his level of illness- -  Cosyntropin test ordered and administered today but ACTH not drawn by lab - recheck orthostatics    Decubitus ulcer b/l but much larger on right buttock- present on admission - followed by wound care clinic as outpt    - consulted general surgery - underwent debridement after VATS in OR - added air mattress  Hypoalbuminemia, underweight Body mass index is 18.31 kg/m. - in context of current illness - have added Ensure and requested an nutrition eval- recommendations> TID Ensure  Elevated INR -  cont vit K x 3 days  - INR is noted to be improved    Hyperlipidemia - cont Atorvastatin  Hypokalemia -replaced      Chronic pain in neck and back - s/p cervical fusion - cont Oxy IR 15 mg Q4 hrs PRN and Gabapentin    Duodenal ulcer with GI bleed s/p clipping 4/20 - cont Protonix  Anemia- normocytic -  Hb 7.4 -8.5 range -  no prior Hb to compare with -  anemia panel consistent with AOCD  Nicotine abuse - counseled to quit- he states he would like to but his girlfriend also smokes - cont Nicotine patch  Hyperthyroid -cont Tapazol  Deconditioning/ fall on admission - PT eval ordered- recommending HHPT  Time spent in minutes: 35 min DVT prophylaxis: SCDs Code Status: Full code Family Communication:  Disposition Plan: HHPT recommended Consultants:   CT surgery  General surgery Procedures:   Chest tube  VATS  I and D of decubitus Antimicrobials:  Anti-infectives (From admission, onward)   Start     Dose/Rate Route Frequency Ordered Stop   12/02/18 2330  ceFAZolin (ANCEF) IVPB 2g/100 mL premix     2 g 200 mL/hr over 30 Minutes Intravenous Every 8 hours 12/02/18 2322     12/02/18 2330  rifampin (RIFADIN) capsule 300 mg     300 mg Oral Every 12 hours 12/02/18 2322         Objective: Vitals:    12/08/18 1055 12/08/18 1247 12/08/18 1600 12/08/18 1641  BP:  139/82 128/69   Pulse:  94 92   Resp: (!) 24  Temp:   97.7 F (36.5 C)   TempSrc:   Axillary   SpO2: 100% 98% 96% 100%  Weight:      Height:        Intake/Output Summary (Last 24 hours) at 12/08/2018 2008 Last data filed at 12/08/2018 1700 Gross per 24 hour  Intake 1577.16 ml  Output 2180 ml  Net -602.84 ml   Filed Weights   12/06/18 0500 12/07/18 0400 12/08/18 0554  Weight: 59.7 kg 58.5 kg 56.2 kg    Examination:  General exam: Appears comfortable  HEENT: PERRLA, oral mucosa moist, no sclera icterus or thrush Respiratory system: Crackles in RLL, bloody fluid in chest tube- Respiratory effort normal. Cardiovascular system: S1 & S2 heard,  No murmurs - tachycardic  Gastrointestinal system: Abdomen soft, non-tender, nondistended. Normal bowel sounds   Central nervous system: Alert and oriented. No focal neurological deficits. Extremities: No cyanosis, clubbing or edema Skin: dressing just changed by surgery and thus I did no re-open it today Psychiatry:  Mood & affect appropriate.  Psychiatry:  Mood & affect appropriate.   Data Reviewed: I have personally reviewed following labs and imaging studies  CBC: Recent Labs  Lab 12/02/18 2341  12/04/18 1054 12/05/18 0435 12/06/18 0805 12/07/18 0434 12/07/18 1630  WBC 4.2   < > 4.1 5.8 6.0 7.0 6.2  NEUTROABS 2.6  --   --   --   --   --   --   HGB 7.7*   < > 8.5* 8.5* 8.1* 6.8* 8.7*  HCT 25.6*   < > 28.3* 28.0* 27.6* 23.2* 28.0*  MCV 95.9   < > 96.3 95.2 97.2 98.7 94.0  PLT 177   < > 239 248 273 246 284   < > = values in this interval not displayed.   Basic Metabolic Panel: Recent Labs  Lab 12/03/18 0217 12/05/18 0435 12/06/18 0805 12/07/18 0434 12/08/18 0519  NA 136 131* 136 135 136  K 3.4* 4.3 5.0 3.8 3.4*  CL 102 99 102 98 96*  CO2 32  GLUCOSE 93 81 128* 107* 131*  BUN 8 6* 8 8 <5*  CREATININE 0.60* 0.57* 0.74 0.75 0.59*   CALCIUM 7.4* 8.3* 8.2* 8.1* 8.5*   GFR: Estimated Creatinine Clearance: 75.1 mL/min (A) (by C-G formula based on SCr of 0.59 mg/dL (L)). Liver Function Tests: Recent Labs  Lab 12/02/18 2341 12/03/18 0217 12/08/18 0519  AST 16 13* 16  ALT ALKPHOS 99 95 100  BILITOT 0.7 0.7 0.6  PROT 5.5* 5.3* 6.2*  ALBUMIN 1.3* 1.3* 1.7*   No results for input(s): LIPASE, AMYLASE in the last 168 hours. No results for input(s): AMMONIA  in the last 168 hours. Coagulation Profile: Recent Labs  Lab 12/03/18 0217 12/04/18 0342 12/05/18 0435  INR 1.5* 1.4* 1.2   Cardiac Enzymes: No results for input(s): CKTOTAL, CKMB, CKMBINDEX, TROPONINI in the last 168 hours. BNP (last 3 results) No results for input(s): PROBNP in the last 8760 hours. HbA1C: No results for input(s): HGBA1C in the last 72 hours. CBG: Recent Labs  Lab 12/05/18 2152 12/06/18 0638 12/06/18 1107 12/06/18 1648 12/07/18 1234  GLUCAP 184* 130* 217* 107* 98   Lipid Profile: No results for input(s): CHOL, HDL, LDLCALC, TRIG, CHOLHDL, LDLDIRECT in the last 72 hours. Thyroid Function Tests: Recent Labs    12/06/18 0805  FREET4 0.93   Anemia Panel: Recent Labs    12/07/18 1630  VITAMINB12 890  FERRITIN 317  TIBC 165*  IRON 123  RETICCTPCT 1.6   Urine analysis: No results found for: COLORURINE, APPEARANCEUR, LABSPEC, PHURINE, GLUCOSEU, HGBUR, BILIRUBINUR, KETONESUR, PROTEINUR, UROBILINOGEN, NITRITE, LEUKOCYTESUR Sepsis Labs: @LABRCNTIP (procalcitonin:4,lacticidven:4) ) Recent Results (from the past 240 hour(s))  Culture, blood (Routine X 2) w Reflex to ID Panel     Status: None   Collection Time: 12/02/18 11:41 PM  Result Value Ref Range Status   Specimen Description BLOOD RIGHT HAND  Final   Special Requests   Final    BOTTLES DRAWN AEROBIC AND ANAEROBIC Blood Culture adequate volume   Culture   Final    NO GROWTH 5 DAYS Performed at [facility], [address] [place], [place] [address]     Report Status 12/08/2018 FINAL  Final  Culture, blood (Routine X 2) w Reflex to ID Panel     Status: None   Collection Time: 12/02/18 11:41 PM  Result Value Ref Range Status   Specimen Description BLOOD RIGHT HAND  Final   Special Requests   Final    BOTTLES DRAWN AEROBIC AND ANAEROBIC Blood Culture results may not be optimal due to an excessive volume of blood received in culture bottles   Culture   Final    NO GROWTH 5 DAYS Performed at [facility], [address] [place], [place] [address]    Report Status 12/08/2018 FINAL  Final  SARS Coronavirus 2 (CEPHEID - Performed in [facility] hospital lab), Hosp Order     Status: None   Collection Time: 12/03/18  1:40 AM  Result Value Ref Range Status   SARS Coronavirus 2 NEGATIVE NEGATIVE Final    Comment: (NOTE) If result is NEGATIVE SARS-CoV-2 target nucleic acids are NOT DETECTED. The SARS-CoV-2 RNA is generally detectable in upper and lower  respiratory specimens during the acute phase of infection. The lowest  concentration of SARS-CoV-2 viral copies this assay can detect is 250  copies / mL. A negative result does not preclude SARS-CoV-2 infection  and should not be used as the sole basis for treatment or other  patient management decisions.  A negative result may occur with  improper specimen collection / handling, submission of specimen other  than nasopharyngeal swab, presence of viral mutation(s) within the  areas targeted by this assay, and inadequate number of viral copies  (<250 copies / mL). A negative result must be combined with clinical  observations, patient history, and epidemiological information. If result is POSITIVE SARS-CoV-2 target nucleic acids are DETECTED. The SARS-CoV-2 RNA is generally detectable in upper and lower  respiratory specimens dur ing the acute phase of infection.  Positive  results are indicative of active infection with SARS-CoV-2.  Clinical  correlation with patient history and other  diagnostic information is  necessary to determine patient infection status.  Positive results do  not rule out bacterial infection or co-infection with other viruses. If result is PRESUMPTIVE POSTIVE SARS-CoV-2 nucleic acids MAY BE PRESENT.   A presumptive positive result was obtained on the submitted specimen  and confirmed on repeat testing.  While 2019 novel coronavirus  (SARS-CoV-2) nucleic acids may be present in the submitted sample  additional confirmatory testing may be necessary for epidemiological  and / or clinical management purposes  to differentiate between  SARS-CoV-2 and other Sarbecovirus currently known to infect humans.  If clinically indicated additional testing with an alternate test  methodology [phone]) is advised. The SARS-CoV-2 RNA is generally  detectable in upper and lower respiratory sp ecimens during the acute  phase of infection. The expected result is Negative. Fact Sheet for Patients:  BoilerBrush.com.cy Fact Sheet for Healthcare Providers: https://pope.com/ This test is not yet approved or cleared by the [place] FDA and has been authorized for detection and/or diagnosis of SARS-CoV-2 by FDA under an Emergency Use Authorization (EUA).  This EUA will remain in effect (meaning this test can be used) for the duration of the COVID-19 declaration under Section 564(b)(1) of the Act, 21 U.S.C. section 360bbb-3(b)(1), unless the authorization is terminated or revoked sooner. Performed at [facility], [address], [place], [place] [address]   MRSA PCR Screening     Status: None   Collection Time: 12/04/18  8:33 PM  Result Value Ref Range Status   MRSA by PCR NEGATIVE NEGATIVE Final    Comment:        The GeneXpert MRSA Assay (FDA approved for NASAL specimens only), is one component of a comprehensive MRSA colonization surveillance program. It is not intended to diagnose MRSA infection nor  to guide or monitor treatment for MRSA infections. Performed at [facility], [address], [place], [place] [address]   Aerobic/Anaerobic Culture (surgical/deep wound)     Status: None (Preliminary result)   Collection Time: 12/05/18  3:36 PM  Result Value Ref Range Status   Specimen Description FLUID RIGHT LUNG  Final   Special Requests PATIENT ON FOLLOWING ANCEF  Final   Gram Stain   Final    ABUNDANT WBC PRESENT, PREDOMINANTLY PMN FEW GRAM POSITIVE COCCI Performed at [facility], [address], [place], [place] [address]    Culture   Final    RARE STAPHYLOCOCCUS AUREUS SUSCEPTIBILITIES TO FOLLOW NO ANAEROBES ISOLATED; CULTURE IN PROGRESS FOR 5 DAYS    Report Status PENDING  Incomplete  Fungus Culture With Stain     Status: None (Preliminary result)   Collection Time: 12/05/18  3:49 PM  Result Value Ref Range Status   Fungus Stain Final report  Final    Comment: (NOTE) Performed At: [facility] [address] [place], [place] [id number] Jolene Schimke MD [id number]    Fungus (Mycology) Culture PENDING  Incomplete   Fungal Source TISSUE  Final    Comment: RIGHT LUNG Performed at [facility], [address], [place], [place] [address]   Aerobic/Anaerobic Culture (surgical/deep wound)     Status: None (Preliminary result)   Collection Time: 12/05/18  3:49 PM  Result Value Ref Range Status   Specimen Description TISSUE RIGHT LUNG  Final   Special Requests PATIENT ON FOLLOWING ANCEF  Final   Gram Stain   Final    ABUNDANT WBC PRESENT, PREDOMINANTLY  PMN ABUNDANT GRAM POSITIVE COCCI Performed at [facility], [address], [place], [place] [address]    Culture   Final    RARE STAPHYLOCOCCUS AUREUS NO ANAEROBES ISOLATED; CULTURE IN PROGRESS FOR 5 DAYS    Report Status PENDING  Incomplete   Organism ID, Bacteria STAPHYLOCOCCUS AUREUS  Final      Susceptibility   Staphylococcus aureus - MIC*    CIPROFLOXACIN <=0.5 SENSITIVE  Sensitive     ERYTHROMYCIN <=0.25 SENSITIVE Sensitive     GENTAMICIN <=0.5 SENSITIVE Sensitive     OXACILLIN <=0.25 SENSITIVE Sensitive     TETRACYCLINE <=1 SENSITIVE Sensitive     VANCOMYCIN <=0.5 SENSITIVE Sensitive     TRIMETH/SULFA <=10 SENSITIVE Sensitive     CLINDAMYCIN <=0.25 SENSITIVE Sensitive     RIFAMPIN <=0.5 SENSITIVE Sensitive     Inducible Clindamycin NEGATIVE Sensitive     * RARE STAPHYLOCOCCUS AUREUS  Acid Fast Smear (AFB)     Status: None   Collection Time: 12/05/18  3:49 PM  Result Value Ref Range Status   AFB Specimen Processing Comment  Final    Comment: Tissue Grinding and Digestion/Decontamination   Acid Fast Smear Negative  Final    Comment: (NOTE) Performed At: [facility] [address] [place], [place] [id number] Jolene Schimke MD [id number]    Source (AFB) TISSUE  Final    Comment: RIGHT LUNG Performed at [facility], [address], [place], [place] [address]   Fungus Culture Result     Status: None   Collection Time: 12/05/18  3:49 PM  Result Value Ref Range Status   Result 1 Comment  Final    Comment: (NOTE) KOH/Calcofluor preparation:  no fungus observed. Performed At: [facility] [address] [place], [place] [id number] Jolene Schimke MD [id number]          Radiology Studies: Dg Chest Port 1 View  Result Date: 12/08/2018 CLINICAL DATA:  Pleural effusion, shortness of breath. EXAM: PORTABLE CHEST 1 VIEW COMPARISON:  Radiograph December 07, 2018. FINDINGS: The heart size and mediastinal contours are within normal limits. Left lung is clear. Right-sided PICC line is unchanged in position. Stable right basilar atelectasis is noted. Stable position of right sided pleural drainage catheter. Minimal pleural effusion may be present. Stable minimal right basilar pneumothorax is noted. The visualized skeletal structures are unremarkable. IMPRESSION: Stable position of right-sided pleural drainage catheter is noted. Stable  small right basilar pneumothorax is noted with associated atelectasis of the right lower lobe. Electronically Signed   By: Lupita Raider M.D.   On: 12/08/2018 09:10   Dg Chest Port 1 View  Result Date: 12/07/2018 CLINICAL DATA:  Follow-up pleural effusion EXAM: PORTABLE CHEST 1 VIEW COMPARISON:  12/06/2010 FINDINGS: Coil chest tube is noted in the right base. Small right basilar pneumothorax is noted. Right-sided PICC line is noted in satisfactory position. The lungs again demonstrates some atelectatic changes related to the basilar pneumothorax on the right. No other focal abnormality is noted. IMPRESSION: Stable right basilar pneumothorax with associated atelectasis. Electronically Signed   By: Alcide Clever M.D.   On: 12/07/2018 08:59      Scheduled Meds:  sodium chloride   Intravenous Once   atorvastatin  20 mg Oral QHS   bisacodyl  10 mg Oral Daily   doxepin  100 mg Oral QHS   feeding supplement (ENSURE ENLIVE)  237 mL Oral TID BM   feeding supplement (PRO-STAT SUGAR FREE 64)  30 mL Oral  BID   gabapentin  600 mg Oral QID   morphine   Intravenous Q4H   multivitamin with minerals  1 tablet Oral Daily   nicotine  14 mg Transdermal Daily   pantoprazole  40 mg Oral Daily   rifampin  300 mg Oral Q12H   senna-docusate  1 tablet Oral QHS   sodium chloride flush  10-40 mL Intracatheter Q12H   tiZANidine  4 mg Oral QHS   Continuous Infusions:   ceFAZolin (ANCEF) IV 2 g (12/08/18 1327)   dextrose 5% lactated ringers 10 mL/hr at 12/06/18 0805     LOS: 6 days      Lynden Oxford, MD [facility] Pager: [email or web address] Password [email or web address] 12/08/2018, 8:08 PM

## 2018-12-08 NOTE — Progress Notes (Signed)
Changed dressing on sacral per order. Patient tolerated well.

## 2018-12-08 NOTE — Plan of Care (Signed)
  Problem: Clinical Measurements: Goal: Respiratory complications will improve Outcome: Progressing Goal: Cardiovascular complication will be avoided Outcome: Progressing   Problem: Nutrition: Goal: Adequate nutrition will be maintained Outcome: Progressing   Problem: Elimination: Goal: Will not experience complications related to bowel motility Outcome: Progressing Goal: Will not experience complications related to urinary retention Outcome: Progressing   Problem: Health Behavior/Discharge Planning: Goal: Ability to manage health-related needs will improve Outcome: Not Progressing   Problem: Clinical Measurements: Goal: Will remain free from infection Outcome: Not Progressing

## 2018-12-08 NOTE — Progress Notes (Addendum)
      [address]       [place] [address]             [phone]       3 Days Post-Op Procedure(s) (LRB): RIGHT VIDEO ASSISTED THORACOSCOPY DRAIN EMPYEMA/LUNG ABSCESS (Right) Excisional Debridement Right Sacral Wound (Right)  Subjective: Patient without specific complaints this am  Objective: Vital signs in last 24 hours: Temp:  [98 F (36.7 C)-99.2 F (37.3 C)] 98.8 F (37.1 C) (06/04 0554) Pulse Rate:  [82-100] 87 (06/04 0554) Cardiac Rhythm: Sinus tachycardia (06/04 0700) Resp:  [9-21] 21 (06/04 0554) BP: (140-165)/(70-80) 153/72 (06/04 0554) SpO2:  [98 %-100 %] 100 % (06/04 0554) Weight:  [56.2 kg] 56.2 kg (06/04 0554)     Intake/Output from previous day: 06/03 0701 - 06/04 0700 In: 1110.9 [I.V.:810.9; IV Piggyback:300] Out: 2810 [Urine:2600; Chest Tube:210]   Physical Exam:  Cardiovascular: Slightly tachycardic Pulmonary: Diminished breath sounds bilaterally Abdomen: Soft, non tender, bowel sounds present. Extremities: No LE edema Wounds: Dressing is clean and dry.   Chest Tube: to suction;+1-2 air leak   Lab Results: CBC: Recent Labs    12/07/18 0434 12/07/18 1630  WBC 7.0 6.2  HGB 6.8* 8.7*  HCT 23.2* 28.0*  PLT 246 284   BMET:  Recent Labs    12/07/18 0434 12/08/18 0519  NA 135 136  K 3.8 3.4*  CL 98 96*  CO2 29 32  GLUCOSE 107* 131*  BUN 8 <5*  CREATININE 0.75 0.59*  CALCIUM 8.1* 8.5*    PT/INR:  No results for input(s): LABPROT, INR in the last 72 hours. ABG:  INR: Will add last result for INR, ABG once components are confirmed Will add last 4 CBG results once components are confirmed  Assessment/Plan:  1. CV - Sinus tachycardia at times  into low 100's at times. 2.  Pulmonary - On 2 liters of oxygen via [place]. Chest tube with 210 cc since surgery. Chest tube is to suction and there is a +1-2 air leak. CXR this am not taken yet. Encourage incentive spirometer. Gram stain of pleural fluid showed gram positive  cocci; culture shows rare Staph Aureus 3. ID- on Cefazolin 2 grams IV tid and Rifampin 300 mg orally bid 4. Anemia-H and H this am increased to 8.7 and 28 (s/p transfusion) 5. Supplement potassium 6. Sacral wound-s/p debridement, dressing changes per general surgery  Donielle M ZimmermanPA-C 12/08/2018,7:19 AM [phone]     Chart reviewed, patient examined, agree with above. Still has purulent drainage from the catheter and 1-2+ air leak. Continue tube to suction.

## 2018-12-08 NOTE — TOC Initial Note (Addendum)
Transition of Care (TOC) - Initial/Assessment Note  Donn Pierini RN, BSN Transitions of Care Unit 4E- RN Case Manager [phone]   Patient Details  Name: Randy Page MRN: [id number] Date of Birth: 1956/05/11  Transition of Care [facility]) CM/SW Contact:    Darrold Span, RN Phone Number: 12/08/2018, 3:22 PM  Clinical Narrative:                 Pt admitted with MSSA bacteremia, s/p VATs on 6/1. PICC placed for home IV abx needs. Per ID pt will need 2 wks of IV abx post VATS. Notified by Elita Quick with [employer] that ID has contacted them for potential IV abx needs. CM spoke with pt at bedside for transition of care needs. Pt currently still has CT and PCA. Discussed needs for transition that includes HHRN/PT, iv abx needs and DME- RW. List provided to pt Per CMS guidelines from medicare.gov website with star ratings (copy placed in chart)- pt to review list with SO and CM to f/u on choice in order to make referral- will need orders prior to discharge. Per pt SO will be able to assist with iv abx at home.   Expected Discharge Plan: Home w Home Health Services Barriers to Discharge: Continued Medical Work up   Patient Goals and CMS Choice Patient states their goals for this hospitalization and ongoing recovery are:: "to get well, to get stronger, to be able to walk better" CMS Medicare.gov Compare Post Acute Care list provided to:: Patient Choice offered to / list presented to : Patient  Expected Discharge Plan and Services Expected Discharge Plan: Home w Home Health Services   Discharge Planning Services: CM Consult Post Acute Care Choice: Home Health, Durable Medical Equipment Living arrangements for the past 2 months: Apartment                 DME Arranged: Walker rolling-- need DME order prior to discharge         HH Arranged: IV Antibiotics, RN, PT-- will need MD orders prior to discharge.           Prior Living Arrangements/Services Living arrangements for the past 2  months: Apartment Lives with:: Self, Significant Other Patient language and need for interpreter reviewed:: Yes Do you feel safe going back to the place where you live?: Yes      Need for Family Participation in Patient Care: Yes (Comment) Care giver support system in place?: Yes (comment) Current home services: DME Criminal Activity/Legal Involvement Pertinent to Current Situation/Hospitalization: No - Comment as needed  Activities of Daily Living      Permission Sought/Granted Permission sought to share information with : Case Manager, Magazine features editor Permission granted to share information with : Yes, Verbal Permission Granted     Permission granted to share info w AGENCY: HH agency        Emotional Assessment Appearance:: Appears older than stated age Attitude/Demeanor/Rapport: Engaged Affect (typically observed): Appropriate, Pleasant, Hopeful Orientation: : Oriented to Self, Oriented to Situation, Oriented to Place, Oriented to  Time   Psych Involvement: No (comment)  Admission diagnosis:  Empyema of RT Lung w Bacteremia  Patient Active Problem List   Diagnosis Date Noted  . Empyema (HCC) 12/05/2018  . Elevated INR 12/03/2018  . Hyperlipidemia 12/02/2018  . MSSA bacteremia 12/02/2018  . Empyema of right pleural space (HCC) 12/02/2018  . Chronic pain 12/02/2018  . Duodenal ulcer 12/02/2018  . Decubitus ulcer 12/02/2018  . Essential hypertension 09/15/2017  .  Mixed dyslipidemia 09/15/2017  . Cigarette smoker 09/15/2017  . Carotid artery stenosis 09/15/2017  . Atherosclerotic vascular disease 09/15/2017  . SOB (shortness of breath) on exertion 09/15/2017   PCP:  Karle PlumberArvind, Moogali M, MD Pharmacy:   [facility] [address] - [place], [place] - [address] [place] [id number] Phone: [phone] Fax: [phone]     Social Determinants of Health (SDOH) Interventions    Readmission Risk Interventions No flowsheet data  found.

## 2018-12-08 NOTE — Progress Notes (Signed)
Notified by CCMD that patient went into SVT @0400 . Patient resting and asymptomatic. Will continue to monitor

## 2018-12-08 NOTE — Progress Notes (Signed)
Nutrition Follow-up  DOCUMENTATION CODES:   Not applicable  INTERVENTION:   -Continue Ensure Enlive po to TID, each supplement provides 350 kcal and 20 grams of protein -30 ml Prostat BID, each supplement provides 100 kcals and 15 grams protein. -Continue MVI with minerals daily  NUTRITION DIAGNOSIS:   Increased nutrient needs related to wound healing as evidenced by estimated needs.  Ongoing  GOAL:   Patient will meet greater than or equal to 90% of their needs  Progressing  MONITOR:   PO intake, Supplement acceptance, Labs, Weight trends, Skin, I & O's  REASON FOR ASSESSMENT:   Consult Assessment of nutrition requirement/status  ASSESSMENT:   Randy Page is a 63 y.o. male with medical history significant for hypertension, hyperlipidemia, recent GI bleed due to duodenal ulcer, anemia, chronic neck pain status post cervical fusion who is transferred to [facility] hospital from [facility] for further management of right-sided empyema and MSSA bacteremia.   4/27-4/30- s/p EGD and clipping for anemia duodenal ulcer during [facility] admission 5/27- s/p IR thoracostomy  6/1- VATS, drainage of empyema, debridement of sacral wound   Unable to reach pt by phone x2. Meal completions charted as 25-75% for pt's last five meals. Per flowsheet, pt had Ensure this am but refused the one scheduled for midday. RD to add Prostat to maximize protein. Will attempt to speak with pt later if possible.   Weight noted to decrease from 58.7 kg on 5/30 to 56.2 kg today.   I/O: -7,879 ml since admit UOP: 2,600 ml x 24 hrs  Right chest tube: 210 ml x 24hrs   Drips: D5 in LR @ 10 ml/hr  Medications: dulcolax, MVI with minerals, senokot Labs: K 3.4 (L)   Diet Order:   Diet Order            Diet regular Room service appropriate? Yes; Fluid consistency: Thin  Diet effective now              EDUCATION NEEDS:   No education needs have been identified at this time  Skin:   Skin Assessment: Skin Integrity Issues: Skin Integrity Issues:: Unstageable Unstageable: rt buttocks  Last BM:  6/4  Height:   Ht Readings from Last 1 Encounters:  12/02/18 5\' 9"  (1.753 m)    Weight:   Wt Readings from Last 1 Encounters:  12/08/18 56.2 kg    Ideal Body Weight:  72.7 kg  BMI:  Body mass index is 18.31 kg/m.  Estimated Nutritional Needs:   Kcal:  2150-2350  Protein:  115-130 grams  Fluid:  > 2.2 L  Vanessa Kick RD, LDN Clinical Nutrition Pager # [phone]

## 2018-12-08 NOTE — Progress Notes (Signed)
Physical Therapy Treatment Patient Details Name: Randy Page MRN: [id number] DOB: 09/29/1955 Today's Date: 12/08/2018    History of Present Illness Randy Page 63 y.o. male with medical history significant for hypertension, hyperlipidemia, recent GI bleed due to duodenal ulcer, anemia, chronic neck pain status post cervical fusion who is transferred to [facility] hospital from [facility] for further management of right-sided empyema and MSSA bacteremia. s/p EXCISIONAL DEBRIDEMENT OF RIGHT SACRAL WOUND on 12/05/18    PT Comments    Pt was anxious about mobilizing, but pt agreeable with encouragement.  Emphasis on transfer safety, gait stability/stamina with eye on sats and HR.   Follow Up Recommendations  Home health PT;Supervision for mobility/OOB     Equipment Recommendations  None recommended by PT    Recommendations for Other Services       Precautions / Restrictions Precautions Precautions: Other (comment)(Chest tube)    Mobility  Bed Mobility Overal bed mobility: Modified Independent                Transfers Overall transfer level: Needs assistance   Transfers: Sit to/from Stand Sit to Stand: Min guard         General transfer comment: cues for hand placement  Ambulation/Gait Ambulation/Gait assistance: Min assist;Min guard Gait Distance (Feet): 130 Feet Assistive device: Rolling walker (2 wheeled) Gait Pattern/deviations: Step-through pattern Gait velocity: slower   General Gait Details: mildly unsteady overall with R list, episodes of scisssoring to steady himself.  Pt asked to try without assist and became more unsteady without LOB   Stairs             Wheelchair Mobility    Modified Rankin (Stroke Patients Only)       Balance Overall balance assessment: Needs assistance   Sitting balance-Leahy Scale: Good       Standing balance-Leahy Scale: Fair Standing balance comment: Slight sway in standing                             Cognition Arousal/Alertness: Awake/alert Behavior During Therapy: WFL for tasks assessed/performed;Anxious Overall Cognitive Status: Within Functional Limits for tasks assessed                                        Exercises      General Comments General comments (skin integrity, edema, etc.): During gait sats held at 94% or greater and EHR rose into the upper 110's from rest in the 90's bpm.      Pertinent Vitals/Pain Pain Assessment: Faces Faces Pain Scale: Hurts little more Pain Location: chest tube site and buttocks Pain Descriptors / Indicators: Discomfort;Guarding;Grimacing Pain Intervention(s): Monitored during session    Home Living                      Prior Function            PT Goals (current goals can now be found in the care plan section) Acute Rehab PT Goals Patient Stated Goal: Get well PT Goal Formulation: With patient Time For Goal Achievement: 12/17/18 Potential to Achieve Goals: Good Progress towards PT goals: Progressing toward goals    Frequency    Min 3X/week      PT Plan Current plan remains appropriate    Co-evaluation  AM-PAC PT "6 Clicks" Mobility   Outcome Measure  Help needed turning from your back to your side while in a flat bed without using bedrails?: None Help needed moving from lying on your back to sitting on the side of a flat bed without using bedrails?: None Help needed moving to and from a bed to a chair (including a wheelchair)?: A Little Help needed standing up from a chair using your arms (e.g., wheelchair or bedside chair)?: A Little Help needed to walk in hospital room?: A Little Help needed climbing 3-5 steps with a railing? : A Little 6 Click Score: 20    End of Session   Activity Tolerance: Patient tolerated treatment well;Other (comment) Patient left: in bed;with call bell/phone within reach Nurse Communication: Mobility status PT Visit Diagnosis:  Unsteadiness on feet (R26.81);Muscle weakness (generalized) (M62.81);History of falling (Z91.81);Difficulty in walking, not elsewhere classified (R26.2)     Time: [id number] PT Time Calculation (min) (ACUTE ONLY): 24 min  Charges:  $Gait Training: 8-22 mins $Therapeutic Activity: 8-22 mins                     12/08/2018  Randy Page, PT Acute Rehabilitation Services [phone]  (pager) [phone]  (office)   Randy Page 12/08/2018, 3:17 PM

## 2018-12-08 NOTE — Evaluation (Deleted)
Physical Therapy Evaluation Patient Details Name: Randy Page MRN: [id number] DOB: 07-04-1956 Today's Date: 12/08/2018   History of Present Illness  Randy Page 63 y.o. male with medical history significant for hypertension, hyperlipidemia, recent GI bleed due to duodenal ulcer, anemia, chronic neck pain status post cervical fusion who is transferred to [facility] hospital from [facility] for further management of right-sided empyema and MSSA bacteremia. s/p EXCISIONAL DEBRIDEMENT OF RIGHT SACRAL WOUND on 12/05/18  Clinical Impression  Pt was anxious about mobilizing, but pt agreeable with encouragement.  Emphasis on transfer safety, gait stability/stamina with eye on sats and HR.    Follow Up Recommendations Home health PT;Supervision for mobility/OOB    Equipment Recommendations  None recommended by PT    Recommendations for Other Services       Precautions / Restrictions Precautions Precautions: Other (comment)(Chest tube)      Mobility  Bed Mobility Overal bed mobility: Modified Independent                Transfers Overall transfer level: Needs assistance   Transfers: Sit to/from Stand Sit to Stand: Min guard         General transfer comment: cues for hand placement  Ambulation/Gait Ambulation/Gait assistance: Min assist;Min guard Gait Distance (Feet): 130 Feet Assistive device: Rolling walker (2 wheeled) Gait Pattern/deviations: Step-through pattern Gait velocity: slower   General Gait Details: mildly unsteady overall with R list, episodes of scisssoring to steady himself.  Pt asked to try without assist and became more unsteady without LOB  Stairs            Wheelchair Mobility    Modified Rankin (Stroke Patients Only)       Balance Overall balance assessment: Needs assistance   Sitting balance-Leahy Scale: Good       Standing balance-Leahy Scale: Fair Standing balance comment: Slight sway in standing                              Pertinent Vitals/Pain Pain Assessment: Faces Faces Pain Scale: Hurts little more Pain Location: chest tube site and buttocks Pain Descriptors / Indicators: Discomfort;Guarding;Grimacing Pain Intervention(s): Monitored during session    Home Living                        Prior Function                 Hand Dominance        Extremity/Trunk Assessment                Communication      Cognition Arousal/Alertness: Awake/alert Behavior During Therapy: WFL for tasks assessed/performed;Anxious Overall Cognitive Status: Within Functional Limits for tasks assessed                                        General Comments General comments (skin integrity, edema, etc.): During gait sats held at 94% or greater and EHR rose into the upper 110's from rest in the 90's bpm.    Exercises     Assessment/Plan    PT Assessment    PT Problem List         PT Treatment Interventions      PT Goals (Current goals can be found in the Care Plan section)  Acute Rehab PT Goals Patient Stated Goal: Get  well PT Goal Formulation: With patient Time For Goal Achievement: 12/17/18 Potential to Achieve Goals: Good    Frequency Min 3X/week   Barriers to discharge        Co-evaluation               AM-PAC PT "6 Clicks" Mobility  Outcome Measure Help needed turning from your back to your side while in a flat bed without using bedrails?: None Help needed moving from lying on your back to sitting on the side of a flat bed without using bedrails?: None Help needed moving to and from a bed to a chair (including a wheelchair)?: A Little Help needed standing up from a chair using your arms (e.g., wheelchair or bedside chair)?: A Little Help needed to walk in hospital room?: A Little Help needed climbing 3-5 steps with a railing? : A Little 6 Click Score: 20    End of Session   Activity Tolerance: Patient tolerated treatment well;Other  (comment) Patient left: in bed;with call bell/phone within reach Nurse Communication: Mobility status PT Visit Diagnosis: Unsteadiness on feet (R26.81);Muscle weakness (generalized) (M62.81);History of falling (Z91.81);Difficulty in walking, not elsewhere classified (R26.2)    Time: [id number] PT Time Calculation (min) (ACUTE ONLY): 24 min   Charges:              12/08/2018  [place] BingKen Mylene Bow, PT Acute Rehabilitation Services [phone]  (pager) [phone]  (office)  Eliseo GumKenneth V Taegen Delker 12/08/2018, 3:15 PM

## 2018-12-09 ENCOUNTER — Inpatient Hospital Stay (HOSPITAL_COMMUNITY): Payer: Medicare Other

## 2018-12-09 NOTE — Progress Notes (Signed)
PROGRESS NOTE    Randy Page   RUE:[id number]  DOB: [birth year]  DOA: 12/02/2018 PCP: Karle Plumber, MD   Brief Narrative:  Randy Page 63 y.o. male with medical history significant for hypertension, hyperlipidemia, recent GI bleed due to duodenal ulcer, anemia, chronic neck pain status post cervical fusion who is transferred to [facility] hospital from [facility] for further management of right-sided empyema and MSSA bacteremia. He presented to [name] after a fall at home. He was found to be hypoglycemia and hypotensive (SBP 80s) by EMS.  Lactic acid was 12.3 CT chest > large air and fluid collection in right hemithorax favored to represent a large empyema, complete collapse of RML and near compete collapse of RLL. Tree in bud opacities in RUL and ELL. Emphysematous changes.   He was previously admitted to [facility] from 10/31/18- 11/03/18 for GI bleed found to be due to a duodenal ulcer.    Subjective: No acute complaint no nausea no vomiting no fever no chills.  No chest pain abdominal pain.  Assessment & Plan:   Principal Problem:   MSSA bacteremia, HCAP,  empyema, acute hypoxic respiratory failure - underwent chest tube on 5/27 - plan at [name] for 3 wks of Cefazolin and Rifampin - I have asked for an ID consult as well  -   repeat blood cultures negative  - repeat pleural fluid cultures pending - CXR 5/30> no significant change in residual pleural and parenchymal disease at the right base - transferred to [facility] for CT surgery to evaluate - 6/1- underwent uncompliacated VATS by Dr Laneta Simmers cultures are growing staph aureus sensitivity pending. - wean O2 as able - ID recommend PICC and 2 wks of Cefazolin and Rifampin after VATS- Have PICC  Active Problems:   Hypotension with h/o Essential hypertension - holding antihypertensives - 5/31>  PT note mentions orthostatic hypotension BP drop to 90/65 from 132/61 and he became lightheaded after 45 sec.  -  despite fluid replacement, orthostatics continue to be + - TEDS ordered by he declined them- I have explained the rational for orthostatic hypotension and he is in agreement - Cortisol level 9.7 is a little low for his level of illness- -  Cosyntropin test ordered and administered today but ACTH not drawn by lab - recheck orthostatics    Decubitus ulcer b/l but much larger on right buttock- present on admission - followed by wound care clinic as outpt    - consulted general surgery - underwent debridement after VATS in OR - added air mattress  Hypoalbuminemia, underweight Body mass index is 17.77 kg/m. - in context of current illness - have added Ensure and requested an nutrition eval- recommendations> TID Ensure  Elevated INR -  cont vit K x 3 days  - INR is noted to be improved    Hyperlipidemia - cont Atorvastatin  Hypokalemia -replaced      Chronic pain in neck and back - s/p cervical fusion - cont Oxy IR 15 mg Q4 hrs PRN and Gabapentin    Duodenal ulcer with GI bleed s/p clipping 4/20 - cont Protonix  Anemia- normocytic -  Hb 7.4 -8.5 range -  no prior Hb to compare with -  anemia panel consistent with AOCD  Nicotine abuse - counseled to quit- he states he would like to but his girlfriend also smokes - cont Nicotine patch  Hyperthyroid -cont Tapazol  Deconditioning/ fall on admission - PT eval ordered- recommending HHPT  Time spent in minutes:  35 min DVT prophylaxis: SCDs Code Status: Full code Family Communication:  Disposition Plan: HHPT recommended Consultants:   CT surgery  General surgery Procedures:   Chest tube  VATS  I and D of decubitus Antimicrobials:  Anti-infectives (From admission, onward)   Start     Dose/Rate Route Frequency Ordered Stop   12/02/18 2330  ceFAZolin (ANCEF) IVPB 2g/100 mL premix     2 g 200 mL/hr over 30 Minutes Intravenous Every 8 hours 12/02/18 2322 12/18/18 2359   12/02/18 2330  rifampin (RIFADIN) capsule  300 mg     300 mg Oral Every 12 hours 12/02/18 2322 12/18/18 2359       Objective: Vitals:   12/09/18 0447 12/09/18 1026 12/09/18 1413 12/09/18 1959  BP:  (!) 115/58 124/79 132/64  Pulse:  92 93 87  Resp: 16 18 17 18   Temp:  98.8 F (37.1 C) 99.6 F (37.6 C) 99 F (37.2 C)  TempSrc:  Oral Oral Oral  SpO2:  97% 96% 99%  Weight:      Height:        Intake/Output Summary (Last 24 hours) at 12/09/2018 2050 Last data filed at 12/09/2018 2001 Gross per 24 hour  Intake 996.15 ml  Output 2250 ml  Net -1253.85 ml   Filed Weights   12/07/18 0400 12/08/18 0554 12/09/18 0411  Weight: 58.5 kg 56.2 kg 54.6 kg    Examination:  General exam: Appears comfortable  HEENT: PERRLA, oral mucosa moist, no sclera icterus or thrush Respiratory system: Crackles in RLL, bloody fluid in chest tube- Respiratory effort normal. Cardiovascular system: S1 & S2 heard,  No murmurs - tachycardic  Gastrointestinal system: Abdomen soft, non-tender, nondistended. Normal bowel sounds   Central nervous system: Alert and oriented. No focal neurological deficits. Extremities: No cyanosis, clubbing or edema Skin: dressing just changed by surgery and thus I did no re-open it today Psychiatry:  Mood & affect appropriate.  Psychiatry:  Mood & affect appropriate.   Data Reviewed: I have personally reviewed following labs and imaging studies  CBC: Recent Labs  Lab 12/02/18 2341  12/04/18 1054 12/05/18 0435 12/06/18 0805 12/07/18 0434 12/07/18 1630  WBC 4.2   < > 4.1 5.8 6.0 7.0 6.2  NEUTROABS 2.6  --   --   --   --   --   --   HGB 7.7*   < > 8.5* 8.5* 8.1* 6.8* 8.7*  HCT 25.6*   < > 28.3* 28.0* 27.6* 23.2* 28.0*  MCV 95.9   < > 96.3 95.2 97.2 98.7 94.0  PLT 177   < > 239 248 273 246 284   < > = values in this interval not displayed.   Basic Metabolic Panel: Recent Labs  Lab 12/03/18 0217 12/05/18 0435 12/06/18 0805 12/07/18 0434 12/08/18 0519  NA 136 131* 136 135 136  K 3.4* 4.3 5.0 3.8 3.4*   CL 102 99 102 98 96*  CO2 25 25 28 29  32  GLUCOSE 93 81 128* 107* 131*  BUN 8 6* 8 8 <5*  CREATININE 0.60* 0.57* 0.74 0.75 0.59*  CALCIUM 7.4* 8.3* 8.2* 8.1* 8.5*   GFR: Estimated Creatinine Clearance: 73 mL/min (A) (by C-G formula based on SCr of 0.59 mg/dL (L)). Liver Function Tests: Recent Labs  Lab 12/02/18 2341 12/03/18 0217 12/08/18 0519  AST 16 13* 16  ALT 7 8 6   ALKPHOS 99 95 100  BILITOT 0.7 0.7 0.6  PROT 5.5* 5.3* 6.2*  ALBUMIN 1.3* 1.3* 1.7*  No results for input(s): LIPASE, AMYLASE in the last 168 hours. No results for input(s): AMMONIA in the last 168 hours. Coagulation Profile: Recent Labs  Lab 12/03/18 0217 12/04/18 0342 12/05/18 0435  INR 1.5* 1.4* 1.2   Cardiac Enzymes: No results for input(s): CKTOTAL, CKMB, CKMBINDEX, TROPONINI in the last 168 hours. BNP (last 3 results) No results for input(s): PROBNP in the last 8760 hours. HbA1C: No results for input(s): HGBA1C in the last 72 hours. CBG: Recent Labs  Lab 12/05/18 2152 12/06/18 0638 12/06/18 1107 12/06/18 1648 12/07/18 1234  GLUCAP 184* 130* 217* 107* 98   Lipid Profile: No results for input(s): CHOL, HDL, LDLCALC, TRIG, CHOLHDL, LDLDIRECT in the last 72 hours. Thyroid Function Tests: No results for input(s): TSH, T4TOTAL, FREET4, T3FREE, THYROIDAB in the last 72 hours. Anemia Panel: Recent Labs    12/07/18 1630  VITAMINB12 890  FERRITIN 317  TIBC 165*  IRON 123  RETICCTPCT 1.6   Urine analysis: No results found for: COLORURINE, APPEARANCEUR, LABSPEC, PHURINE, GLUCOSEU, HGBUR, BILIRUBINUR, KETONESUR, PROTEINUR, UROBILINOGEN, NITRITE, LEUKOCYTESUR Sepsis Labs: (procalcitonin:4,lacticidven:4) ) Recent Results (from the past 240 hour(s))  Culture, blood (Routine X 2) w Reflex to ID Panel     Status: None   Collection Time: 12/02/18 11:41 PM  Result Value Ref Range Status   Specimen Description BLOOD RIGHT HAND  Final   Special Requests   Final    BOTTLES DRAWN  AEROBIC AND ANAEROBIC Blood Culture adequate volume   Culture   Final    NO GROWTH 5 DAYS Performed at [facility], [address] [place], [place] [address]    Report Status 12/08/2018 FINAL  Final  Culture, blood (Routine X 2) w Reflex to ID Panel     Status: None   Collection Time: 12/02/18 11:41 PM  Result Value Ref Range Status   Specimen Description BLOOD RIGHT HAND  Final   Special Requests   Final    BOTTLES DRAWN AEROBIC AND ANAEROBIC Blood Culture results may not be optimal due to an excessive volume of blood received in culture bottles   Culture   Final    NO GROWTH 5 DAYS Performed at [facility], [address], [place], [place] [address]    Report Status 12/08/2018 FINAL  Final  SARS Coronavirus 2 (CEPHEID - Performed in [facility] hospital lab), Hosp Order     Status: None   Collection Time: 12/03/18  1:40 AM  Result Value Ref Range Status   SARS Coronavirus 2 NEGATIVE NEGATIVE Final    Comment: (NOTE) If result is NEGATIVE SARS-CoV-2 target nucleic acids are NOT DETECTED. The SARS-CoV-2 RNA is generally detectable in upper and lower  respiratory specimens during the acute phase of infection. The lowest  concentration of SARS-CoV-2 viral copies this assay can detect is 250  copies / mL. A negative result does not preclude SARS-CoV-2 infection  and should not be used as the sole basis for treatment or other  patient management decisions.  A negative result may occur with  improper specimen collection / handling, submission of specimen other  than nasopharyngeal swab, presence of viral mutation(s) within the  areas targeted by this assay, and inadequate number of viral copies  (<250 copies / mL). A negative result must be combined with clinical  observations, patient history, and epidemiological information. If result is POSITIVE SARS-CoV-2 target nucleic acids are DETECTED. The SARS-CoV-2 RNA is generally detectable in upper and lower  respiratory  specimens dur ing  the acute phase of infection.  Positive  results are indicative of active infection with SARS-CoV-2.  Clinical  correlation with patient history and other diagnostic information is  necessary to determine patient infection status.  Positive results do  not rule out bacterial infection or co-infection with other viruses. If result is PRESUMPTIVE POSTIVE SARS-CoV-2 nucleic acids MAY BE PRESENT.   A presumptive positive result was obtained on the submitted specimen  and confirmed on repeat testing.  While 2019 novel coronavirus  (SARS-CoV-2) nucleic acids may be present in the submitted sample  additional confirmatory testing may be necessary for epidemiological  and / or clinical management purposes  to differentiate between  SARS-CoV-2 and other Sarbecovirus currently known to infect humans.  If clinically indicated additional testing with an alternate test  methodology [phone]) is advised. The SARS-CoV-2 RNA is generally  detectable in upper and lower respiratory sp ecimens during the acute  phase of infection. The expected result is Negative. Fact Sheet for Patients:  BoilerBrush.com.cy Fact Sheet for Healthcare Providers: https://pope.com/ This test is not yet approved or cleared by the [place] FDA and has been authorized for detection and/or diagnosis of SARS-CoV-2 by FDA under an Emergency Use Authorization (EUA).  This EUA will remain in effect (meaning this test can be used) for the duration of the COVID-19 declaration under Section 564(b)(1) of the Act, 21 U.S.C. section 360bbb-3(b)(1), unless the authorization is terminated or revoked sooner. Performed at [facility], [address] [place], [place] [address]   MRSA PCR Screening     Status: None   Collection Time: 12/04/18  8:33 PM  Result Value Ref Range Status   MRSA by PCR NEGATIVE NEGATIVE Final    Comment:        The GeneXpert MRSA  Assay (FDA approved for NASAL specimens only), is one component of a comprehensive MRSA colonization surveillance program. It is not intended to diagnose MRSA infection nor to guide or monitor treatment for MRSA infections. Performed at [facility], [address] [place], [place] [address]   Aerobic/Anaerobic Culture (surgical/deep wound)     Status: None (Preliminary result)   Collection Time: 12/05/18  3:36 PM  Result Value Ref Range Status   Specimen Description FLUID RIGHT LUNG  Final   Special Requests PATIENT ON FOLLOWING ANCEF  Final   Gram Stain   Final    ABUNDANT WBC PRESENT, PREDOMINANTLY PMN FEW GRAM POSITIVE COCCI Performed at [facility], [address], [place], [place] [address]    Culture   Final    RARE STAPHYLOCOCCUS AUREUS NO ANAEROBES ISOLATED; CULTURE IN PROGRESS FOR 5 DAYS    Report Status PENDING  Incomplete   Organism ID, Bacteria STAPHYLOCOCCUS AUREUS  Final      Susceptibility   Staphylococcus aureus - MIC*    CIPROFLOXACIN <=0.5 SENSITIVE Sensitive     ERYTHROMYCIN <=0.25 SENSITIVE Sensitive     GENTAMICIN <=0.5 SENSITIVE Sensitive     OXACILLIN <=0.25 SENSITIVE Sensitive     TETRACYCLINE <=1 SENSITIVE Sensitive     VANCOMYCIN 1 SENSITIVE Sensitive     TRIMETH/SULFA <=10 SENSITIVE Sensitive     CLINDAMYCIN <=0.25 SENSITIVE Sensitive     RIFAMPIN <=0.5 SENSITIVE Sensitive     Inducible Clindamycin NEGATIVE Sensitive     * RARE STAPHYLOCOCCUS AUREUS  Fungus Culture With Stain     Status: None (Preliminary result)   Collection Time: 12/05/18  3:49 PM  Result Value Ref Range Status  Fungus Stain Final report  Final    Comment: (NOTE) Performed At: [facility] [address] [place], [place] [id number] Jolene Schimke MD MW:[id number]    Fungus (Mycology) Culture PENDING  Incomplete   Fungal Source TISSUE  Final    Comment: RIGHT LUNG Performed at [facility], [address], [place], [place] [address]    Aerobic/Anaerobic Culture (surgical/deep wound)     Status: None (Preliminary result)   Collection Time: 12/05/18  3:49 PM  Result Value Ref Range Status   Specimen Description TISSUE RIGHT LUNG  Final   Special Requests PATIENT ON FOLLOWING ANCEF  Final   Gram Stain   Final    ABUNDANT WBC PRESENT, PREDOMINANTLY PMN ABUNDANT GRAM POSITIVE COCCI Performed at [facility], [address] [place], [place] [address]    Culture   Final    RARE STAPHYLOCOCCUS AUREUS NO ANAEROBES ISOLATED; CULTURE IN PROGRESS FOR 5 DAYS    Report Status PENDING  Incomplete   Organism ID, Bacteria STAPHYLOCOCCUS AUREUS  Final      Susceptibility   Staphylococcus aureus - MIC*    CIPROFLOXACIN <=0.5 SENSITIVE Sensitive     ERYTHROMYCIN <=0.25 SENSITIVE Sensitive     GENTAMICIN <=0.5 SENSITIVE Sensitive     OXACILLIN <=0.25 SENSITIVE Sensitive     TETRACYCLINE <=1 SENSITIVE Sensitive     VANCOMYCIN <=0.5 SENSITIVE Sensitive     TRIMETH/SULFA <=10 SENSITIVE Sensitive     CLINDAMYCIN <=0.25 SENSITIVE Sensitive     RIFAMPIN <=0.5 SENSITIVE Sensitive     Inducible Clindamycin NEGATIVE Sensitive     * RARE STAPHYLOCOCCUS AUREUS  Acid Fast Smear (AFB)     Status: None   Collection Time: 12/05/18  3:49 PM  Result Value Ref Range Status   AFB Specimen Processing Comment  Final    Comment: Tissue Grinding and Digestion/Decontamination   Acid Fast Smear Negative  Final    Comment: (NOTE) Performed At: [facility] [address] [place], [place] [id number] Jolene Schimke MD IR:[id number]    Source (AFB) TISSUE  Final    Comment: RIGHT LUNG Performed at [facility], [address] [place], [place] [address]   Fungus Culture Result     Status: None   Collection Time: 12/05/18  3:49 PM  Result Value Ref Range Status   Result 1 Comment  Final    Comment: (NOTE) KOH/Calcofluor preparation:  no fungus observed. Performed At: [facility] [address] [place], [place]  [id number] Jolene Schimke MD DU:[id number]          Radiology Studies: Dg Chest Port 1 View  Result Date: 12/09/2018 CLINICAL DATA:  Pneumothorax. EXAM: PORTABLE CHEST 1 VIEW COMPARISON:  Radiograph of December 08, 2018. FINDINGS: The heart size and mediastinal contours are within normal limits. Left lung is clear. Right-sided PICC line is unchanged in position. Stable position of right basilar chest tube. Right basilar pneumothorax noted on prior exam appears to have resolved. Minimal pleural thickening or effusion may be present. Stable right basilar opacity is noted. The visualized skeletal structures are unremarkable. IMPRESSION: Stable position of right basilar chest tube. No definite pneumothorax is noted currently. Stable right basilar minimal pleural thickening or effusion. Stable right basilar opacity. Electronically Signed   By: Lupita Raider M.D.   On: 12/09/2018 09:19   Dg Chest Port 1 View  Result Date: 12/08/2018 CLINICAL DATA:  Pleural effusion, shortness of breath. EXAM: PORTABLE CHEST 1 VIEW COMPARISON:  Radiograph December 07, 2018. FINDINGS: The heart size and mediastinal contours are within normal limits. Left lung is clear. Right-sided PICC line is unchanged in position. Stable right basilar atelectasis is noted. Stable position of right sided pleural drainage catheter. Minimal pleural effusion may be present. Stable minimal right basilar pneumothorax is noted. The visualized skeletal structures are unremarkable. IMPRESSION: Stable position of right-sided pleural drainage catheter is noted. Stable small right basilar pneumothorax is noted with associated atelectasis of the right lower lobe. Electronically Signed   By: Lupita Raider M.D.   On: 12/08/2018 09:10      Scheduled Meds: . sodium chloride   Intravenous Once  . atorvastatin  20 mg Oral QHS  . bisacodyl  10 mg Oral Daily  . doxepin  100 mg Oral QHS  . feeding supplement (ENSURE ENLIVE)  237 mL Oral TID BM  . feeding  supplement (PRO-STAT SUGAR FREE 64)  30 mL Oral BID  . gabapentin  600 mg Oral QID  . multivitamin with minerals  1 tablet Oral Daily  . nicotine  14 mg Transdermal Daily  . pantoprazole  40 mg Oral Daily  . rifampin  300 mg Oral Q12H  . senna-docusate  1 tablet Oral QHS  . sodium chloride flush  10-40 mL Intracatheter Q12H  . tiZANidine  4 mg Oral QHS   Continuous Infusions: .  ceFAZolin (ANCEF) IV 2 g (12/09/18 1500)     LOS: 7 days      Lynden Oxford, MD [facility] Pager: [email or web address] Password [email or web address] 12/09/2018, 8:50 PM

## 2018-12-09 NOTE — TOC Progression Note (Addendum)
Transition of Care (TOC) - Progression Note  Donn Pierini RN, BSN Transitions of Care Unit 4E- RN Case Manager [phone]  Patient Details  Name: CASHAWN KUCERA MRN: [id number] Date of Birth: Jan 25, 1956  Transition of Care [facility]) CM/SW Contact  Zenda Alpers Lenn Sink, RN Phone Number: 12/09/2018, 2:38 PM  Clinical Narrative:    Follow up done with pt regarding HH choice- per pt he is ok using [facility] and [facility] for his HH and IV abx needs- referral called to Lupita Leash with [facility] for [facility] needs-referral declined- CM will f/u for second choice- pt will need HHRN/PT orders placed prior to discharge along with OPAT orders for iv abx needs, pt will also need DME order for RW. Pam with [facility] will follow along for home IV abx and see pt prior to discharge for teaching.    Expected Discharge Plan: Home w Home Health Services Barriers to Discharge: Continued Medical Work up  Expected Discharge Plan and Services Expected Discharge Plan: Home w Home Health Services   Discharge Planning Services: CM Consult Post Acute Care Choice: Home Health, Durable Medical Equipment Living arrangements for the past 2 months: Apartment                 DME Arranged: Walker rolling         HH Arranged: IV Antibiotics, RN, PT HH Agency: [facility] Date HH Agency Contacted: 12/09/18 Time HH Agency Contacted: 1405 Representative spoke with at [facility] Agency: Kizzie Furnish   Social Determinants of Health (SDOH) Interventions    Readmission Risk Interventions No flowsheet data found.

## 2018-12-09 NOTE — Progress Notes (Signed)
Pt morphine PCA stopped at this time per order. 3 ml morphine wasted into stericylce witnessed by Alfredo Bach, RN. Pt educated, pt voiced understanding. Will continue to monitor.  Theophilus Kinds, RN

## 2018-12-09 NOTE — Progress Notes (Addendum)
      [address]       [place] [address]             [phone]       4 Days Post-Op Procedure(s) (LRB): RIGHT VIDEO ASSISTED THORACOSCOPY DRAIN EMPYEMA/LUNG ABSCESS (Right) Excisional Debridement Right Sacral Wound (Right)  Subjective: Patient with pain on right side (he states from when he fell prior to admission)  Objective: Vital signs in last 24 hours: Temp:  [97.7 F (36.5 C)-98.5 F (36.9 C)] 98.5 F (36.9 C) (06/05 0412) Pulse Rate:  [80-97] 80 (06/05 0412) Cardiac Rhythm: Normal sinus rhythm (06/04 2200) Resp:  [11-24] 16 (06/05 0447) BP: (125-143)/(66-82) 129/74 (06/05 0412) SpO2:  [96 %-100 %] 100 % (06/05 0412) Weight:  [54.6 kg] 54.6 kg (06/05 0411)     Intake/Output from previous day: 06/04 0701 - 06/05 0700 In: 765.2 [I.V.:465.2; IV Piggyback:300] Out: 1130 [Urine:950; Chest Tube:180]   Physical Exam:  Cardiovascular: RRR Pulmonary: Diminished breath sounds bilaterally Abdomen: Soft, non tender, bowel sounds present. Extremities: No LE edema Wounds: Dressing is clean and dry.   Chest Tube: to suction;+1-2 air leak   Lab Results: CBC: Recent Labs    12/07/18 0434 12/07/18 1630  WBC 7.0 6.2  HGB 6.8* 8.7*  HCT 23.2* 28.0*  PLT 246 284   BMET:  Recent Labs    12/07/18 0434 12/08/18 0519  NA 135 136  K 3.8 3.4*  CL 98 96*  CO2 29 32  GLUCOSE 107* 131*  BUN 8 <5*  CREATININE 0.75 0.59*  CALCIUM 8.1* 8.5*    PT/INR:  No results for input(s): LABPROT, INR in the last 72 hours. ABG:  INR: Will add last result for INR, ABG once components are confirmed Will add last 4 CBG results once components are confirmed  Assessment/Plan:  1. CV - Sinus tachycardia at times  into low 100's at times. 2.  Pulmonary - On 2 liters of oxygen via [place]. Chest tube with 180 cc since surgery. Chest tube is to suction and there continues to be a persistent a +1-2 air leak (likely had lung abscess). CXR this am appears stable.  Encourage incentive spirometer. Gram stain of pleural fluid showed gram positive cocci; culture shows rare Staph Aureus 3. ID- on Cefazolin 2 grams IV tid and Rifampin 300 mg orally bid 4. Anemia-Last H and H this am increased to 8.7 and 28 (s/p transfusion) 5. Sacral wound-s/p debridement, dressing changes per general surgery  Donielle M ZimmermanPA-C 12/09/2018,7:05 AM [phone]     Chart reviewed, patient examined, agree with above. CXR continues to improve with better aeration of RLL. Chest tube will have to remain until air leak stops.

## 2018-12-09 NOTE — Plan of Care (Signed)
  Problem: Health Behavior/Discharge Planning: Goal: Ability to manage health-related needs will improve Outcome: Progressing   Problem: Clinical Measurements: Goal: Will remain free from infection Outcome: Progressing   

## 2018-12-09 NOTE — Progress Notes (Signed)
[facility]/Trauma Progress Note  4 Days Post-Op   Assessment/Plan Principal Problem: MSSA bacteremia Active Problems: Essential hypertension Cigarette smoker Hyperlipidemia Empyema of right pleural space (HCC) Chronic pain Duodenal ulcer Decubitus ulcer Elevated INR  R Empyema - S/P VATS, Dr. Laneta Simmers, 06/01  L buttock/sacral decub -S/P excisional debridement of right sacral wound, Dr. Andrey Campanile, 06/01 - wet to dry dressing changes qshift, pink dressing to other areas - antibiotics per ID  Follow up:[facility] clinic 2-3 weeks for wound check    LOS: 7 days    Subjective: CC: buttock pain  Pain is improved. No issues overnight.   Objective: Vital signs in last 24 hours: Temp:  [97.7 F (36.5 C)-98.5 F (36.9 C)] 98.5 F (36.9 C) (06/05 0412) Pulse Rate:  [80-97] 80 (06/05 0412) Resp:  [11-24] 16 (06/05 0447) BP: (125-143)/(66-82) 129/74 (06/05 0412) SpO2:  [96 %-100 %] 100 % (06/05 0412) Weight:  [54.6 kg] 54.6 kg (06/05 0411) Last BM Date: 12/08/18  Intake/Output from previous day: 06/04 0701 - 06/05 0700 In: 765.2 [I.V.:465.2; IV Piggyback:300] Out: 1130 [Urine:950; Chest Tube:180] Intake/Output this shift: No intake/output data recorded.  PE: Gen: Alert, NAD, pleasant, cooperative Pulm:Rate andeffort normal GU: sacral wound looks clean and is without purulent drainage, improved erythema, no TTP, see photo below Skin: no rashes noted, warm and dry      Anti-infectives: Anti-infectives (From admission, onward)   Start     Dose/Rate Route Frequency Ordered Stop   12/02/18 2330  ceFAZolin (ANCEF) IVPB 2g/100 mL premix     2 g 200 mL/hr over 30 Minutes Intravenous Every 8 hours 12/02/18 2322 12/18/18 2359   12/02/18 2330  rifampin (RIFADIN) capsule 300 mg     300 mg Oral Every 12 hours 12/02/18 2322 12/18/18 2359      Lab Results:  Recent Labs    12/07/18 0434 12/07/18 1630  WBC 7.0 6.2  HGB 6.8* 8.7*   HCT 23.2* 28.0*  PLT 246 284   BMET Recent Labs    12/07/18 0434 12/08/18 0519  NA 135 136  K 3.8 3.4*  CL 98 96*  CO2 29 32  GLUCOSE 107* 131*  BUN 8 <5*  CREATININE 0.75 0.59*  CALCIUM 8.1* 8.5*   PT/INR No results for input(s): LABPROT, INR in the last 72 hours. CMP     Component Value Date/Time   NA 136 12/08/2018 0519   NA 139 09/15/2017 1040   K 3.4 (L) 12/08/2018 0519   CL 96 (L) 12/08/2018 0519   CO2 32 12/08/2018 0519   GLUCOSE 131 (H) 12/08/2018 0519   BUN <5 (L) 12/08/2018 0519   BUN 5 (L) 09/15/2017 1040   CREATININE 0.59 (L) 12/08/2018 0519   CALCIUM 8.5 (L) 12/08/2018 0519   PROT 6.2 (L) 12/08/2018 0519   ALBUMIN 1.7 (L) 12/08/2018 0519   AST 16 12/08/2018 0519   ALT 6 12/08/2018 0519   ALKPHOS 100 12/08/2018 0519   BILITOT 0.6 12/08/2018 0519   GFRNONAA >60 12/08/2018 0519   GFRAA >60 12/08/2018 0519   Lipase  No results found for: LIPASE  Studies/Results: Dg Chest Port 1 View  Result Date: 12/08/2018 CLINICAL DATA:  Pleural effusion, shortness of breath. EXAM: PORTABLE CHEST 1 VIEW COMPARISON:  Radiograph December 07, 2018. FINDINGS: The heart size and mediastinal contours are within normal limits. Left lung is clear. Right-sided PICC line is unchanged in position. Stable right basilar atelectasis is noted. Stable position of right sided pleural drainage catheter. Minimal pleural effusion  may be present. Stable minimal right basilar pneumothorax is noted. The visualized skeletal structures are unremarkable. IMPRESSION: Stable position of right-sided pleural drainage catheter is noted. Stable small right basilar pneumothorax is noted with associated atelectasis of the right lower lobe. Electronically Signed   By: Lupita RaiderJames  Green Jr M.D.   On: 12/08/2018 09:10      Jerre SimonJessica L Ozzie Knobel , [facility] 12/09/2018, 8:24 AM  Pager: [phone] Mon-Wed, Friday 7:00am-4:30pm Thurs 7am-11:30am  Consults: [phone]

## 2018-12-09 NOTE — Progress Notes (Signed)
PT Cancellation Note  Patient Details Name: Randy Page MRN: [id number] DOB: May 02, 1956   Cancelled Treatment:    Reason Eval/Treat Not Completed: Patient declined, no reason specified.  Been twice, not going again. 12/09/2018  [place] Bing, PT Acute Rehabilitation Services [phone]  (pager) [phone]  (office)   Eliseo Gum Alessandria Henken 12/09/2018, 6:04 PM

## 2018-12-10 ENCOUNTER — Inpatient Hospital Stay (HOSPITAL_COMMUNITY): Payer: Medicare Other

## 2018-12-10 LAB — AEROBIC/ANAEROBIC CULTURE W GRAM STAIN (SURGICAL/DEEP WOUND)

## 2018-12-10 MED ORDER — SILVER NITRATE-POT NITRATE 75-25 % EX MISC
1.0000 | CUTANEOUS | Status: DC | PRN
  Filled 2018-12-10: qty 1

## 2018-12-10 NOTE — Progress Notes (Addendum)
      [address]       [place] [address]             [phone]       5 Days Post-Op Procedure(s) (LRB): RIGHT VIDEO ASSISTED THORACOSCOPY DRAIN EMPYEMA/LUNG ABSCESS (Right) Excisional Debridement Right Sacral Wound (Right)  Subjective: Patient was not using PCA much so it was stopped yesterday. He states his breathing is "good".  Objective: Vital signs in last 24 hours: Temp:  [98.2 F (36.8 C)-99.6 F (37.6 C)] 98.8 F (37.1 C) (06/06 0500) Pulse Rate:  [82-93] 89 (06/06 0500) Cardiac Rhythm: Normal sinus rhythm (06/05 2100) Resp:  [16-18] 17 (06/06 0500) BP: (115-132)/(58-79) 130/68 (06/06 0500) SpO2:  [96 %-99 %] 97 % (06/06 0500) Weight:  [53.6 kg] 53.6 kg (06/06 0500)     Intake/Output from previous day: 06/05 0701 - 06/06 0700 In: 797.3 [P.O.:540; IV Piggyback:257.3] Out: 2245 [Urine:2075; Chest Tube:170]   Physical Exam:  Cardiovascular: RRR Pulmonary: Diminished breath sounds bilaterally Abdomen: Soft, non tender, bowel sounds present. Extremities: No LE edema Wounds: Dressing is clean and dry.   Chest Tube: to suction;persistent air leak   Lab Results: CBC: Recent Labs    12/07/18 1630  WBC 6.2  HGB 8.7*  HCT 28.0*  PLT 284   BMET:  Recent Labs    12/08/18 0519  NA 136  K 3.4*  CL 96*  CO2 32  GLUCOSE 131*  BUN <5*  CREATININE 0.59*  CALCIUM 8.5*    PT/INR:  No results for input(s): LABPROT, INR in the last 72 hours. ABG:  INR: Will add last result for INR, ABG once components are confirmed Will add last 4 CBG results once components are confirmed  Assessment/Plan:  1. CV - SR in the 80's this am. 2.  Pulmonary - On 2 liters of oxygen via [place]. Chest tube with 170 cc last 24 hours. Chest tube is to suction and there continues to be a persistent a air leak (likely had lung abscess). CXR this am appears stable. Chest tube to remain to suctin for now. Encourage incentive spirometer. Gram stain of pleural fluid  showed gram positive cocci; culture shows rare Staph Aureus 3. ID- on Cefazolin 2 grams IV tid and Rifampin 300 mg orally bid 4. Anemia-Last H and H increased to 8.7 and 28 (s/p transfusion) 5. Sacral wound-s/p debridement, dressing changes per general surgery  Donielle M ZimmermanPA-C 12/10/2018,7:22 AM [phone]    Patient seen and examined, agree with above Still has a moderate air leak  Remo Lipps C. Roxan Hockey, MD [facility] [phone]

## 2018-12-10 NOTE — Plan of Care (Signed)
  Problem: Clinical Measurements: Goal: Will remain free from infection Outcome: Progressing Goal: Respiratory complications will improve Outcome: Progressing   Problem: Health Behavior/Discharge Planning: Goal: Ability to manage health-related needs will improve Outcome: Not Progressing   

## 2018-12-10 NOTE — Progress Notes (Signed)
Physical Therapy Treatment Patient Details Name: Randy Page MRN: [id number] DOB: [birth year] Today's Date: 12/10/2018    History of Present Illness Randy Page 63 y.o. male with medical history significant for hypertension, hyperlipidemia, recent GI bleed due to duodenal ulcer, anemia, chronic neck pain status post cervical fusion who is transferred to [facility] hospital from [facility] for further management of right-sided empyema and MSSA bacteremia. s/p EXCISIONAL DEBRIDEMENT OF RIGHT SACRAL WOUND on 12/05/18    PT Comments    Pt needs encouragement to push himself, but agreeable.  Emphasis on gait stability/stamina .  Follow Up Recommendations  Home health PT;Supervision for mobility/OOB     Equipment Recommendations  None recommended by PT    Recommendations for Other Services       Precautions / Restrictions Precautions Precautions: Other (comment)(chest tube)    Mobility  Bed Mobility Overal bed mobility: Modified Independent                Transfers Overall transfer level: Needs assistance   Transfers: Sit to/from Stand Sit to Stand: Min guard         General transfer comment: cues for hand placement.  Pt not showing evidence of learning from cues  Ambulation/Gait Ambulation/Gait assistance: Min guard Gait Distance (Feet): 230 Feet Assistive device: Rolling walker (2 wheeled) Gait Pattern/deviations: Step-through pattern Gait velocity: slower Gait velocity interpretation: 1.31 - 2.62 ft/sec, indicative of limited community ambulator General Gait Details: Episodes of mild unsteadiness with mild drift.  VSS overall, no signs of dyspnea or fatigue, but pt with complaints of pain.   Stairs             Wheelchair Mobility    Modified Rankin (Stroke Patients Only)       Balance     Sitting balance-Leahy Scale: Good       Standing balance-Leahy Scale: Fair                              Cognition Arousal/Alertness:  Awake/alert Behavior During Therapy: WFL for tasks assessed/performed;Anxious Overall Cognitive Status: Within Functional Limits for tasks assessed                                        Exercises      General Comments General comments (skin integrity, edema, etc.): sats in the mid 90's overall.      Pertinent Vitals/Pain Pain Assessment: Faces Faces Pain Scale: Hurts little more Pain Location: chest tube site and buttocks Pain Descriptors / Indicators: Discomfort;Guarding;Grimacing Pain Intervention(s): Monitored during session;Premedicated before session    Home Living                      Prior Function            PT Goals (current goals can now be found in the care plan section) Acute Rehab PT Goals Patient Stated Goal: Get well PT Goal Formulation: With patient Time For Goal Achievement: 12/17/18 Potential to Achieve Goals: Good Progress towards PT goals: Progressing toward goals    Frequency    Min 3X/week      PT Plan Current plan remains appropriate    Co-evaluation              AM-PAC PT "6 Clicks" Mobility   Outcome Measure  Help needed turning from your back  to your side while in a flat bed without using bedrails?: None Help needed moving from lying on your back to sitting on the side of a flat bed without using bedrails?: None Help needed moving to and from a bed to a chair (including a wheelchair)?: A Little Help needed standing up from a chair using your arms (e.g., wheelchair or bedside chair)?: A Little Help needed to walk in hospital room?: A Little Help needed climbing 3-5 steps with a railing? : A Little 6 Click Score: 20    End of Session   Activity Tolerance: Patient tolerated treatment well   Nurse Communication: Mobility status PT Visit Diagnosis: Unsteadiness on feet (R26.81);Muscle weakness (generalized) (M62.81);History of falling (Z91.81);Difficulty in walking, not elsewhere classified (R26.2)      Time: [id number] PT Time Calculation (min) (ACUTE ONLY): 11 min  Charges:  $Gait Training: 8-22 mins                     12/10/2018  [place] BingKen Lacie Landry, PT Acute Rehabilitation Services [phone]  (pager) [phone]  (office)   Randy Page 12/10/2018, 5:20 PM

## 2018-12-10 NOTE — Progress Notes (Signed)
Physical Therapy Wound Treatment Patient Details  Name: Randy Page MRN: [id number] Date of Birth: 11-Nov-1955  Today's Date: 12/10/2018 Time: 3785-8850 Time Calculation (min): 37 min  Subjective  Patient and Family Stated Goals: healed up some and home to continue Prior Treatments: surgical debridement 6/1  Pain Score:  2-4/10, premedicate  Wound Assessment  Pressure Injury 12/02/18 Buttocks Right Unstageable - Full thickness tissue loss in which the base of the ulcer is covered by slough (yellow, tan, gray, green or brown) and/or eschar (tan, brown or black) in the wound bed. (Active)  Wound Image   12/10/2018  5:27 PM  Dressing Type Barrier Film (skin prep);Gauze (Comment);Moist to dry 12/10/2018  5:27 PM  Dressing Changed;Clean 12/10/2018  5:27 PM  Dressing Change Frequency Daily 12/10/2018  5:27 PM  State of Healing Eschar 12/10/2018  5:27 PM  Site / Wound Assessment Brown;Yellow 12/10/2018  5:27 PM  % Wound base Red or Granulating 5% 12/10/2018  5:27 PM  % Wound base Yellow/Fibrinous Exudate 95% 12/10/2018  5:27 PM  Peri-wound Assessment Erythema (blanchable) 12/10/2018  5:27 PM  Wound Length (cm) 9 cm 12/10/2018  5:27 PM  Wound Width (cm) 6 cm 12/10/2018  5:27 PM  Wound Depth (cm) 3 cm 12/10/2018  5:27 PM  Wound Surface Area (cm^2) 54 cm^2 12/10/2018  5:27 PM  Wound Volume (cm^3) 162 cm^3 12/10/2018  5:27 PM  Margins Unattached edges (unapproximated) 12/10/2018  5:27 PM  Drainage Amount Moderate 12/10/2018  5:27 PM  Drainage Description Serosanguineous 12/10/2018  5:27 PM  Treatment Cleansed;Debridement (Selective);Hydrotherapy (Pulse lavage);Packing (Saline gauze) 12/10/2018  5:27 PM      Hydrotherapy Pulsed lavage therapy - wound location: R buttock/sacral Pulsed Lavage with Suction (psi): 8 psi(to 12) Pulsed Lavage with Suction - Normal Saline Used: 1000 mL Pulsed Lavage Tip: Tip with splash shield Selective Debridement Selective Debridement - Location: R buttock/sacrum Selective Debridement - Tools  Used: Forceps;Scalpel;Scissors Selective Debridement - Tissue Removed: Thick yellow eschar   Wound Assessment and Plan  Wound Therapy - Assess/Plan/Recommendations Wound Therapy - Clinical Statement: This wound needs hydrotherapy for PLS to soften this thick eschar for selective debridement.  Not yet stageable and not sure if small left sites will end up a part of 1 wound.  The left sites were not addressed formally on evaluation Wound Therapy - Functional Problem List: balance,stability issues Factors Delaying/Impairing Wound Healing: Multiple medical problems;Infection - systemic/local Hydrotherapy Plan: Debridement;Dressing change;Patient/family education;Pulsatile lavage with suction Wound Therapy - Frequency: 6X / week Wound Therapy - Current Recommendations: PT Wound Therapy - Follow Up Recommendations: Home health RN Wound Plan: see above  Wound Therapy Goals- Improve the function of patient's integumentary system by progressing the wound(s) through the phases of wound healing (inflammation - proliferation - remodeling) by: Decrease Necrotic Tissue to: 25% Decrease Necrotic Tissue - Progress: Goal set today Increase Granulation Tissue to: 75% Increase Granulation Tissue - Progress: Goal set today Goals/treatment plan/discharge plan were made with and agreed upon by patient/family: Yes Time For Goal Achievement: 7 days Wound Therapy - Potential for Goals: Good  Goals will be updated until maximal potential achieved or discharge criteria met.  Discharge criteria: when goals achieved, discharge from hospital, MD decision/surgical intervention, no progress towards goals, refusal/missing three consecutive treatments without notification or medical reason.  GP     Tessie Fass Lynne Takemoto 12/10/2018, 5:39 PM 12/10/2018  Donnella Sham, PT Acute Rehabilitation Services [phone]  (pager) [phone]  (office)

## 2018-12-10 NOTE — Progress Notes (Signed)
PROGRESS NOTE    Randy Page   [id number]  DOB: 22-Aug-1955  DOA: 12/02/2018 PCP: Karle PlumberArvind, Moogali M, MD   Brief Narrative:  Randy Page 63 y.o. male with medical history significant for hypertension, hyperlipidemia, recent GI bleed due to duodenal ulcer, anemia, chronic neck pain status post cervical fusion who is transferred to [facility] from [facility] for further management of right-sided empyema and MSSA bacteremia. He presented to [name] after a fall at home. He was found to be hypoglycemia and hypotensive (SBP 80s) by EMS.  Lactic acid was 12.3 CT chest > large air and fluid collection in right hemithorax favored to represent a large empyema, complete collapse of RML and near compete collapse of RLL. Tree in bud opacities in RUL and ELL. Emphysematous changes.   He was previously admitted to [facility] from 10/31/18- 11/03/18 for GI bleed found to be due to a duodenal ulcer.    Subjective: Some chest pain.  No nausea no vomiting.  Breathing stable.  Assessment & Plan:   Principal Problem:   MSSA bacteremia, HCAP,  empyema, acute hypoxic respiratory failure - underwent chest tube on 5/27 - plan at [name] for 3 wks of Cefazolin and Rifampin - I have asked for an ID consult as well  -   repeat blood cultures negative  - repeat pleural fluid cultures pending - CXR 5/30> no significant change in residual pleural and parenchymal disease at the right base - transferred to [facility] for CT surgery to evaluate - 6/1- underwent uncompliacated VATS by Dr Laneta SimmersBartle cultures are growing staph aureus pansensitive. - wean O2 as able - ID recommend PICC and 2 wks of Cefazolin and Rifampin after VATS- Have PICC.  Last day of antibiotic 12/18/2018.  Active Problems:   Hypotension with h/o Essential hypertension - holding antihypertensives - 5/31>  PT note mentions orthostatic hypotension BP drop to 90/65 from 132/61 and he became lightheaded after 45 sec.  - despite  fluid replacement, orthostatics continue to be + - TEDS ordered by he declined them- I have explained the rational for orthostatic hypotension and he is in agreement - Cortisol level 9.7 is a little low for his level of illness- -  Cosyntropin test ordered and administered today but ACTH not drawn by lab - recheck orthostatics    Decubitus ulcer b/l but much larger on right buttock- present on admission - followed by wound care clinic as outpt    - consulted general surgery - underwent debridement after VATS in OR - added air mattress  Hypoalbuminemia, underweight Body mass index is 17.44 kg/m. - in context of current illness - have added Ensure and requested an nutrition eval- recommendations> TID Ensure  Elevated INR -  cont vit K x 3 days  - INR is noted to be improved    Hyperlipidemia - cont Atorvastatin  Hypokalemia -replaced      Chronic pain in neck and back - s/p cervical fusion - cont Oxy IR 15 mg Q4 hrs PRN and Gabapentin    Duodenal ulcer with GI bleed s/p clipping 4/20 - cont Protonix  Anemia- normocytic -  Hb 7.4 -8.5 range -  no prior Hb to compare with -  anemia panel consistent with AOCD  Nicotine abuse - counseled to quit- he states he would like to but his girlfriend also smokes - cont Nicotine patch  Hyperthyroid -cont Tapazol  Deconditioning/ fall on admission - PT eval ordered- recommending HHPT  Time spent in minutes: 35  min DVT prophylaxis: SCDs Code Status: Full code Family Communication:  Disposition Plan: HHPT recommended Consultants:   CT surgery  General surgery Procedures:   Chest tube  VATS  I and D of decubitus Antimicrobials:  Anti-infectives (From admission, onward)   Start     Dose/Rate Route Frequency Ordered Stop   12/02/18 2330  ceFAZolin (ANCEF) IVPB 2g/100 mL premix     2 g 200 mL/hr over 30 Minutes Intravenous Every 8 hours 12/02/18 2322 12/18/18 2359   12/02/18 2330  rifampin (RIFADIN) capsule 300 mg      300 mg Oral Every 12 hours 12/02/18 2322 12/18/18 2359       Objective: Vitals:   12/10/18 0500 12/10/18 0845 12/10/18 1222 12/10/18 1624  BP: 130/68 (!) 110/56 122/69 133/71  Pulse: 89 (!) 108 93 91  Resp: Temp: 98.8 F (37.1 C) 99.7 F (37.6 C) 99.1 F (37.3 C) 98.2 F (36.8 C)  TempSrc: Oral Oral Oral Oral  SpO2: 97% 99% 99% 100%  Weight: 53.6 kg     Height:        Intake/Output Summary (Last 24 hours) at 12/10/2018 1909 Last data filed at 12/10/2018 1733 Gross per 24 hour  Intake 522 ml  Output 855 ml  Net -333 ml   Filed Weights   12/08/18 0554 12/09/18 0411 12/10/18 0500  Weight: 56.2 kg 54.6 kg 53.6 kg    Examination:  General exam: Appears comfortable  HEENT: PERRLA, oral mucosa moist, no sclera icterus or thrush Respiratory system: Crackles in RLL, bloody fluid in chest tube- Respiratory effort normal. Cardiovascular system: S1 & S2 heard,  No murmurs - tachycardic  Gastrointestinal system: Abdomen soft, non-tender, nondistended. Normal bowel sounds   Central nervous system: Alert and oriented. No focal neurological deficits. Extremities: No cyanosis, clubbing or edema Skin: dressing just changed by surgery and thus I did no re-open it today Psychiatry:  Mood & affect appropriate.  Psychiatry:  Mood & affect appropriate.   Data Reviewed: I have personally reviewed following labs and imaging studies  CBC: Recent Labs  Lab 12/04/18 1054 12/05/18 0435 12/06/18 0805 12/07/18 0434 12/07/18 1630  WBC 4.1 5.8 6.0 7.0 6.2  HGB 8.5* 8.5* 8.1* 6.8* 8.7*  HCT 28.3* 28.0* 27.6* 23.2* 28.0*  MCV 96.3 95.2 97.2 98.7 94.0  PLT 239 248 273 246 284   Basic Metabolic Panel: Recent Labs  Lab 12/05/18 0435 12/06/18 0805 12/07/18 0434 12/08/18 0519  NA 131* 136 135 136  K 4.3 5.0 3.8 3.4*  CL 99 102 98 96*  CO2 32  GLUCOSE 81 128* 107* 131*  BUN 6* 8 8 <5*  CREATININE 0.57* 0.74 0.75 0.59*  CALCIUM 8.3* 8.2* 8.1* 8.5*   GFR:  Estimated Creatinine Clearance: 71.7 mL/min (A) (by C-G formula based on SCr of 0.59 mg/dL (L)). Liver Function Tests: Recent Labs  Lab 12/08/18 0519  AST 16  ALT 6  ALKPHOS 100  BILITOT 0.6  PROT 6.2*  ALBUMIN 1.7*   No results for input(s): LIPASE, AMYLASE in the last 168 hours. No results for input(s): AMMONIA in the last 168 hours. Coagulation Profile: Recent Labs  Lab 12/04/18 0342 12/05/18 0435  INR 1.4* 1.2   Cardiac Enzymes: No results for input(s): CKTOTAL, CKMB, CKMBINDEX, TROPONINI in the last 168 hours. BNP (last 3 results) No results for input(s): PROBNP in the last 8760 hours. HbA1C: No results for input(s): HGBA1C in the last 72 hours. CBG: Recent Labs  Lab 12/05/18 2152 12/06/18 0638 12/06/18 1107 12/06/18 1648 12/07/18 1234  GLUCAP 184* 130* 217* 107* 98   Lipid Profile: No results for input(s): CHOL, HDL, LDLCALC, TRIG, CHOLHDL, LDLDIRECT in the last 72 hours. Thyroid Function Tests: No results for input(s): TSH, T4TOTAL, FREET4, T3FREE, THYROIDAB in the last 72 hours. Anemia Panel: No results for input(s): VITAMINB12, FOLATE, FERRITIN, TIBC, IRON, RETICCTPCT in the last 72 hours. Urine analysis: No results found for: COLORURINE, APPEARANCEUR, LABSPEC, PHURINE, GLUCOSEU, HGBUR, BILIRUBINUR, KETONESUR, PROTEINUR, UROBILINOGEN, NITRITE, LEUKOCYTESUR Sepsis Labs: @LABRCNTIP (procalcitonin:4,lacticidven:4) ) Recent Results (from the past 240 hour(s))  Culture, blood (Routine X 2) w Reflex to ID Panel     Status: None   Collection Time: 12/02/18 11:41 PM  Result Value Ref Range Status   Specimen Description BLOOD RIGHT HAND  Final   Special Requests   Final    BOTTLES DRAWN AEROBIC AND ANAEROBIC Blood Culture adequate volume   Culture   Final    NO GROWTH 5 DAYS Performed at [facility], [address] [place], [place] [address]    Report Status 12/08/2018 FINAL  Final  Culture, blood (Routine X 2) w Reflex to ID Panel     Status: None    Collection Time: 12/02/18 11:41 PM  Result Value Ref Range Status   Specimen Description BLOOD RIGHT HAND  Final   Special Requests   Final    BOTTLES DRAWN AEROBIC AND ANAEROBIC Blood Culture results may not be optimal due to an excessive volume of blood received in culture bottles   Culture   Final    NO GROWTH 5 DAYS Performed at [facility], [address] [place], [place] [address]    Report Status 12/08/2018 FINAL  Final  SARS Coronavirus 2 (CEPHEID - Performed in [facility] hospital lab), Hosp Order     Status: None   Collection Time: 12/03/18  1:40 AM  Result Value Ref Range Status   SARS Coronavirus 2 NEGATIVE NEGATIVE Final    Comment: (NOTE) If result is NEGATIVE SARS-CoV-2 target nucleic acids are NOT DETECTED. The SARS-CoV-2 RNA is generally detectable in upper and lower  respiratory specimens during the acute phase of infection. The lowest  concentration of SARS-CoV-2 viral copies this assay can detect is 250  copies / mL. A negative result does not preclude SARS-CoV-2 infection  and should not be used as the sole basis for treatment or other  patient management decisions.  A negative result may occur with  improper specimen collection / handling, submission of specimen other  than nasopharyngeal swab, presence of viral mutation(s) within the  areas targeted by this assay, and inadequate number of viral copies  (<250 copies / mL). A negative result must be combined with clinical  observations, patient history, and epidemiological information. If result is POSITIVE SARS-CoV-2 target nucleic acids are DETECTED. The SARS-CoV-2 RNA is generally detectable in upper and lower  respiratory specimens dur ing the acute phase of infection.  Positive  results are indicative of active infection with SARS-CoV-2.  Clinical  correlation with patient history and other diagnostic information is  necessary to determine patient infection status.  Positive results do  not  rule out bacterial infection or co-infection with other viruses. If result is PRESUMPTIVE POSTIVE SARS-CoV-2 nucleic acids MAY BE PRESENT.   A presumptive positive result was obtained on the submitted specimen  and confirmed on repeat testing.  While 2019 novel coronavirus  (SARS-CoV-2) nucleic acids may be present in the  submitted sample  additional confirmatory testing may be necessary for epidemiological  and / or clinical management purposes  to differentiate between  SARS-CoV-2 and other Sarbecovirus currently known to infect humans.  If clinically indicated additional testing with an alternate test  methodology [id number]) is advised. The SARS-CoV-2 RNA is generally  detectable in upper and lower respiratory sp ecimens during the acute  phase of infection. The expected result is Negative. Fact Sheet for Patients:  StrictlyIdeas.no Fact Sheet for Healthcare Providers: BankingDealers.co.za This test is not yet approved or cleared by the [place] FDA and has been authorized for detection and/or diagnosis of SARS-CoV-2 by FDA under an Emergency Use Authorization (EUA).  This EUA will remain in effect (meaning this test can be used) for the duration of the COVID-19 declaration under Section 564(b)(1) of the Act, 21 U.S.C. section 360bbb-3(b)(1), unless the authorization is terminated or revoked sooner. Performed at [facility], [place] [address], [place], [place] [address]   MRSA PCR Screening     Status: None   Collection Time: 12/04/18  8:33 PM  Result Value Ref Range Status   MRSA by PCR NEGATIVE NEGATIVE Final    Comment:        The GeneXpert MRSA Assay (FDA approved for NASAL specimens only), is one component of a comprehensive MRSA colonization surveillance program. It is not intended to diagnose MRSA infection nor to guide or monitor treatment for MRSA infections. Performed at [facility], [place] [address], [place], [place] [address]   Aerobic/Anaerobic Culture (surgical/deep wound)     Status: None   Collection Time: 12/05/18  3:36 PM  Result Value Ref Range Status   Specimen Description FLUID RIGHT LUNG  Final   Special Requests PATIENT ON FOLLOWING ANCEF  Final   Gram Stain   Final    ABUNDANT WBC PRESENT, PREDOMINANTLY PMN FEW GRAM POSITIVE COCCI    Culture   Final    RARE STAPHYLOCOCCUS AUREUS NO ANAEROBES ISOLATED Performed at [facility], [place] [address], [place], [place] [address]    Report Status 12/10/2018 FINAL  Final   Organism ID, Bacteria STAPHYLOCOCCUS AUREUS  Final      Susceptibility   Staphylococcus aureus - MIC*    CIPROFLOXACIN <=0.5 SENSITIVE Sensitive     ERYTHROMYCIN <=0.25 SENSITIVE Sensitive     GENTAMICIN <=0.5 SENSITIVE Sensitive     OXACILLIN <=0.25 SENSITIVE Sensitive     TETRACYCLINE <=1 SENSITIVE Sensitive     VANCOMYCIN 1 SENSITIVE Sensitive     TRIMETH/SULFA <=10 SENSITIVE Sensitive     CLINDAMYCIN <=0.25 SENSITIVE Sensitive     RIFAMPIN <=0.5 SENSITIVE Sensitive     Inducible Clindamycin NEGATIVE Sensitive     * RARE STAPHYLOCOCCUS AUREUS  Fungus Culture With Stain     Status: None (Preliminary result)   Collection Time: 12/05/18  3:49 PM  Result Value Ref Range Status   Fungus Stain Final report  Final    Comment: (NOTE) Performed At: [facility] [address] [place], [place] [id number] Rush Farmer MD [id number]    Fungus (Mycology) Culture PENDING  Incomplete   Fungal Source TISSUE  Final    Comment: RIGHT LUNG Performed at [facility], [place] [address], [place],  [address]   Aerobic/Anaerobic Culture (surgical/deep wound)     Status: None   Collection Time: 12/05/18  3:49 PM  Result Value Ref Range Status   Specimen Description TISSUE RIGHT LUNG  Final   Special  Requests PATIENT ON FOLLOWING ANCEF  Final   Gram Stain   Final    ABUNDANT WBC PRESENT, PREDOMINANTLY PMN ABUNDANT GRAM POSITIVE COCCI     Culture   Final    RARE STAPHYLOCOCCUS AUREUS NO ANAEROBES ISOLATED Performed at [facility], [address] [place], [place] [address]    Report Status 12/10/2018 FINAL  Final   Organism ID, Bacteria STAPHYLOCOCCUS AUREUS  Final      Susceptibility   Staphylococcus aureus - MIC*    CIPROFLOXACIN <=0.5 SENSITIVE Sensitive     ERYTHROMYCIN <=0.25 SENSITIVE Sensitive     GENTAMICIN <=0.5 SENSITIVE Sensitive     OXACILLIN <=0.25 SENSITIVE Sensitive     TETRACYCLINE <=1 SENSITIVE Sensitive     VANCOMYCIN <=0.5 SENSITIVE Sensitive     TRIMETH/SULFA <=10 SENSITIVE Sensitive     CLINDAMYCIN <=0.25 SENSITIVE Sensitive     RIFAMPIN <=0.5 SENSITIVE Sensitive     Inducible Clindamycin NEGATIVE Sensitive     * RARE STAPHYLOCOCCUS AUREUS  Acid Fast Smear (AFB)     Status: None   Collection Time: 12/05/18  3:49 PM  Result Value Ref Range Status   AFB Specimen Processing Comment  Final    Comment: Tissue Grinding and Digestion/Decontamination   Acid Fast Smear Negative  Final    Comment: (NOTE) Performed At: [facility] [address] [place], [place] [id number] Jolene SchimkeNagendra Sanjai MD XB:[id number]:[phone]    Source (AFB) TISSUE  Final    Comment: RIGHT LUNG Performed at [facility], [address] [place], [place] [address]   Fungus Culture Result     Status: None   Collection Time: 12/05/18  3:49 PM  Result Value Ref Range Status   Result 1 Comment  Final    Comment: (NOTE) KOH/Calcofluor preparation:  no fungus observed. Performed At: [facility] [address] [place], [place] [id number] Jolene SchimkeNagendra Sanjai MD MW:[id number]:[phone]          Radiology Studies: Dg Chest Port 1 View  Result Date: 12/10/2018 CLINICAL DATA:  Pleural effusion. EXAM: PORTABLE CHEST 1 VIEW COMPARISON:  Chest radiograph 12/09/2018 FINDINGS: Monitoring leads overlie the patient. Right upper extremity PICC line is present with tip projecting over the superior vena cava. Stable tortuosity of  the thoracic aorta. Right chest tube remains in position. Similar-appearing patchy opacities right lower hemithorax. Small right pleural effusion. No definite pneumothorax. Suspected skin fold left lateral hemithorax. IMPRESSION: Right chest tube remains in position. Similar-appearing opacities right lower hemithorax with small right pleural effusion. No definite right-sided pneumothorax. Suspect skin fold projecting over the lateral left hemithorax. Recommend attention on short-term follow-up chest radiograph. Electronically Signed   By: Annia Beltrew  Davis M.D.   On: 12/10/2018 08:34   Dg Chest Port 1 View  Result Date: 12/09/2018 CLINICAL DATA:  Pneumothorax. EXAM: PORTABLE CHEST 1 VIEW COMPARISON:  Radiograph of December 08, 2018. FINDINGS: The heart size and mediastinal contours are within normal limits. Left lung is clear. Right-sided PICC line is unchanged in position. Stable position of right basilar chest tube. Right basilar pneumothorax noted on prior exam appears to have resolved. Minimal pleural thickening or effusion may be present. Stable right basilar opacity is noted. The visualized skeletal structures are unremarkable. IMPRESSION: Stable position of right basilar chest tube. No definite pneumothorax is noted currently. Stable right basilar minimal pleural thickening or effusion. Stable right basilar opacity. Electronically Signed   By: Lupita RaiderJames  Green Jr M.D.   On: 12/09/2018 09:19      Scheduled  Meds: . sodium chloride   Intravenous Once  . atorvastatin  20 mg Oral QHS  . bisacodyl  10 mg Oral Daily  . doxepin  100 mg Oral QHS  . feeding supplement (ENSURE ENLIVE)  237 mL Oral TID BM  . feeding supplement (PRO-STAT SUGAR FREE 64)  30 mL Oral BID  . gabapentin  600 mg Oral QID  . multivitamin with minerals  1 tablet Oral Daily  . nicotine  14 mg Transdermal Daily  . pantoprazole  40 mg Oral Daily  . rifampin  300 mg Oral Q12H  . senna-docusate  1 tablet Oral QHS  . sodium chloride flush  10-40  mL Intracatheter Q12H  . tiZANidine  4 mg Oral QHS   Continuous Infusions: .  ceFAZolin (ANCEF) IV 2 g (12/10/18 1527)     LOS: 8 days      Lynden Oxford, MD [facility] Pager: [email or web address] Password [email or web address] 12/10/2018, 7:09 PM

## 2018-12-11 ENCOUNTER — Inpatient Hospital Stay (HOSPITAL_COMMUNITY): Payer: Medicare Other

## 2018-12-11 LAB — CBC WITH DIFFERENTIAL/PLATELET
Abs Immature Granulocytes: 0.03 10*3/uL (ref 0.00–0.07)
Basophils Absolute: 0.1 10*3/uL (ref 0.0–0.1)
Basophils Relative: 1 %
Eosinophils Absolute: 0.1 10*3/uL (ref 0.0–0.5)
Eosinophils Relative: 2 %
HCT: 27.9 % — ABNORMAL LOW (ref 39.0–52.0)
Hemoglobin: 8.6 g/dL — ABNORMAL LOW (ref 13.0–17.0)
Immature Granulocytes: 0 %
Lymphocytes Relative: 23 %
Lymphs Abs: 1.7 10*3/uL (ref 0.7–4.0)
MCH: 30.2 pg (ref 26.0–34.0)
MCHC: 30.8 g/dL (ref 30.0–36.0)
MCV: 97.9 fL (ref 80.0–100.0)
Monocytes Absolute: 0.7 10*3/uL (ref 0.1–1.0)
Monocytes Relative: 10 %
Neutro Abs: 5 10*3/uL (ref 1.7–7.7)
Neutrophils Relative %: 64 %
Platelets: 328 10*3/uL (ref 150–400)
RBC: 2.85 MIL/uL — ABNORMAL LOW (ref 4.22–5.81)
RDW: 17.5 % — ABNORMAL HIGH (ref 11.5–15.5)
WBC: 7.7 10*3/uL (ref 4.0–10.5)
nRBC: 0 % (ref 0.0–0.2)

## 2018-12-11 LAB — COMPREHENSIVE METABOLIC PANEL
ALT: 6 U/L (ref 0–44)
AST: 18 U/L (ref 15–41)
Albumin: 1.8 g/dL — ABNORMAL LOW (ref 3.5–5.0)
Alkaline Phosphatase: 101 U/L (ref 38–126)
Anion gap: 9 (ref 5–15)
BUN: 7 mg/dL — ABNORMAL LOW (ref 8–23)
CO2: 29 mmol/L (ref 22–32)
Calcium: 8.4 mg/dL — ABNORMAL LOW (ref 8.9–10.3)
Chloride: 100 mmol/L (ref 98–111)
Creatinine, Ser: 0.68 mg/dL (ref 0.61–1.24)
GFR calc Af Amer: >60 mL/min (ref >60)
GFR calc non Af Amer: >60 mL/min (ref >60)
Glucose, Bld: 123 mg/dL — ABNORMAL HIGH (ref 70–99)
Potassium: 3.5 mmol/L (ref 3.5–5.1)
Sodium: 138 mmol/L (ref 135–145)
Total Bilirubin: 0.5 mg/dL (ref 0.3–1.2)
Total Protein: 6.2 g/dL — ABNORMAL LOW (ref 6.5–8.1)

## 2018-12-11 MED ORDER — POTASSIUM CHLORIDE CRYS ER 20 MEQ PO TBCR
40.0000 meq | EXTENDED_RELEASE_TABLET | Freq: Once | ORAL | Status: AC
  Administered 2018-12-11: 40 meq via ORAL
  Filled 2018-12-11: qty 2

## 2018-12-11 MED ORDER — DOCUSATE SODIUM 100 MG PO CAPS
100.0000 mg | ORAL_CAPSULE | Freq: Two times a day (BID) | ORAL | Status: DC | PRN
  Administered 2018-12-11 – 2018-12-13 (×2): 100 mg via ORAL
  Filled 2018-12-11 (×2): qty 1

## 2018-12-11 MED ORDER — PANTOPRAZOLE SODIUM 40 MG PO TBEC
40.0000 mg | DELAYED_RELEASE_TABLET | Freq: Two times a day (BID) | ORAL | Status: DC
  Administered 2018-12-11 – 2018-12-14 (×6): 40 mg via ORAL
  Filled 2018-12-11 (×6): qty 1

## 2018-12-11 NOTE — Progress Notes (Addendum)
      [address]       [place] [address]             [phone]       6 Days Post-Op Procedure(s) (LRB): RIGHT VIDEO ASSISTED THORACOSCOPY DRAIN EMPYEMA/LUNG ABSCESS (Right) Excisional Debridement Right Sacral Wound (Right)  Subjective: Patient eating breakfast and wants medications  Objective: Vital signs in last 24 hours: Temp:  [98.2 F (36.8 C)-99.7 F (37.6 C)] 99.1 F (37.3 C) (06/07 0600) Pulse Rate:  [84-108] 84 (06/07 0600) Cardiac Rhythm: Normal sinus rhythm (06/06 1900) Resp:  [17-20] 20 (06/07 0600) BP: (110-133)/(56-76) 131/70 (06/07 0600) SpO2:  [98 %-100 %] 98 % (06/07 0600) Weight:  [54.4 kg] 54.4 kg (06/07 0600)     Intake/Output from previous day: 06/06 0701 - 06/07 0700 In: 76 [P.O.:422] Out: 420 [Urine:300; Chest Tube:120]   Physical Exam:  Cardiovascular: RRR Pulmonary: Diminished breath sounds bilaterally Abdomen: Soft, non tender, bowel sounds present. Extremities: No LE edema Wounds: Dressing is clean and dry.   Chest Tube: to suction;persistent 3+ air leak   Lab Results: CBC: Recent Labs    12/11/18 0522  WBC 7.7  HGB 8.6*  HCT 27.9*  PLT 328   BMET:  Recent Labs    12/11/18 0522  NA 138  K 3.5  CL 100  CO2 29  GLUCOSE 123*  BUN 7*  CREATININE 0.68  CALCIUM 8.4*    PT/INR:  No results for input(s): LABPROT, INR in the last 72 hours. ABG:  INR: Will add last result for INR, ABG once components are confirmed Will add last 4 CBG results once components are confirmed  Assessment/Plan:  1. CV - SR in the 80-90's. 2.  Pulmonary - On 2 liters of oxygen via [place]. Chest tube with 170 cc last 24 hours. Chest tube is to suction and there continues to be a persistent +3 air leak (likely had lung abscess). CXR this am appears stable. Chest tube to remain to suction for now. Encourage incentive spirometer. Gram stain of pleural fluid showed gram positive cocci; culture shows rare Staph Aureus 3. ID- on  Cefazolin 2 grams IV tid and Rifampin 300 mg orally bid 4. Anemia- H and H this am stable at 8.6 and 27.9 (previous transfusion) 5. Sacral wound-s/p debridement, dressing changes per general surgery 6. Supplement potassium 7. Deconditioning-continue with PT  Donielle M ZimmermanPA-C 12/11/2018,7:14 AM [phone]  C/o pain at sacrum, wants to go home He still has a large air leak CXR still shows a space at right base  Remo Lipps C. Roxan Hockey, MD [facility] [phone]

## 2018-12-11 NOTE — Progress Notes (Signed)
PROGRESS NOTE    EZIO WIECK   MIW:[id number]  DOB: Feb 19, 1956  DOA: 12/02/2018 PCP: [name], MD   Brief Narrative:  Debbe Mounts 63 y.o. male with medical history significant for hypertension, hyperlipidemia, recent GI bleed due to duodenal ulcer, anemia, chronic neck pain status post cervical fusion who is transferred to [facility] hospital from [facility] for further management of right-sided empyema and MSSA bacteremia. He presented to [name] after a fall at home. He was found to be hypoglycemia and hypotensive (SBP 80s) by EMS.  Lactic acid was 12.3 CT chest > large air and fluid collection in right hemithorax favored to represent a large empyema, complete collapse of RML and near compete collapse of RLL. Tree in bud opacities in RUL and ELL. Emphysematous changes.   He was previously admitted to [facility] from 10/31/18- 11/03/18 for GI bleed found to be due to a duodenal ulcer.    Subjective: Reports constipation.  No nausea no vomiting no fever no chills.  Assessment & Plan:   Principal Problem:   MSSA bacteremia, HCAP,  empyema, acute hypoxic respiratory failure - underwent chest tube on 5/27 - plan at [name] for 3 wks of Cefazolin and Rifampin - I have asked for an ID consult as well  -   repeat blood cultures negative  - repeat pleural fluid cultures pending - CXR 5/30> no significant change in residual pleural and parenchymal disease at the right base - transferred to [facility] for CT surgery to evaluate - 6/1- underwent uncompliacated VATS by Dr Cyndia Bent cultures are growing staph aureus pansensitive. - wean O2 as able - ID recommend PICC and 2 wks of Cefazolin and Rifampin after VATS- Have PICC.  Last day of antibiotic 12/18/2018.  Active Problems:   Hypotension with h/o Essential hypertension - holding antihypertensives - 5/31>  PT note mentions orthostatic hypotension BP drop to 90/65 from 132/61 and he became lightheaded after 45 sec.  -  despite fluid replacement, orthostatics continue to be + - TEDS ordered by he declined them- I have explained the rational for orthostatic hypotension and he is in agreement - Cortisol level 9.7 is a little low for his level of illness- -  Cosyntropin test ordered and administered today but ACTH not drawn by lab - recheck orthostatics    Decubitus ulcer b/l but much larger on right buttock- present on admission - followed by wound care clinic as outpt    - consulted general surgery - underwent debridement after VATS in OR - added air mattress  Hypoalbuminemia, underweight Body mass index is 17.72 kg/m. - in context of current illness - have added Ensure and requested an nutrition eval- recommendations> TID Ensure  Elevated INR -  cont vit K x 3 days  - INR is noted to be improved    Hyperlipidemia - cont Atorvastatin  Hypokalemia -replaced      Chronic pain in neck and back - s/p cervical fusion - cont Oxy IR 15 mg Q4 hrs PRN and Gabapentin    Duodenal ulcer with GI bleed s/p clipping 4/20 - cont Protonix  Anemia- normocytic -  Hb 7.4 -8.5 range -  no prior Hb to compare with -  anemia panel consistent with AOCD  Nicotine abuse - counseled to quit- he states he would like to but his girlfriend also smokes - cont Nicotine patch  Hyperthyroid -cont Tapazol  Deconditioning/ fall on admission - PT eval ordered- recommending HHPT  Time spent in minutes: 35  min DVT prophylaxis: SCDs Code Status: Full code Family Communication:  Disposition Plan: HHPT recommended Consultants:   CT surgery  General surgery Procedures:   Chest tube  VATS  I and D of decubitus Antimicrobials:  Anti-infectives (From admission, onward)   Start     Dose/Rate Route Frequency Ordered Stop   12/02/18 2330  ceFAZolin (ANCEF) IVPB 2g/100 mL premix     2 g 200 mL/hr over 30 Minutes Intravenous Every 8 hours 12/02/18 2322 12/18/18 2359   12/02/18 2330  rifampin (RIFADIN) capsule  300 mg     300 mg Oral Every 12 hours 12/02/18 2322 12/18/18 2359       Objective: Vitals:   12/11/18 0600 12/11/18 0907 12/11/18 1234 12/11/18 1700  BP: 131/70 131/69 140/78 (!) 124/97  Pulse: 84 89  (!) 58  Resp: 20 18  17   Temp: 99.1 F (37.3 C) 97.8 F (36.6 C) 98.4 F (36.9 C) 98.3 F (36.8 C)  TempSrc: Oral Oral Oral Oral  SpO2: 98% 100%  100%  Weight: 54.4 kg     Height:        Intake/Output Summary (Last 24 hours) at 12/11/2018 2004 Last data filed at 12/11/2018 1700 Gross per 24 hour  Intake 440 ml  Output 300 ml  Net 140 ml   Filed Weights   12/09/18 0411 12/10/18 0500 12/11/18 0600  Weight: 54.6 kg 53.6 kg 54.4 kg    Examination:  General exam: Appears comfortable  HEENT: PERRLA, oral mucosa moist, no sclera icterus or thrush Respiratory system: Crackles in RLL, bloody fluid in chest tube- Respiratory effort normal. Cardiovascular system: S1 & S2 heard,  No murmurs - tachycardic  Gastrointestinal system: Abdomen soft, non-tender, nondistended. Normal bowel sounds   Central nervous system: Alert and oriented. No focal neurological deficits. Extremities: No cyanosis, clubbing or edema Skin: dressing just changed by surgery and thus I did no re-open it today Psychiatry:  Mood & affect appropriate.  Psychiatry:  Mood & affect appropriate.   Data Reviewed: I have personally reviewed following labs and imaging studies  CBC: Recent Labs  Lab 12/05/18 0435 12/06/18 0805 12/07/18 0434 12/07/18 1630 12/11/18 0522  WBC 5.8 6.0 7.0 6.2 7.7  NEUTROABS  --   --   --   --  5.0  HGB 8.5* 8.1* 6.8* 8.7* 8.6*  HCT 28.0* 27.6* 23.2* 28.0* 27.9*  MCV 95.2 97.2 98.7 94.0 97.9  PLT 248 273 246 284 328   Basic Metabolic Panel: Recent Labs  Lab 12/05/18 0435 12/06/18 0805 12/07/18 0434 12/08/18 0519 12/11/18 0522  NA 131* 136 135 136 138  K 4.3 5.0 3.8 3.4* 3.5  CL 99 102 98 96* 100  CO2 25 28 29  32 29  GLUCOSE 81 128* 107* 131* 123*  BUN 6* 8 8 <5* 7*   CREATININE 0.57* 0.74 0.75 0.59* 0.68  CALCIUM 8.3* 8.2* 8.1* 8.5* 8.4*   GFR: Estimated Creatinine Clearance: 72.7 mL/min (by C-G formula based on SCr of 0.68 mg/dL). Liver Function Tests: Recent Labs  Lab 12/08/18 0519 12/11/18 0522  AST 16 18  ALT 6 6  ALKPHOS 100 101  BILITOT 0.6 0.5  PROT 6.2* 6.2*  ALBUMIN 1.7* 1.8*   No results for input(s): LIPASE, AMYLASE in the last 168 hours. No results for input(s): AMMONIA in the last 168 hours. Coagulation Profile: Recent Labs  Lab 12/05/18 0435  INR 1.2   Cardiac Enzymes: No results for input(s): CKTOTAL, CKMB, CKMBINDEX, TROPONINI in the last 168 hours.  BNP (last 3 results) No results for input(s): PROBNP in the last 8760 hours. HbA1C: No results for input(s): HGBA1C in the last 72 hours. CBG: Recent Labs  Lab 12/05/18 2152 12/06/18 0638 12/06/18 1107 12/06/18 1648 12/07/18 1234  GLUCAP 184* 130* 217* 107* 98   Lipid Profile: No results for input(s): CHOL, HDL, LDLCALC, TRIG, CHOLHDL, LDLDIRECT in the last 72 hours. Thyroid Function Tests: No results for input(s): TSH, T4TOTAL, FREET4, T3FREE, THYROIDAB in the last 72 hours. Anemia Panel: No results for input(s): VITAMINB12, FOLATE, FERRITIN, TIBC, IRON, RETICCTPCT in the last 72 hours. Urine analysis: No results found for: COLORURINE, APPEARANCEUR, LABSPEC, PHURINE, GLUCOSEU, HGBUR, BILIRUBINUR, KETONESUR, PROTEINUR, UROBILINOGEN, NITRITE, LEUKOCYTESUR Sepsis Labs: (procalcitonin:4,lacticidven:4) ) Recent Results (from the past 240 hour(s))  Culture, blood (Routine X 2) w Reflex to ID Panel     Status: None   Collection Time: 12/02/18 11:41 PM  Result Value Ref Range Status   Specimen Description BLOOD RIGHT HAND  Final   Special Requests   Final    BOTTLES DRAWN AEROBIC AND ANAEROBIC Blood Culture adequate volume   Culture   Final    NO GROWTH 5 DAYS Performed at [facility], [address], [place], [place] [address]    Report Status  12/08/2018 FINAL  Final  Culture, blood (Routine X 2) w Reflex to ID Panel     Status: None   Collection Time: 12/02/18 11:41 PM  Result Value Ref Range Status   Specimen Description BLOOD RIGHT HAND  Final   Special Requests   Final    BOTTLES DRAWN AEROBIC AND ANAEROBIC Blood Culture results may not be optimal due to an excessive volume of blood received in culture bottles   Culture   Final    NO GROWTH 5 DAYS Performed at [facility], [address], [place], [place] [address]    Report Status 12/08/2018 FINAL  Final  SARS Coronavirus 2 (CEPHEID - Performed in [facility] hospital lab), Hosp Order     Status: None   Collection Time: 12/03/18  1:40 AM  Result Value Ref Range Status   SARS Coronavirus 2 NEGATIVE NEGATIVE Final    Comment: (NOTE) If result is NEGATIVE SARS-CoV-2 target nucleic acids are NOT DETECTED. The SARS-CoV-2 RNA is generally detectable in upper and lower  respiratory specimens during the acute phase of infection. The lowest  concentration of SARS-CoV-2 viral copies this assay can detect is 250  copies / mL. A negative result does not preclude SARS-CoV-2 infection  and should not be used as the sole basis for treatment or other  patient management decisions.  A negative result may occur with  improper specimen collection / handling, submission of specimen other  than nasopharyngeal swab, presence of viral mutation(s) within the  areas targeted by this assay, and inadequate number of viral copies  (<250 copies / mL). A negative result must be combined with clinical  observations, patient history, and epidemiological information. If result is POSITIVE SARS-CoV-2 target nucleic acids are DETECTED. The SARS-CoV-2 RNA is generally detectable in upper and lower  respiratory specimens dur ing the acute phase of infection.  Positive  results are indicative of active infection with SARS-CoV-2.  Clinical  correlation with patient history and other diagnostic  information is  necessary to determine patient infection status.  Positive results do  not rule out bacterial infection or co-infection with other viruses. If result is PRESUMPTIVE POSTIVE SARS-CoV-2 nucleic acids MAY BE PRESENT.   A  presumptive positive result was obtained on the submitted specimen  and confirmed on repeat testing.  While 2019 novel coronavirus  (SARS-CoV-2) nucleic acids may be present in the submitted sample  additional confirmatory testing may be necessary for epidemiological  and / or clinical management purposes  to differentiate between  SARS-CoV-2 and other Sarbecovirus currently known to infect humans.  If clinically indicated additional testing with an alternate test  methodology [phone] is advised. The SARS-CoV-2 RNA is generally  detectable in upper and lower respiratory sp ecimens during the acute  phase of infection. The expected result is Negative. Fact Sheet for Patients:  BoilerBrush.com.cyhttps://www.fda.gov/media/136312/download Fact Sheet for Healthcare Providers: https://pope.com/https://www.fda.gov/media/136313/download This test is not yet approved or cleared by the [place] FDA and has been authorized for detection and/or diagnosis of SARS-CoV-2 by FDA under an Emergency Use Authorization (EUA).  This EUA will remain in effect (meaning this test can be used) for the duration of the COVID-19 declaration under Section 564(b)(1) of the Act, 21 U.S.C. section 360bbb-3(b)(1), unless the authorization is terminated or revoked sooner. Performed at [facility], [address] [place], [place] [address]   MRSA PCR Screening     Status: None   Collection Time: 12/04/18  8:33 PM  Result Value Ref Range Status   MRSA by PCR NEGATIVE NEGATIVE Final    Comment:        The GeneXpert MRSA Assay (FDA approved for NASAL specimens only), is one component of a comprehensive MRSA colonization surveillance program. It is not intended to diagnose MRSA infection nor to guide or  monitor treatment for MRSA infections. Performed at [facility], [address] [place], [place] [address]   Aerobic/Anaerobic Culture (surgical/deep wound)     Status: None   Collection Time: 12/05/18  3:36 PM  Result Value Ref Range Status   Specimen Description FLUID RIGHT LUNG  Final   Special Requests PATIENT ON FOLLOWING ANCEF  Final   Gram Stain   Final    ABUNDANT WBC PRESENT, PREDOMINANTLY PMN FEW GRAM POSITIVE COCCI    Culture   Final    RARE STAPHYLOCOCCUS AUREUS NO ANAEROBES ISOLATED Performed at [facility], [address] [place], [place] [address]    Report Status 12/10/2018 FINAL  Final   Organism ID, Bacteria STAPHYLOCOCCUS AUREUS  Final      Susceptibility   Staphylococcus aureus - MIC*    CIPROFLOXACIN <=0.5 SENSITIVE Sensitive     ERYTHROMYCIN <=0.25 SENSITIVE Sensitive     GENTAMICIN <=0.5 SENSITIVE Sensitive     OXACILLIN <=0.25 SENSITIVE Sensitive     TETRACYCLINE <=1 SENSITIVE Sensitive     VANCOMYCIN 1 SENSITIVE Sensitive     TRIMETH/SULFA <=10 SENSITIVE Sensitive     CLINDAMYCIN <=0.25 SENSITIVE Sensitive     RIFAMPIN <=0.5 SENSITIVE Sensitive     Inducible Clindamycin NEGATIVE Sensitive     * RARE STAPHYLOCOCCUS AUREUS  Fungus Culture With Stain     Status: None (Preliminary result)   Collection Time: 12/05/18  3:49 PM  Result Value Ref Range Status   Fungus Stain Final report  Final    Comment: (NOTE) Performed At: [facility] [address] [place], [place] [id number] Jolene SchimkeNagendra Sanjai MD QI:[id number]    Fungus (Mycology) Culture PENDING  Incomplete   Fungal Source TISSUE  Final    Comment: RIGHT LUNG Performed at [facility], [address] [place], [place] [address]   Aerobic/Anaerobic Culture (surgical/deep wound)  Status: None   Collection Time: 12/05/18  3:49 PM  Result Value Ref Range Status   Specimen Description TISSUE RIGHT LUNG  Final   Special Requests PATIENT ON FOLLOWING ANCEF  Final    Gram Stain   Final    ABUNDANT WBC PRESENT, PREDOMINANTLY PMN ABUNDANT GRAM POSITIVE COCCI    Culture   Final    RARE STAPHYLOCOCCUS AUREUS NO ANAEROBES ISOLATED Performed at [facility], [address] [place], [place] [address]    Report Status 12/10/2018 FINAL  Final   Organism ID, Bacteria STAPHYLOCOCCUS AUREUS  Final      Susceptibility   Staphylococcus aureus - MIC*    CIPROFLOXACIN <=0.5 SENSITIVE Sensitive     ERYTHROMYCIN <=0.25 SENSITIVE Sensitive     GENTAMICIN <=0.5 SENSITIVE Sensitive     OXACILLIN <=0.25 SENSITIVE Sensitive     TETRACYCLINE <=1 SENSITIVE Sensitive     VANCOMYCIN <=0.5 SENSITIVE Sensitive     TRIMETH/SULFA <=10 SENSITIVE Sensitive     CLINDAMYCIN <=0.25 SENSITIVE Sensitive     RIFAMPIN <=0.5 SENSITIVE Sensitive     Inducible Clindamycin NEGATIVE Sensitive     * RARE STAPHYLOCOCCUS AUREUS  Acid Fast Smear (AFB)     Status: None   Collection Time: 12/05/18  3:49 PM  Result Value Ref Range Status   AFB Specimen Processing Comment  Final    Comment: Tissue Grinding and Digestion/Decontamination   Acid Fast Smear Negative  Final    Comment: (NOTE) Performed At: [facility] [address] [place], [place] [id number] Jolene SchimkeNagendra Sanjai MD AO:[id number]:[phone]    Source (AFB) TISSUE  Final    Comment: RIGHT LUNG Performed at [facility], [address] [place], [place] [address]   Fungus Culture Result     Status: None   Collection Time: 12/05/18  3:49 PM  Result Value Ref Range Status   Result 1 Comment  Final    Comment: (NOTE) KOH/Calcofluor preparation:  no fungus observed. Performed At: [facility] [place] [address] [place], [place] [id number] Jolene SchimkeNagendra Sanjai MD NU:[id number]:[phone]          Radiology Studies: Dg Chest Port 1 View  Result Date: 12/11/2018 CLINICAL DATA:  History of pneumothorax. EXAM: PORTABLE CHEST 1 VIEW COMPARISON:  Chest radiograph 12/10/2018 FINDINGS: Right upper extremity PICC line tip projects  over the superior vena cava. Monitoring leads overlie the patient. Stable cardiac and mediastinal contours. Right chest tube remains in place. Persistent consolidative opacities right lower lung. There is a small right basilar pneumothorax which appears new from prior. IMPRESSION: Right lower chest tube remains in place. There is a new small adjacent right basilar pneumothorax. These results will be called to the ordering clinician or representative by the Radiologist Assistant, and communication documented in the PACS or zVision Dashboard. Electronically Signed   By: Annia Beltrew  Davis M.D.   On: 12/11/2018 08:54   Dg Chest Port 1 View  Result Date: 12/10/2018 CLINICAL DATA:  Pleural effusion. EXAM: PORTABLE CHEST 1 VIEW COMPARISON:  Chest radiograph 12/09/2018 FINDINGS: Monitoring leads overlie the patient. Right upper extremity PICC line is present with tip projecting over the superior vena cava. Stable tortuosity of the thoracic aorta. Right chest tube remains in position. Similar-appearing patchy opacities right lower hemithorax. Small right pleural effusion. No definite pneumothorax. Suspected skin fold left lateral hemithorax. IMPRESSION: Right chest tube remains in position. Similar-appearing opacities right lower hemithorax with small right pleural effusion. No definite right-sided pneumothorax. Suspect skin fold projecting over the lateral  left hemithorax. Recommend attention on short-term follow-up chest radiograph. Electronically Signed   By: Annia Belt M.D.   On: 12/10/2018 08:34      Scheduled Meds: . sodium chloride   Intravenous Once  . atorvastatin  20 mg Oral QHS  . bisacodyl  10 mg Oral Daily  . doxepin  100 mg Oral QHS  . feeding supplement (ENSURE ENLIVE)  237 mL Oral TID BM  . feeding supplement (PRO-STAT SUGAR FREE 64)  30 mL Oral BID  . gabapentin  600 mg Oral QID  . multivitamin with minerals  1 tablet Oral Daily  . nicotine  14 mg Transdermal Daily  . pantoprazole  40 mg Oral  BID AC  . rifampin  300 mg Oral Q12H  . senna-docusate  1 tablet Oral QHS  . sodium chloride flush  10-40 mL Intracatheter Q12H  . tiZANidine  4 mg Oral QHS   Continuous Infusions: .  ceFAZolin (ANCEF) IV 2 g (12/11/18 1238)     LOS: 9 days      Lynden [place], MD [facility] Pager: [email or web address] Password [email or web address] 12/11/2018, 8:04 PM

## 2018-12-12 ENCOUNTER — Inpatient Hospital Stay (HOSPITAL_COMMUNITY): Payer: Medicare Other

## 2018-12-12 NOTE — Progress Notes (Signed)
Physical Therapy Wound Treatment Patient Details  Name: Randy Page MRN: [id number] Date of Birth: 02/03/1956  Today's Date: 12/12/2018 Time: 1329-1400 Time Calculation (min): 31 min  Subjective  Patient and Family Stated Goals: healed up some and home to continue Prior Treatments: surgical debridement 6/1  Pain Score: Pain Score: 10-Worst pain ever  Wound Assessment  Pressure Injury 12/02/18 Buttocks Right Unstageable - Full thickness tissue loss in which the base of the ulcer is covered by slough (yellow, tan, gray, green or brown) and/or eschar (tan, brown or black) in the wound bed. (Active)  Wound Image   12/10/2018  5:27 PM  Dressing Type Barrier Film (skin prep);Gauze (Comment);Moist to dry 12/12/2018  5:26 PM  Dressing Changed;Clean 12/12/2018  5:26 PM  Dressing Change Frequency Daily 12/12/2018  5:26 PM  State of Healing Eschar 12/12/2018  5:26 PM  Site / Wound Assessment Brown;Yellow 12/12/2018  5:26 PM  % Wound base Red or Granulating 10% 12/12/2018  5:26 PM  % Wound base Yellow/Fibrinous Exudate 90% 12/12/2018  5:26 PM  Peri-wound Assessment Erythema (blanchable) 12/12/2018  5:26 PM  Wound Length (cm) 9 cm 12/10/2018  5:27 PM  Wound Width (cm) 6 cm 12/10/2018  5:27 PM  Wound Depth (cm) 3 cm 12/10/2018  5:27 PM  Wound Surface Area (cm^2) 54 cm^2 12/10/2018  5:27 PM  Wound Volume (cm^3) 162 cm^3 12/10/2018  5:27 PM  Margins Unattached edges (unapproximated) 12/12/2018  5:26 PM  Drainage Amount Moderate 12/12/2018  5:26 PM  Drainage Description Serosanguineous 12/12/2018  5:26 PM  Treatment Cleansed;Debridement (Selective);Hydrotherapy (Pulse lavage) 12/12/2018  5:26 PM      Hydrotherapy Pulsed lavage therapy - wound location: R buttock/sacral Pulsed Lavage with Suction (psi): 8 psi(to 12) Pulsed Lavage with Suction - Normal Saline Used: 1000 mL Pulsed Lavage Tip: Tip with splash shield Selective Debridement Selective Debridement - Location: R buttock/sacrum Selective Debridement - Tools Used:  Forceps;Scalpel;Scissors Selective Debridement - Tissue Removed: Thick yellow eschar   Wound Assessment and Plan  Wound Therapy - Assess/Plan/Recommendations Wound Therapy - Clinical Statement: This wound needs hydrotherapy for PLS to soften this thick eschar for selective debridement.  Not yet stageable and not sure if small left sites will end up a part of 1 wound.  The left sites were not addressed formally on evaluation Wound Therapy - Functional Problem List: balance,stability issues Factors Delaying/Impairing Wound Healing: Multiple medical problems;Infection - systemic/local Hydrotherapy Plan: Debridement;Dressing change;Patient/family education;Pulsatile lavage with suction Wound Therapy - Frequency: 6X / week Wound Therapy - Current Recommendations: PT Wound Therapy - Follow Up Recommendations: Home health RN Wound Plan: see above  Wound Therapy Goals- Improve the function of patient's integumentary system by progressing the wound(s) through the phases of wound healing (inflammation - proliferation - remodeling) by: Decrease Necrotic Tissue to: 25% Decrease Necrotic Tissue - Progress: Progressing toward goal Increase Granulation Tissue to: 75% Increase Granulation Tissue - Progress: Progressing toward goal Goals/treatment plan/discharge plan were made with and agreed upon by patient/family: Yes Time For Goal Achievement: 7 days Wound Therapy - Potential for Goals: Good  Goals will be updated until maximal potential achieved or discharge criteria met.  Discharge criteria: when goals achieved, discharge from hospital, MD decision/surgical intervention, no progress towards goals, refusal/missing three consecutive treatments without notification or medical reason.  GP     Tessie Fass Layia Walla 12/12/2018, 5:29 PM  12/12/2018  Donnella Sham, PT Acute Rehabilitation Services [phone]  (pager) [phone]  (office)

## 2018-12-12 NOTE — Progress Notes (Addendum)
Physical Therapy Treatment Patient Details Name: Randy Page MRN: [id number] DOB: 11-06-1955 Today's Date: 12/12/2018    History of Present Illness Randy Page 63 y.o. male with medical history significant for hypertension, hyperlipidemia, recent GI bleed due to duodenal ulcer, anemia, chronic neck pain status post cervical fusion who is transferred to [facility] from [facility] for further management of right-sided empyema and MSSA bacteremia. s/p EXCISIONAL DEBRIDEMENT OF RIGHT SACRAL WOUND on 12/05/18    PT Comments    Pt slowly improving with gait stability with lighter use of the RW.  Pt continues to defer stair training.  Vitals stable overall.    Follow Up Recommendations  Home health PT;Supervision for mobility/OOB     Equipment Recommendations  None recommended by PT    Recommendations for Other Services       Precautions / Restrictions Precautions Precautions: Fall    Mobility  Bed Mobility Overal bed mobility: Modified Independent                Transfers Overall transfer level: Needs assistance   Transfers: Sit to/from Stand Sit to Stand: Min guard         General transfer comment: pt is not safety conscious and needing cues for hand placement  Ambulation/Gait Ambulation/Gait assistance: Min guard Gait Distance (Feet): 280 Feet Assistive device: Rolling walker (2 wheeled) Gait Pattern/deviations: Step-through pattern Gait velocity: slower Gait velocity interpretation: 1.31 - 2.62 ft/sec, indicative of limited community ambulator General Gait Details: Overall, pt drifts around with RW, often is outside of the RW during turns and doesn't take instruction well.   Stairs Stairs: (refuses to do the stairs.)           Wheelchair Mobility    Modified Rankin (Stroke Patients Only)       Balance Overall balance assessment: Needs assistance Sitting-balance support: No upper extremity supported;Feet supported Sitting  balance-Leahy Scale: Good       Standing balance-Leahy Scale: Fair                              Cognition Arousal/Alertness: Awake/alert Behavior During Therapy: WFL for tasks assessed/performed;Anxious Overall Cognitive Status: Within Functional Limits for tasks assessed                                        Exercises      General Comments General comments (skin integrity, edema, etc.): vss      Pertinent Vitals/Pain Pain Assessment: Faces Faces Pain Scale: Hurts a little bit Pain Location: chest tube site and buttocks Pain Descriptors / Indicators: Discomfort;Grimacing Pain Intervention(s): Monitored during session    Home Living                      Prior Function            PT Goals (current goals can now be found in the care plan section) Acute Rehab PT Goals PT Goal Formulation: With patient Time For Goal Achievement: 12/17/18 Potential to Achieve Goals: Good Progress towards PT goals: Progressing toward goals    Frequency    Min 3X/week      PT Plan Current plan remains appropriate    Co-evaluation              AM-PAC PT "6 Clicks" Mobility   Outcome Measure  Help  needed turning from your back to your side while in a flat bed without using bedrails?: None Help needed moving from lying on your back to sitting on the side of a flat bed without using bedrails?: None Help needed moving to and from a bed to a chair (including a wheelchair)?: A Little Help needed standing up from a chair using your arms (e.g., wheelchair or bedside chair)?: A Little Help needed to walk in hospital room?: A Little Help needed climbing 3-5 steps with a railing? : A Little 6 Click Score: 20    End of Session   Activity Tolerance: Patient tolerated treatment well Patient left: in bed;with call bell/phone within reach Nurse Communication: Mobility status PT Visit Diagnosis: Unsteadiness on feet (R26.81)     Time:  [id number] PT Time Calculation (min) (ACUTE ONLY): 13 min  Charges:  $Gait Training: 8-22 mins                     12/12/2018  [place] BingKen [name], PT Acute Rehabilitation Services [phone]  (pager) [phone]  (office)   Randy Page 12/12/2018, 5:22 PM

## 2018-12-12 NOTE — Care Management Important Message (Signed)
Important Message  Patient Details  Name: Randy Page MRN: [id number] Date of Birth: [birth year]   Medicare Important Message Given:  Yes    Shelda Altes 12/12/2018, 12:07 PM

## 2018-12-12 NOTE — Progress Notes (Addendum)
      [address]       [place] [address]             [phone]       7 Days Post-Op Procedure(s) (LRB): RIGHT VIDEO ASSISTED THORACOSCOPY DRAIN EMPYEMA/LUNG ABSCESS (Right) Excisional Debridement Right Sacral Wound (Right)  Subjective: Patient "tired of being in the hospital" and "being in the bed".  Objective: Vital signs in last 24 hours: Temp:  [97.8 F (36.6 C)-99 F (37.2 C)] 98.6 F (37 C) (06/08 0601) Pulse Rate:  [58-94] 94 (06/08 0601) Cardiac Rhythm: Normal sinus rhythm (06/07 2020) Resp:  [17-20] 19 (06/08 0601) BP: (124-143)/(69-97) 143/83 (06/08 0601) SpO2:  [98 %-100 %] 98 % (06/08 0601) Weight:  [54.7 kg] 54.7 kg (06/08 0601)     Intake/Output from previous day: 06/07 0701 - 06/08 0700 In: 440 [P.O.:440] Out: 640 [Urine:500; Chest Tube:140]   Physical Exam:  Cardiovascular: RRR Pulmonary: Diminished breath sounds bilaterally Abdomen: Soft, non tender, bowel sounds present. Extremities: No LE edema Wounds: Dressing is clean and dry.   Chest Tube: to suction;persistent 3+ air leak   Lab Results: CBC: Recent Labs    12/11/18 0522  WBC 7.7  HGB 8.6*  HCT 27.9*  PLT 328   BMET:  Recent Labs    12/11/18 0522  NA 138  K 3.5  CL 100  CO2 29  GLUCOSE 123*  BUN 7*  CREATININE 0.68  CALCIUM 8.4*    PT/INR:  No results for input(s): LABPROT, INR in the last 72 hours. ABG:  INR: Will add last result for INR, ABG once components are confirmed Will add last 4 CBG results once components are confirmed  Assessment/Plan:  1. CV - SR in the 80-90's. 2.  Pulmonary - On room air. Chest tube with 140 cc last 24 hours. Chest tube is to suction and there continues to be a persistent +3 air leak (likely had lung abscess). CXR this am appears stable. As discussed with Dr. Roxan Hockey, will place chest tube to water seal. If CXR stable in am, could transition to a mini express. Encourage incentive spirometer. Gram stain of  pleural fluid showed gram positive cocci; culture shows rare Staph Aureus 3. ID- on Cefazolin 2 grams IV tid and Rifampin 300 mg orally bid 4. Anemia- H and H this am stable at 8.6 and 27.9 (previous transfusion) 5. Sacral wound-s/p debridement, dressing changes per general surgery 6. Deconditioning-continue with PT  Donielle M ZimmermanPA-C 12/12/2018,7:07 AM [phone]  Patient examined and a.m. chest x-ray image personally reviewed Agree with plan for waterseal and follow-up chest x-ray in a.m. Patient breathing comfortably at this time patient examined and medical record reviewed,agree with above note. Tharon Aquas Trigt III 12/12/2018

## 2018-12-12 NOTE — Progress Notes (Signed)
[facility] Progress Note  7 Days Post-Op   Assessment/Plan Principal Problem: MSSA bacteremia Active Problems: Essential hypertension Cigarette smoker Hyperlipidemia Empyema of right pleural space (HCC) Chronic pain Duodenal ulcer Decubitus ulcer Elevated INR  R Empyema - S/P VATS, Dr. Laneta SimmersBartle, 06/01  L buttock/sacral decub -S/P excisional debridement of right sacral wound, Dr. Andrey CampanileWilson, 06/01 - wet to dry dressing changes qshift, pink dressing to other areas - antibiotics per ID - continue hydrotherapy  Follow up:[facility] 2-3 weeks for wound check. We will see again on Wednesday if pt is still here.    LOS: 10 days    Subjective: CC: buttock pain  He does not like the air mattress. He states his bottom is still sore. No issues overnight.   Objective: Vital signs in last 24 hours: Temp:  [98.3 F (36.8 C)-99 F (37.2 C)] 98.3 F (36.8 C) (06/08 0806) Pulse Rate:  [58-94] 94 (06/08 0601) Resp:  [17-20] 19 (06/08 0601) BP: (124-143)/(71-97) 133/71 (06/08 0806) SpO2:  [98 %-100 %] 98 % (06/08 0601) Weight:  [54.7 kg] 54.7 kg (06/08 0601) Last BM Date: 12/11/18  Intake/Output from previous day: 06/07 0701 - 06/08 0700 In: 440 [P.O.:440] Out: 640 [Urine:500; Chest Tube:140] Intake/Output this shift: Total I/O In: 240 [P.O.:240] Out: 100 [Urine:100]  PE: Gen: Alert, NAD, pleasant, cooperative Pulm:Rate andeffort normal GU: sacral wound looks clean and is without purulent drainage, improved erythema,no TTP,moderate slough at wound base. see photo below Skin: no rashes noted, warm and dry      Anti-infectives: Anti-infectives (From admission, onward)   Start     Dose/Rate Route Frequency Ordered Stop   12/02/18 2330  ceFAZolin (ANCEF) IVPB 2g/100 mL premix     2 g 200 mL/hr over 30 Minutes Intravenous Every 8 hours 12/02/18 2322 12/18/18 2359   12/02/18 2330  rifampin (RIFADIN) capsule 300 mg     300  mg Oral Every 12 hours 12/02/18 2322 12/18/18 2359      Lab Results:  Recent Labs    12/11/18 0522  WBC 7.7  HGB 8.6*  HCT 27.9*  PLT 328   BMET Recent Labs    12/11/18 0522  NA 138  K 3.5  CL 100  CO2 29  GLUCOSE 123*  BUN 7*  CREATININE 0.68  CALCIUM 8.4*   PT/INR No results for input(s): LABPROT, INR in the last 72 hours. CMP     Component Value Date/Time   NA 138 12/11/2018 0522   NA 139 09/15/2017 1040   K 3.5 12/11/2018 0522   CL 100 12/11/2018 0522   CO2 29 12/11/2018 0522   GLUCOSE 123 (H) 12/11/2018 0522   BUN 7 (L) 12/11/2018 0522   BUN 5 (L) 09/15/2017 1040   CREATININE 0.68 12/11/2018 0522   CALCIUM 8.4 (L) 12/11/2018 0522   PROT 6.2 (L) 12/11/2018 0522   ALBUMIN 1.8 (L) 12/11/2018 0522   AST 18 12/11/2018 0522   ALT 6 12/11/2018 0522   ALKPHOS 101 12/11/2018 0522   BILITOT 0.5 12/11/2018 0522   GFRNONAA >60 12/11/2018 0522   GFRAA >60 12/11/2018 0522   Lipase  No results found for: LIPASE  Studies/Results: Dg Chest Port 1 View  Result Date: 12/12/2018 CLINICAL DATA:  63 year old male with a history of empyema, status post VATS 12/05/2018, with shortness of breath and pneumothorax. EXAM: PORTABLE CHEST 1 VIEW COMPARISON:  12/11/2018, 12/10/2018, 12/09/2018 FINDINGS: Cardiomediastinal silhouette unchanged in size and contour. Unchanged thoracostomy tube of the right chest, terminating at  the costophrenic angle. Persisting pleuroparenchymal thickening at the right lung base laterally. No visualized pneumothorax. Patchy opacity at the right lung base persist. Thickening of the minor fissure persist. Left lung well aerated. Surgical changes of the cervical region. Right upper extremity PICC is unchanged. IMPRESSION: Unchanged position of right thoracostomy tube with similar appearance of pleuroparenchymal thickening at the lung base, potentially combination of postoperative changes, and/or persisting pleural fluid. No visualized pneumothorax on the  current plain film. Patchy airspace opacity at the right lung base persist. Unchanged right upper extremity PICC. Electronically Signed   By: Corrie Mckusick D.O.   On: 12/12/2018 08:00   Dg Chest Port 1 View  Result Date: 12/11/2018 CLINICAL DATA:  History of pneumothorax. EXAM: PORTABLE CHEST 1 VIEW COMPARISON:  Chest radiograph 12/10/2018 FINDINGS: Right upper extremity PICC line tip projects over the superior vena cava. Monitoring leads overlie the patient. Stable cardiac and mediastinal contours. Right chest tube remains in place. Persistent consolidative opacities right lower lung. There is a small right basilar pneumothorax which appears new from prior. IMPRESSION: Right lower chest tube remains in place. There is a new small adjacent right basilar pneumothorax. These results will be called to the ordering clinician or representative by the Radiologist Assistant, and communication documented in the PACS or zVision Dashboard. Electronically Signed   By: Lovey Newcomer M.D.   On: 12/11/2018 08:54      Kalman Drape , [facility] 12/12/2018, 9:46 AM  Pager: [phone] Mon-Wed, Friday 7:00am-4:30pm Thurs 7am-11:30am  Consults: [phone]

## 2018-12-12 NOTE — Progress Notes (Signed)
PROGRESS NOTE    Randy LiterRobert J Page   WUJ:[id number]  DOB: 1956-06-11  DOA: 12/02/2018 PCP: Karle PlumberArvind, Moogali M, MD   Brief Narrative:  Randy Page 63 y.o. male with medical history significant for hypertension, hyperlipidemia, recent GI bleed due to duodenal ulcer, anemia, chronic neck pain status post cervical fusion who is transferred [facility] for further management of right-sided empyema and MSSA bacteremia. He presented to [name] after a fall at home. He was found to be hypoglycemia and hypotensive (SBP 80s) by EMS.  Lactic acid was 12.3 CT chest > large air and fluid collection in right hemithorax favored to represent a large empyema, complete collapse of RML and near compete collapse of RLL. Tree in bud opacities in RUL and ELL. Emphysematous changes.   He was previously admitted to [facility] from 10/31/18- 11/03/18 for GI bleed found to be due to a duodenal ulcer.    Subjective: Constipation resolved.  No nausea no vomiting no fever no chills.  No abdominal pain.  Assessment & Plan: MSSA bacteremia, HCAP,  empyema, acute hypoxic respiratory failure - underwent chest tube on 5/27 - plan at [name] for 3 wks of Cefazolin and Rifampin  - repeat blood cultures negative  - repeat pleural fluid cultures showing MSSA. - transferred to [facility] for [facility] surgery to evaluate - 6/1- underwent uncompliacated VATS by Dr Laneta SimmersBartle cultures are growing staph aureus pansensitive. - wean O2 as able - ID recommend PICC and 2 wks of Cefazolin and Rifampin after VATS- Have PICC.  Last day of antibiotic 12/18/2018.  Hypotension with h/o Essential hypertension - holding antihypertensives - Cosyntropin test ordered and administered but ACTH not drawn by lab Now blood pressure better.  Monitor.  Decubitus ulcer b/l but much larger on right buttock- present on admission - followed by wound care clinic as outpt - consulted general surgery - underwent debridement  after VATS in OR - added air mattress, currently getting hydrotherapy.  Hypoalbuminemia, underweight Body mass index is 17.81 kg/m. - in context of current illness - have added Ensure and requested an nutrition eval- recommendations> TID Ensure  Elevated INR -Treated with Vit K x 3 days  - INR is noted to be improved  Hyperlipidemia - cont Atorvastatin  Hypokalemia replaced    Chronic pain in neck and back - s/p cervical fusion - cont Oxy IR 15 mg Q4 hrs PRN and Gabapentin  Duodenal ulcer with GI bleed s/p clipping 4/20 - cont Protonix  Anemia- normocytic -  Hb 7.4 -8.5 range -  no prior Hb to compare with -  anemia panel consistent with AOCD  Nicotine abuse - counseled to quit- he states he would like to but his girlfriend also smokes - cont Nicotine patch  Hyperthyroid -cont Tapazol  Deconditioning/ fall on admission - PT eval ordered- recommending HHPT  Time spent in minutes: 35 min DVT prophylaxis: SCDs Code Status: Full code Family Communication:  Disposition Plan: HHPT recommended Consultants:   CT surgery  General surgery Procedures:   Chest tube  VATS  I and D of decubitus Antimicrobials:  Anti-infectives (From admission, onward)   Start     Dose/Rate Route Frequency Ordered Stop   12/02/18 2330  ceFAZolin (ANCEF) IVPB 2g/100 mL premix     2 g 200 mL/hr over 30 Minutes Intravenous Every 8 hours 12/02/18 2322 12/18/18 2359   12/02/18 2330  rifampin (RIFADIN) capsule 300 mg     300 mg Oral Every 12 hours 12/02/18  2322 12/18/18 2359       Objective: Vitals:   12/12/18 0601 12/12/18 0806 12/12/18 1216 12/12/18 1653  BP: (!) 143/83 133/71 (!) 157/89 115/66  Pulse: 94  84   Resp: 19  18   Temp: 98.6 F (37 C) 98.3 F (36.8 C) 99 F (37.2 C) 99.2 F (37.3 C)  TempSrc: Oral Oral Oral Oral  SpO2: 98%  100% 97%  Weight: 54.7 kg     Height: 5\' 9"  (1.753 m)       Intake/Output Summary (Last 24 hours) at 12/12/2018 1704 Last data filed  at 12/12/2018 1218 Gross per 24 hour  Intake 480 ml  Output 740 ml  Net -260 ml   Filed Weights   12/10/18 0500 12/11/18 0600 12/12/18 0601  Weight: 53.6 kg 54.4 kg 54.7 kg    Examination:  General exam: Appears comfortable  HEENT: PERRLA, oral mucosa moist, no sclera icterus or thrush Respiratory system: Crackles in RLL, bloody fluid in chest tube- Respiratory effort normal. Cardiovascular system: S1 & S2 heard,  No murmurs - tachycardic  Gastrointestinal system: Abdomen soft, non-tender, nondistended. Normal bowel sounds   Central nervous system: Alert and oriented. No focal neurological deficits. Extremities: No cyanosis, clubbing or edema Skin: dressing just changed by surgery and thus I did no re-open it today Psychiatry:  Mood & affect appropriate.  Psychiatry:  Mood & affect appropriate.   Data Reviewed: I have personally reviewed following labs and imaging studies  CBC: Recent Labs  Lab 12/06/18 0805 12/07/18 0434 12/07/18 1630 12/11/18 0522  WBC 6.0 7.0 6.2 7.7  NEUTROABS  --   --   --  5.0  HGB 8.1* 6.8* 8.7* 8.6*  HCT 27.6* 23.2* 28.0* 27.9*  MCV 97.2 98.7 94.0 97.9  PLT 273 246 284 328   Basic Metabolic Panel: Recent Labs  Lab 12/06/18 0805 12/07/18 0434 12/08/18 0519 12/11/18 0522  NA 136 135 136 138  K 5.0 3.8 3.4* 3.5  CL 102 98 96* 100  CO2 28 29 32 29  GLUCOSE 128* 107* 131* 123*  BUN 8 8 <5* 7*  CREATININE 0.74 0.75 0.59* 0.68  CALCIUM 8.2* 8.1* 8.5* 8.4*   GFR: Estimated Creatinine Clearance: 73.1 mL/min (by C-G formula based on SCr of 0.68 mg/dL). Liver Function Tests: Recent Labs  Lab 12/08/18 0519 12/11/18 0522  AST 16 18  ALT 6 6  ALKPHOS 100 101  BILITOT 0.6 0.5  PROT 6.2* 6.2*  ALBUMIN 1.7* 1.8*   No results for input(s): LIPASE, AMYLASE in the last 168 hours. No results for input(s): AMMONIA in the last 168 hours. Coagulation Profile: No results for input(s): INR, PROTIME in the last 168 hours. Cardiac Enzymes: No  results for input(s): CKTOTAL, CKMB, CKMBINDEX, TROPONINI in the last 168 hours. BNP (last 3 results) No results for input(s): PROBNP in the last 8760 hours. HbA1C: No results for input(s): HGBA1C in the last 72 hours. CBG: Recent Labs  Lab 12/05/18 2152 12/06/18 0638 12/06/18 1107 12/06/18 1648 12/07/18 1234  GLUCAP 184* 130* 217* 107* 98   Lipid Profile: No results for input(s): CHOL, HDL, LDLCALC, TRIG, CHOLHDL, LDLDIRECT in the last 72 hours. Thyroid Function Tests: No results for input(s): TSH, T4TOTAL, FREET4, T3FREE, THYROIDAB in the last 72 hours. Anemia Panel: No results for input(s): VITAMINB12, FOLATE, FERRITIN, TIBC, IRON, RETICCTPCT in the last 72 hours. Urine analysis: No results found for: COLORURINE, APPEARANCEUR, LABSPEC, PHURINE, GLUCOSEU, HGBUR, BILIRUBINUR, KETONESUR, PROTEINUR, UROBILINOGEN, NITRITE, LEUKOCYTESUR Sepsis Labs: @LABRCNTIP (procalcitonin:4,lacticidven:4) )  Recent Results (from the past 240 hour(s))  Culture, blood (Routine X 2) w Reflex to ID Panel     Status: None   Collection Time: 12/02/18 11:41 PM  Result Value Ref Range Status   Specimen Description BLOOD RIGHT HAND  Final   Special Requests   Final    BOTTLES DRAWN AEROBIC AND ANAEROBIC Blood Culture adequate volume   Culture   Final    NO GROWTH 5 DAYS Performed at [facility], [address] [place], [place] [address]    Report Status 12/08/2018 FINAL  Final  Culture, blood (Routine X 2) w Reflex to ID Panel     Status: None   Collection Time: 12/02/18 11:41 PM  Result Value Ref Range Status   Specimen Description BLOOD RIGHT HAND  Final   Special Requests   Final    BOTTLES DRAWN AEROBIC AND ANAEROBIC Blood Culture results may not be optimal due to an excessive volume of blood received in culture bottles   Culture   Final    NO GROWTH 5 DAYS Performed at [facility], [address], [place], [place] [address]    Report Status 12/08/2018 FINAL  Final  SARS  Coronavirus 2 (CEPHEID - Performed in [facility] hospital lab), Hosp Order     Status: None   Collection Time: 12/03/18  1:40 AM  Result Value Ref Range Status   SARS Coronavirus 2 NEGATIVE NEGATIVE Final    Comment: (NOTE) If result is NEGATIVE SARS-CoV-2 target nucleic acids are NOT DETECTED. The SARS-CoV-2 RNA is generally detectable in upper and lower  respiratory specimens during the acute phase of infection. The lowest  concentration of SARS-CoV-2 viral copies this assay can detect is 250  copies / mL. A negative result does not preclude SARS-CoV-2 infection  and should not be used as the sole basis for treatment or other  patient management decisions.  A negative result may occur with  improper specimen collection / handling, submission of specimen other  than nasopharyngeal swab, presence of viral mutation(s) within the  areas targeted by this assay, and inadequate number of viral copies  (<250 copies / mL). A negative result must be combined with clinical  observations, patient history, and epidemiological information. If result is POSITIVE SARS-CoV-2 target nucleic acids are DETECTED. The SARS-CoV-2 RNA is generally detectable in upper and lower  respiratory specimens dur ing the acute phase of infection.  Positive  results are indicative of active infection with SARS-CoV-2.  Clinical  correlation with patient history and other diagnostic information is  necessary to determine patient infection status.  Positive results do  not rule out bacterial infection or co-infection with other viruses. If result is PRESUMPTIVE POSTIVE SARS-CoV-2 nucleic acids MAY BE PRESENT.   A presumptive positive result was obtained on the submitted specimen  and confirmed on repeat testing.  While 2019 novel coronavirus  (SARS-CoV-2) nucleic acids may be present in the submitted sample  additional confirmatory testing may be necessary for epidemiological  and / or clinical management purposes  to  differentiate between  SARS-CoV-2 and other Sarbecovirus currently known to infect humans.  If clinically indicated additional testing with an alternate test  methodology [phone]) is advised. The SARS-CoV-2 RNA is generally  detectable in upper and lower respiratory sp ecimens during the acute  phase of infection. The expected result is Negative. Fact Sheet for Patients:  BoilerBrush.com.cy Fact Sheet for Healthcare Providers: https://pope.com/ This test is not yet approved or cleared  by the [place] and has been authorized for detection and/or diagnosis of SARS-CoV-2 by FDA under an Emergency Use Authorization (EUA).  This EUA will remain in effect (meaning this test can be used) for the duration of the COVID-19 declaration under Section 564(b)(1) of the Act, 21 U.S.C. section 360bbb-3(b)(1), unless the authorization is terminated or revoked sooner. Performed at [facility], [place] [address], [place], [place] [address]   MRSA PCR Screening     Status: None   Collection Time: 12/04/18  8:33 PM  Result Value Ref Range Status   MRSA by PCR NEGATIVE NEGATIVE Final    Comment:        The GeneXpert MRSA Assay (FDA approved for NASAL specimens only), is one component of a comprehensive MRSA colonization surveillance program. It is not intended to diagnose MRSA infection nor to guide or monitor treatment for MRSA infections. Performed at [facility], [place] [address] [place], [place] [address]   Aerobic/Anaerobic Culture (surgical/deep wound)     Status: None   Collection Time: 12/05/18  3:36 PM  Result Value Ref Range Status   Specimen Description FLUID RIGHT LUNG  Final   Special Requests PATIENT ON FOLLOWING ANCEF  Final   Gram Stain   Final    ABUNDANT WBC PRESENT, PREDOMINANTLY PMN FEW GRAM POSITIVE COCCI    Culture   Final    RARE STAPHYLOCOCCUS AUREUS NO ANAEROBES ISOLATED Performed at [facility], [place] [address], [place], [place] [address]    Report Status 12/10/2018 FINAL  Final   Organism ID, Bacteria STAPHYLOCOCCUS AUREUS  Final      Susceptibility   Staphylococcus aureus - MIC*    CIPROFLOXACIN <=0.5 SENSITIVE Sensitive     ERYTHROMYCIN <=0.25 SENSITIVE Sensitive     GENTAMICIN <=0.5 SENSITIVE Sensitive     OXACILLIN <=0.25 SENSITIVE Sensitive     TETRACYCLINE <=1 SENSITIVE Sensitive     VANCOMYCIN 1 SENSITIVE Sensitive     TRIMETH/SULFA <=10 SENSITIVE Sensitive     CLINDAMYCIN <=0.25 SENSITIVE Sensitive     RIFAMPIN <=0.5 SENSITIVE Sensitive     Inducible Clindamycin NEGATIVE Sensitive     * RARE STAPHYLOCOCCUS AUREUS  Fungus Culture With Stain     Status: None (Preliminary result)   Collection Time: 12/05/18  3:49 PM  Result Value Ref Range Status   Fungus Stain Final report  Final    Comment: (NOTE) Performed At: [facility] [address] [place], [place] [id number] Rush Farmer MD [id number]    Fungus (Mycology) Culture PENDING  Incomplete   Fungal Source TISSUE  Final    Comment: RIGHT LUNG Performed at [facility], [place] [address] [place], [place] [address]   Aerobic/Anaerobic Culture (surgical/deep wound)     Status: None   Collection Time: 12/05/18  3:49 PM  Result Value Ref Range Status   Specimen Description TISSUE RIGHT LUNG  Final   Special Requests PATIENT ON FOLLOWING ANCEF  Final   Gram Stain   Final    ABUNDANT WBC PRESENT, PREDOMINANTLY PMN ABUNDANT GRAM POSITIVE COCCI    Culture   Final    RARE STAPHYLOCOCCUS AUREUS NO ANAEROBES ISOLATED Performed at [facility], [place] [address], [place], [place] [address]    Report Status 12/10/2018 FINAL  Final   Organism ID, Bacteria STAPHYLOCOCCUS AUREUS  Final      Susceptibility   Staphylococcus aureus - MIC*    CIPROFLOXACIN <=0.5 SENSITIVE Sensitive  ERYTHROMYCIN <=0.25 SENSITIVE Sensitive     GENTAMICIN <=0.5 SENSITIVE Sensitive     OXACILLIN <=0.25  SENSITIVE Sensitive     TETRACYCLINE <=1 SENSITIVE Sensitive     VANCOMYCIN <=0.5 SENSITIVE Sensitive     TRIMETH/SULFA <=10 SENSITIVE Sensitive     CLINDAMYCIN <=0.25 SENSITIVE Sensitive     RIFAMPIN <=0.5 SENSITIVE Sensitive     Inducible Clindamycin NEGATIVE Sensitive     * RARE STAPHYLOCOCCUS AUREUS  Acid Fast Smear (AFB)     Status: None   Collection Time: 12/05/18  3:49 PM  Result Value Ref Range Status   AFB Specimen Processing Comment  Final    Comment: Tissue Grinding and Digestion/Decontamination   Acid Fast Smear Negative  Final    Comment: (NOTE) Performed At: [facility] [place] [address] [place], [place] [id number] Jolene SchimkeNagendra Sanjai MD BJ:[id number]:[phone]    Source (AFB) TISSUE  Final    Comment: RIGHT LUNG Performed at [facility], [address] [place], [place] [address]   Fungus Culture Result     Status: None   Collection Time: 12/05/18  3:49 PM  Result Value Ref Range Status   Result 1 Comment  Final    Comment: (NOTE) KOH/Calcofluor preparation:  no fungus observed. Performed At: [facility] [place] [address] [place], [place] [id number] Jolene SchimkeNagendra Sanjai MD WU:[id number]:[phone]          Radiology Studies: Dg Chest Port 1 View  Result Date: 12/12/2018 CLINICAL DATA:  63 year old male with a history of empyema, status post VATS 12/05/2018, with shortness of breath and pneumothorax. EXAM: PORTABLE CHEST 1 VIEW COMPARISON:  12/11/2018, 12/10/2018, 12/09/2018 FINDINGS: Cardiomediastinal silhouette unchanged in size and contour. Unchanged thoracostomy tube of the right chest, terminating at the costophrenic angle. Persisting pleuroparenchymal thickening at the right lung base laterally. No visualized pneumothorax. Patchy opacity at the right lung base persist. Thickening of the minor fissure persist. Left lung well aerated. Surgical changes of the cervical region. Right upper extremity PICC is unchanged. IMPRESSION: Unchanged position of right thoracostomy  tube with similar appearance of pleuroparenchymal thickening at the lung base, potentially combination of postoperative changes, and/or persisting pleural fluid. No visualized pneumothorax on the current plain film. Patchy airspace opacity at the right lung base persist. Unchanged right upper extremity PICC. Electronically Signed   By: Gilmer MorJaime  Wagner D.O.   On: 12/12/2018 08:00   Dg Chest Port 1 View  Result Date: 12/11/2018 CLINICAL DATA:  History of pneumothorax. EXAM: PORTABLE CHEST 1 VIEW COMPARISON:  Chest radiograph 12/10/2018 FINDINGS: Right upper extremity PICC line tip projects over the superior vena cava. Monitoring leads overlie the patient. Stable cardiac and mediastinal contours. Right chest tube remains in place. Persistent consolidative opacities right lower lung. There is a small right basilar pneumothorax which appears new from prior. IMPRESSION: Right lower chest tube remains in place. There is a new small adjacent right basilar pneumothorax. These results will be called to the ordering clinician or representative by the Radiologist Assistant, and communication documented in the PACS or zVision Dashboard. Electronically Signed   By: Annia Beltrew  Davis M.D.   On: 12/11/2018 08:54      Scheduled Meds: . sodium chloride   Intravenous Once  . atorvastatin  20 mg Oral QHS  . bisacodyl  10 mg Oral Daily  . doxepin  100 mg Oral QHS  . feeding supplement (ENSURE ENLIVE)  237 mL Oral TID BM  . feeding supplement (PRO-STAT SUGAR FREE 64)  30 mL Oral BID  .  gabapentin  600 mg Oral QID  . multivitamin with minerals  1 tablet Oral Daily  . nicotine  14 mg Transdermal Daily  . pantoprazole  40 mg Oral BID AC  . rifampin  300 mg Oral Q12H  . senna-docusate  1 tablet Oral QHS  . sodium chloride flush  10-40 mL Intracatheter Q12H  . tiZANidine  4 mg Oral QHS   Continuous Infusions: .  ceFAZolin (ANCEF) IV 2 g (12/12/18 1412)     LOS: 10 days      Lynden Oxford, MD [facility] Pager:  [email or web address] Password [facility] 12/12/2018, 5:04 PM

## 2018-12-13 ENCOUNTER — Encounter (HOSPITAL_COMMUNITY): Payer: Self-pay | Admitting: General Practice

## 2018-12-13 ENCOUNTER — Other Ambulatory Visit: Payer: Self-pay

## 2018-12-13 ENCOUNTER — Inpatient Hospital Stay (HOSPITAL_COMMUNITY): Payer: Medicare Other

## 2018-12-13 MED ORDER — DAKINS (1/4 STRENGTH) 0.125 % EX SOLN
Freq: Two times a day (BID) | CUTANEOUS | Status: DC
  Administered 2018-12-13: 1 via TOPICAL
  Administered 2018-12-14: 10:00:00 via TOPICAL
  Filled 2018-12-13: qty 473

## 2018-12-13 NOTE — Progress Notes (Signed)
RUE PICC dressing change done per MD order without difficulty.

## 2018-12-13 NOTE — Discharge Instructions (Signed)
Thoracoscopy, Care After  This sheet gives you information about how to care for yourself after your procedure. Your health care provider may also give you more specific instructions. If you have problems or questions, contact your health care provider.  What can I expect after the procedure?  After the procedure, it is common to have pain and soreness in the surgical area.  Follow these instructions at home:  Incision care     Follow instructions from your health care provider about how to take care of your incision. Make sure you:  ? Wash your hands with soap and water before you change your bandage (dressing). If soap and water are not available, use hand sanitizer.  ? Change your dressing as told by your health care provider.  ? Leave stitches (sutures), skin glue, or adhesive strips in place. These skin closures may need to stay in place for 2 weeks or longer. If adhesive strip edges start to loosen and curl up, you may trim the loose edges. Do not remove adhesive strips completely unless your health care provider tells you to do that.   Check your incision areas every day for signs of infection. Check for:  ? Redness, swelling, or pain.  ? Fluid or blood.  ? Warmth.  ? Pus or a bad smell.   Do not take baths, swim, or use a hot tub until your health care provider approves. You may take showers.  Medicines   Take over-the-counter and prescription medicines only as told by your health care provider.   If you were prescribed an antibiotic medicine, take it as told by your health care provider. Do not stop taking the antibiotic even if you start to feel better.   Do not drive or use heavy machinery while taking prescription pain medicine.   If you are taking prescription pain medicine, take actions to prevent or treat constipation. Your health care provider may recommend that you:  ? Drink enough fluid to keep your urine pale yellow.  ? Eat foods that are high in fiber, such as fresh fruits and vegetables,  whole grains, and beans.  ? Limit foods that are high in fat and processed sugars, such as fried and sweet foods.  ? Take an over-the-counter or prescription medicine for constipation.  Managing pain, stiffness, and swelling     If directed, put ice on the affected area:  ? Put ice in a plastic bag.  ? Place a towel between your skin and the bag.  ? Leave the ice on for 20 minutes, 2-3 times a day.  Preventing lung infection   To prevent pneumonia and to keep your lungs healthy:  ? Try to cough often. If it hurts to cough, hold a pillow against your chest as you cough.  ? Take deep breaths or do breathing exercises as instructed by your health care provider.  ? If you were given an incentive spirometer, use it as directed by your health care provider.  General instructions   Do not lift anything that is heavier than 10 lb (4.5 kg), or the limit that you are told, until your health care provider says that it is safe.   Do not use any products that contain nicotine or tobacco, such as cigarettes and e-cigarettes. These can delay healing after surgery. If you need help quitting, ask your health care provider.   Avoid driving until your health care provider approves.   If you have a chest drainage tube, care for it   as instructed by your health care provider. Do not travel by airplane after the chest drainage tube is removed until your health care provider approves.   Keep all follow-up visits as told by your health care provider. This is important.  Contact a health care provider if:   You have a fever.   Pain medicines do not ease your pain.   You have redness, swelling, or increasing pain in your incision area.   You develop a cough that does not go away, or you are coughing up mucus that is yellow or green.  Get help right away if:   You have fluid, blood, or pus coming from your incision.   There is a bad smell coming from your incision or dressing.   You develop a rash.   You cough up blood.   You  develop light-headedness, or you feel faint.   You have difficulty breathing.   You develop chest pain.   Your heartbeat feels irregular or very fast.  These symptoms may represent a serious problem that is an emergency. Do not wait to see if the symptoms will go away. Get medical help right away. Call your local emergency services (911 in the [place] Do not drive yourself to the hospital.  Summary   Follow instructions from your health care provider about how to take care of your incision.   Do not drive or use heavy machinery while taking prescription pain medicine.   Leave stitches (sutures), skin glue, or adhesive strips in place.   Check your incision areas every day for signs of infection.  This information is not intended to replace advice given to you by your health care provider. Make sure you discuss any questions you have with your health care provider.  Document Released: 01/09/2005 Document Revised: 06/01/2017 Document Reviewed: 06/01/2017  Elsevier Interactive Patient Education  2019 [employer]

## 2018-12-13 NOTE — Progress Notes (Signed)
PROGRESS NOTE    Randy LiterRobert J Maddy   XBJ:[id number]  DOB: 15-Feb-1956  DOA: 12/02/2018 PCP: Karle PlumberArvind, Moogali M, MD   Brief Narrative:  Randy Page 63 y.o. male with medical history significant for hypertension, hyperlipidemia, recent GI bleed due to duodenal ulcer, anemia, chronic neck pain status post cervical fusion who is [facility] for further management of right-sided empyema and MSSA bacteremia. He presented to [name] after a fall at home. He was found to be hypoglycemia and hypotensive (SBP 80s) by EMS.  Lactic acid was 12.3 CT chest > large air and fluid collection in right hemithorax favored to represent a large empyema, complete collapse of RML and near compete collapse of RLL. Tree in bud opacities in RUL and ELL. Emphysematous changes.   He was [facility] from 10/31/18- 11/03/18 for GI bleed found to be due to a duodenal ulcer.   Subjective: No nausea no vomiting no fever no chills.  PT reports that the patient may have some bluish discharge exudate wound.  Assessment & Plan: MSSA bacteremia, HCAP,  empyema, acute hypoxic respiratory failure - underwent chest tube on 5/27 - plan at [name] for 3 wks of Cefazolin and Rifampin  - repeat blood cultures negative  - repeat pleural fluid cultures showing MSSA. - [facility] to evaluate - 6/1- underwent uncompliacated VATS by Dr Laneta SimmersBartle cultures are growing staph aureus pansensitive. - wean O2 as able - ID recommend PICC and 2 wks of Cefazolin and Rifampin after VATS- Have PICC.  Last day of antibiotic 12/18/2018.  Hypotension with h/o Essential hypertension - holding antihypertensives - Cosyntropin test ordered and administered but ACTH not drawn by lab Now blood pressure better.  Monitor.  Decubitus ulcer b/l but much larger on right buttock- present on admission - followed by wound care clinic as outpt - consulted general  surgery - underwent debridement after VATS in OR - added air mattress, currently getting hydrotherapy. - dakins solution added   Hypoalbuminemia, underweight Body mass index is 17.84 kg/m. - in context of current illness - have added Ensure and requested an nutrition eval- recommendations> TID Ensure  Elevated INR -Treated with Vit K x 3 days  - INR is noted to be improved  Hyperlipidemia - cont Atorvastatin  Hypokalemia replaced    Chronic pain in neck and back - s/p cervical fusion - cont Oxy IR 15 mg Q4 hrs PRN and Gabapentin  Duodenal ulcer with GI bleed s/p clipping 4/20 - cont Protonix  Anemia- normocytic -  Hb 7.4 -8.5 range -  no prior Hb to compare with -  anemia panel consistent with AOCD  Nicotine abuse - counseled to quit- he states he would like to but his girlfriend also smokes - cont Nicotine patch  Hyperthyroid -cont Tapazol  Deconditioning/ fall on admission - PT eval ordered- recommending HHPT  Time spent in minutes: 35 min DVT prophylaxis: SCDs Code Status: Full code Family Communication:  Disposition Plan: HHPT recommended Consultants:   CT surgery  General surgery Procedures:   Chest tube  VATS  I and D of decubitus Antimicrobials:  Anti-infectives (From admission, onward)   Start     Dose/Rate Route Frequency Ordered Stop   12/02/18 2330  ceFAZolin (ANCEF) IVPB 2g/100 mL premix     2 g 200 mL/hr over 30 Minutes Intravenous Every 8 hours 12/02/18 2322 12/18/18 2359   12/02/18 2330  rifampin (RIFADIN) capsule 300 mg  300 mg Oral Every 12 hours 12/02/18 2322 12/18/18 2359       Objective: Vitals:   12/13/18 0135 12/13/18 0417 12/13/18 0546 12/13/18 0822  BP: 135/80 124/71  129/70  Pulse: 93 90  96  Resp: 16   19  Temp: 99.6 F (37.6 C) 99 F (37.2 C)  99 F (37.2 C)  TempSrc: Oral Oral  Oral  SpO2: 99% 95%    Weight:   54.8 kg   Height:        Intake/Output Summary (Last 24 hours) at 12/13/2018 1859 Last data  filed at 12/13/2018 1256 Gross per 24 hour  Intake 1050 ml  Output 600 ml  Net 450 ml   Filed Weights   12/11/18 0600 12/12/18 0601 12/13/18 0546  Weight: 54.4 kg 54.7 kg 54.8 kg    Examination:  General exam: Appears comfortable  HEENT: PERRLA, oral mucosa moist, no sclera icterus or thrush Respiratory system: Crackles in RLL, bloody fluid in chest tube- Respiratory effort normal. Cardiovascular system: S1 & S2 heard,  No murmurs - tachycardic  Gastrointestinal system: Abdomen soft, non-tender, nondistended. Normal bowel sounds   Central nervous system: Alert and oriented. No focal neurological deficits. Extremities: No cyanosis, clubbing or edema Skin: dressing just changed by surgery and thus I did no re-open it today Psychiatry:  Mood & affect appropriate.  Psychiatry:  Mood & affect appropriate.   Data Reviewed: I have personally reviewed following labs and imaging studies  CBC: Recent Labs  Lab 12/07/18 0434 12/07/18 1630 12/11/18 0522  WBC 7.0 6.2 7.7  NEUTROABS  --   --  5.0  HGB 6.8* 8.7* 8.6*  HCT 23.2* 28.0* 27.9*  MCV 98.7 94.0 97.9  PLT 246 284 328   Basic Metabolic Panel: Recent Labs  Lab 12/07/18 0434 12/08/18 0519 12/11/18 0522  NA 135 136 138  K 3.8 3.4* 3.5  CL 98 96* 100  CO2 29 32 29  GLUCOSE 107* 131* 123*  BUN 8 <5* 7*  CREATININE 0.75 0.59* 0.68  CALCIUM 8.1* 8.5* 8.4*   GFR: Estimated Creatinine Clearance: 73.3 mL/min (by C-G formula based on SCr of 0.68 mg/dL). Liver Function Tests: Recent Labs  Lab 12/08/18 0519 12/11/18 0522  AST 16 18  ALT 6 6  ALKPHOS 100 101  BILITOT 0.6 0.5  PROT 6.2* 6.2*  ALBUMIN 1.7* 1.8*   No results for input(s): LIPASE, AMYLASE in the last 168 hours. No results for input(s): AMMONIA in the last 168 hours. Coagulation Profile: No results for input(s): INR, PROTIME in the last 168 hours. Cardiac Enzymes: No results for input(s): CKTOTAL, CKMB, CKMBINDEX, TROPONINI in the last 168 hours. BNP  (last 3 results) No results for input(s): PROBNP in the last 8760 hours. HbA1C: No results for input(s): HGBA1C in the last 72 hours. CBG: Recent Labs  Lab 12/07/18 1234  GLUCAP 98   Lipid Profile: No results for input(s): CHOL, HDL, LDLCALC, TRIG, CHOLHDL, LDLDIRECT in the last 72 hours. Thyroid Function Tests: No results for input(s): TSH, T4TOTAL, FREET4, T3FREE, THYROIDAB in the last 72 hours. Anemia Panel: No results for input(s): VITAMINB12, FOLATE, FERRITIN, TIBC, IRON, RETICCTPCT in the last 72 hours. Urine analysis: No results found for: COLORURINE, APPEARANCEUR, LABSPEC, PHURINE, GLUCOSEU, HGBUR, BILIRUBINUR, KETONESUR, PROTEINUR, UROBILINOGEN, NITRITE, LEUKOCYTESUR Sepsis Labs: @LABRCNTIP (procalcitonin:4,lacticidven:4) ) Recent Results (from the past 240 hour(s))  MRSA PCR Screening     Status: None   Collection Time: 12/04/18  8:33 PM  Result Value Ref Range Status  MRSA by PCR NEGATIVE NEGATIVE Final    Comment:        The GeneXpert MRSA Assay (FDA approved for NASAL specimens only), is one component of a comprehensive MRSA colonization surveillance program. It is not intended to diagnose MRSA infection nor to guide or monitor treatment for MRSA infections. Performed at [facility], [place] [address], [place], [place] [address]   Aerobic/Anaerobic Culture (surgical/deep wound)     Status: None   Collection Time: 12/05/18  3:36 PM  Result Value Ref Range Status   Specimen Description FLUID RIGHT LUNG  Final   Special Requests PATIENT ON FOLLOWING ANCEF  Final   Gram Stain   Final    ABUNDANT WBC PRESENT, PREDOMINANTLY PMN FEW GRAM POSITIVE COCCI    Culture   Final    RARE STAPHYLOCOCCUS AUREUS NO ANAEROBES ISOLATED Performed at [facility], [place] [address] [place], [place] [address]    Report Status 12/10/2018 FINAL  Final   Organism ID, Bacteria STAPHYLOCOCCUS AUREUS  Final      Susceptibility   Staphylococcus aureus - MIC*     CIPROFLOXACIN <=0.5 SENSITIVE Sensitive     ERYTHROMYCIN <=0.25 SENSITIVE Sensitive     GENTAMICIN <=0.5 SENSITIVE Sensitive     OXACILLIN <=0.25 SENSITIVE Sensitive     TETRACYCLINE <=1 SENSITIVE Sensitive     VANCOMYCIN 1 SENSITIVE Sensitive     TRIMETH/SULFA <=10 SENSITIVE Sensitive     CLINDAMYCIN <=0.25 SENSITIVE Sensitive     RIFAMPIN <=0.5 SENSITIVE Sensitive     Inducible Clindamycin NEGATIVE Sensitive     * RARE STAPHYLOCOCCUS AUREUS  Fungus Culture With Stain     Status: None (Preliminary result)   Collection Time: 12/05/18  3:49 PM  Result Value Ref Range Status   Fungus Stain Final report  Final    Comment: (NOTE) Performed At: [facility] [address] [place], [place] [id number] Rush Farmer MD QM:[id number]    Fungus (Mycology) Culture PENDING  Incomplete   Fungal Source TISSUE  Final    Comment: RIGHT LUNG Performed at [facility], [place] [address] [place], [place] [address]   Aerobic/Anaerobic Culture (surgical/deep wound)     Status: None   Collection Time: 12/05/18  3:49 PM  Result Value Ref Range Status   Specimen Description TISSUE RIGHT LUNG  Final   Special Requests PATIENT ON FOLLOWING ANCEF  Final   Gram Stain   Final    ABUNDANT WBC PRESENT, PREDOMINANTLY PMN ABUNDANT GRAM POSITIVE COCCI    Culture   Final    RARE STAPHYLOCOCCUS AUREUS NO ANAEROBES ISOLATED Performed at [facility], [place] [address] [place], [place] [address]    Report Status 12/10/2018 FINAL  Final   Organism ID, Bacteria STAPHYLOCOCCUS AUREUS  Final      Susceptibility   Staphylococcus aureus - MIC*    CIPROFLOXACIN <=0.5 SENSITIVE Sensitive     ERYTHROMYCIN <=0.25 SENSITIVE Sensitive     GENTAMICIN <=0.5 SENSITIVE Sensitive     OXACILLIN <=0.25 SENSITIVE Sensitive     TETRACYCLINE <=1 SENSITIVE Sensitive     VANCOMYCIN <=0.5 SENSITIVE Sensitive     TRIMETH/SULFA <=10 SENSITIVE Sensitive     CLINDAMYCIN <=0.25 SENSITIVE Sensitive     RIFAMPIN  <=0.5 SENSITIVE Sensitive     Inducible Clindamycin NEGATIVE Sensitive     * RARE STAPHYLOCOCCUS AUREUS  Acid Fast Smear (AFB)     Status: None   Collection Time: 12/05/18  3:49 PM  Result  Value Ref Range Status   AFB Specimen Processing Comment  Final    Comment: Tissue Grinding and Digestion/Decontamination   Acid Fast Smear Negative  Final    Comment: (NOTE) Performed At: [facility] [address] [place], [place] [id number] Jolene SchimkeNagendra Sanjai MD WU:[id number]:[phone]    Source (AFB) TISSUE  Final    Comment: RIGHT LUNG Performed at [facility], [address] [place], [place] [address]   Fungus Culture Result     Status: None   Collection Time: 12/05/18  3:49 PM  Result Value Ref Range Status   Result 1 Comment  Final    Comment: (NOTE) KOH/Calcofluor preparation:  no fungus observed. Performed At: [facility] [address] [place], [place] [id number] Jolene SchimkeNagendra Sanjai MD [id number]:[phone]          Radiology Studies: Dg Chest Port 1 View  Result Date: 12/13/2018 CLINICAL DATA:  Shortness of breath, pleural effusion EXAM: PORTABLE CHEST 1 VIEW COMPARISON:  12/12/2018 FINDINGS: Right PICC line and right basilar chest tube remain in place, unchanged. Improving airspace disease at the right lung base. Small residual right pleural effusion. No visible pneumothorax. Hyperinflation of the lungs. Heart is normal size. No acute bony abnormality. IMPRESSION: Improving right basilar airspace opacity, likely improving atelectasis. Small right pleural effusion. No visible pneumothorax. Hyperinflation. Electronically Signed   By: Charlett NoseKevin  Dover M.D.   On: 12/13/2018 08:41   Dg Chest Port 1 View  Result Date: 12/12/2018 CLINICAL DATA:  63 year old male with a history of empyema, status post VATS 12/05/2018, with shortness of breath and pneumothorax. EXAM: PORTABLE CHEST 1 VIEW COMPARISON:  12/11/2018, 12/10/2018, 12/09/2018 FINDINGS: Cardiomediastinal silhouette unchanged in size  and contour. Unchanged thoracostomy tube of the right chest, terminating at the costophrenic angle. Persisting pleuroparenchymal thickening at the right lung base laterally. No visualized pneumothorax. Patchy opacity at the right lung base persist. Thickening of the minor fissure persist. Left lung well aerated. Surgical changes of the cervical region. Right upper extremity PICC is unchanged. IMPRESSION: Unchanged position of right thoracostomy tube with similar appearance of pleuroparenchymal thickening at the lung base, potentially combination of postoperative changes, and/or persisting pleural fluid. No visualized pneumothorax on the current plain film. Patchy airspace opacity at the right lung base persist. Unchanged right upper extremity PICC. Electronically Signed   By: Gilmer MorJaime  Wagner D.O.   On: 12/12/2018 08:00      Scheduled Meds: . sodium chloride   Intravenous Once  . atorvastatin  20 mg Oral QHS  . bisacodyl  10 mg Oral Daily  . doxepin  100 mg Oral QHS  . feeding supplement (ENSURE ENLIVE)  237 mL Oral TID BM  . feeding supplement (PRO-STAT SUGAR FREE 64)  30 mL Oral BID  . gabapentin  600 mg Oral QID  . multivitamin with minerals  1 tablet Oral Daily  . nicotine  14 mg Transdermal Daily  . pantoprazole  40 mg Oral BID AC  . rifampin  300 mg Oral Q12H  . senna-docusate  1 tablet Oral QHS  . sodium chloride flush  10-40 mL Intracatheter Q12H  . sodium hypochlorite   Topical BID  . tiZANidine  4 mg Oral QHS   Continuous Infusions: .  ceFAZolin (ANCEF) IV 2 g (12/13/18 1338)     LOS: 11 days      Lynden OxfordPranav Patel, MD [facility] Pager: [email or web address] Password [email or web address] 12/13/2018, 6:59 PM

## 2018-12-13 NOTE — Progress Notes (Addendum)
      [address]       [place] [address]             [phone]       8 Days Post-Op Procedure(s) (LRB): RIGHT VIDEO ASSISTED THORACOSCOPY DRAIN EMPYEMA/LUNG ABSCESS (Right) Excisional Debridement Right Sacral Wound (Right)  Subjective: Patient waiting for breakfast tray. He states he has noticed no change in his breathing since being placed to water seal.  Objective: Vital signs in last 24 hours: Temp:  [98.3 F (36.8 C)-99.6 F (37.6 C)] 99 F (37.2 C) (06/09 0417) Pulse Rate:  [84-93] 90 (06/09 0417) Cardiac Rhythm: Normal sinus rhythm (06/08 2137) Resp:  [16-18] 16 (06/09 0135) BP: (115-157)/(66-89) 124/71 (06/09 0417) SpO2:  [95 %-100 %] 95 % (06/09 0417) Weight:  [54.8 kg] 54.8 kg (06/09 0546)     Intake/Output from previous day: 06/08 0701 - 06/09 0700 In: 610 [P.O.:600; I.V.:10] Out: 1200 [Urine:1075; Chest Tube:125]   Physical Exam:  Cardiovascular: RRR Pulmonary: Diminished breath sounds bilaterally Abdomen: Soft, non tender, bowel sounds present. Extremities: No LE edema Wounds: Dressing is clean and dry.   Chest Tube: to water seal;persistent 3+ air leak   Lab Results: CBC: Recent Labs    12/11/18 0522  WBC 7.7  HGB 8.6*  HCT 27.9*  PLT 328   BMET:  Recent Labs    12/11/18 0522  NA 138  K 3.5  CL 100  CO2 29  GLUCOSE 123*  BUN 7*  CREATININE 0.68  CALCIUM 8.4*    PT/INR:  No results for input(s): LABPROT, INR in the last 72 hours. ABG:  INR: Will add last result for INR, ABG once components are confirmed Will add last 4 CBG results once components are confirmed  Assessment/Plan:  1. CV - SR in the 80-90's. 2.  Pulmonary - On room air. Chest tube with 125 cc last 24 hours. Chest tube is to suction and there continues to be a persistent +3 air leak (likely had lung abscess). CXR this am appears stable. Will discuss with Dr. Cyndia Bent if should transition to a mini express. Encourage incentive spirometer. Gram  stain of pleural fluid showed gram positive cocci; culture shows rare Staph Aureus 3. ID- on Cefazolin 2 grams IV tid and Rifampin 300 mg orally bid 4. Anemia- Last H and H stable at 8.6 and 27.9 (previous transfusion) 5. Sacral wound-s/p debridement, dressing changes per general surgery 6. Deconditioning-continue with PT 7. Will arrange [facility], needs a second mini express, and must make sure nurse has Leur lock syringe to evacuate excess drainage from mini express (after discharge). 8. Management per primary and general surgery  Donielle M ZimmermanPA-C 12/13/2018,7:06 AM [phone]   Chart reviewed, patient examined, agree with above. CXR looks stable. There may be a small basilar space but lung expansion looks good and basilar airspace disease improving. I think we can switch to mini-express. He still has an air leak and that may take weeks to stop so he can go home with mini-express and follow up in the office. Will need dressing change around the tube daily.

## 2018-12-13 NOTE — Progress Notes (Signed)
Physical Therapy Wound Treatment Patient Details  Name: DONNEL VENUTO MRN: [id number] Date of Birth: 08-Mar-1956  Today's Date: 12/13/2018 Time: 1250-1326 Time Calculation (min): 36 min  Subjective  Patient and Family Stated Goals: healed up some and home to continue Prior Treatments: surgical debridement 6/1  Pain Score: Pain Score: 4/10  Wound Assessment  Pressure Injury 12/02/18 Buttocks Right Unstageable - Full thickness tissue loss in which the base of the ulcer is covered by slough (yellow, tan, gray, green or brown) and/or eschar (tan, brown or black) in the wound bed. (Active)  Wound Image   12/13/2018  2:09 PM  Dressing Type Barrier Film (skin prep);Gauze (Comment);Moist to dry 12/13/2018  2:09 PM  Dressing Changed;Clean 12/13/2018  2:09 PM  Dressing Change Frequency Daily 12/13/2018  2:09 PM  State of Healing Eschar 12/13/2018  2:09 PM  Site / Wound Assessment Brown;Yellow 12/13/2018  2:09 PM  % Wound base Red or Granulating 10% 12/13/2018  2:09 PM  % Wound base Yellow/Fibrinous Exudate 90% 12/13/2018  2:09 PM  Peri-wound Assessment Erythema (blanchable) 12/13/2018  2:09 PM  Wound Length (cm) 9 cm 12/13/2018  2:09 PM  Wound Width (cm) 4 cm 12/13/2018  2:09 PM  Wound Depth (cm) 2 cm 12/13/2018  2:09 PM  Wound Surface Area (cm^2) 36 cm^2 12/13/2018  2:09 PM  Wound Volume (cm^3) 72 cm^3 12/13/2018  2:09 PM  Margins Unattached edges (unapproximated) 12/13/2018  2:09 PM  Drainage Amount Moderate 12/13/2018  2:09 PM  Drainage Description Serosanguineous 12/13/2018  2:09 PM  Treatment Cleansed;Debridement (Selective);Hydrotherapy (Pulse lavage);Packing (Saline gauze) 12/13/2018  2:09 PM     Hydrotherapy Pulsed lavage therapy - wound location: R buttock/sacral Pulsed Lavage with Suction (psi): 8 psi(to 12) Pulsed Lavage with Suction - Normal Saline Used: 1000 mL Pulsed Lavage Tip: Tip with splash shield Selective Debridement Selective Debridement - Location: R buttock/sacrum Selective Debridement - Tools Used:  Forceps;Scalpel;Scissors Selective Debridement - Tissue Removed: Thick yellow eschar   Wound Assessment and Plan  Wound Therapy - Assess/Plan/Recommendations Wound Therapy - Clinical Statement: This wound needs hydrotherapy for PLS to soften this thick eschar for selective debridement.  Not yet stageable and not sure if small left sites will end up a part of 1 wound.  The left sites were not addressed formally on evaluation Wound Therapy - Functional Problem List: balance,stability issues Factors Delaying/Impairing Wound Healing: Multiple medical problems;Infection - systemic/local Hydrotherapy Plan: Debridement;Dressing change;Patient/family education;Pulsatile lavage with suction Wound Therapy - Frequency: 6X / week Wound Therapy - Current Recommendations: PT Wound Therapy - Follow Up Recommendations: Home health RN Wound Plan: see above  Wound Therapy Goals- Improve the function of patient's integumentary system by progressing the wound(s) through the phases of wound healing (inflammation - proliferation - remodeling) by: Decrease Necrotic Tissue to: 25% Decrease Necrotic Tissue - Progress: Progressing toward goal Increase Granulation Tissue to: 75% Increase Granulation Tissue - Progress: Progressing toward goal Goals/treatment plan/discharge plan were made with and agreed upon by patient/family: Yes Time For Goal Achievement: 7 days Wound Therapy - Potential for Goals: Good  Goals will be updated until maximal potential achieved or discharge criteria met.  Discharge criteria: when goals achieved, discharge from hospital, MD decision/surgical intervention, no progress towards goals, refusal/missing three consecutive treatments without notification or medical reason.  GP     Tessie Fass Adyen Bifulco 12/13/2018, 2:10 PM  12/13/2018  Donnella Sham, PT Acute Rehabilitation Services [phone]  (pager) [phone]  (office)

## 2018-12-14 ENCOUNTER — Inpatient Hospital Stay (HOSPITAL_COMMUNITY): Payer: Medicare Other

## 2018-12-14 LAB — GLUCOSE, CAPILLARY: Glucose-Capillary: 137 mg/dL — ABNORMAL HIGH (ref 70–99)

## 2018-12-14 MED ORDER — OXYCODONE HCL 15 MG PO TABS: 15.0000 mg | ORAL_TABLET | Freq: Four times a day (QID) | ORAL | 0 refills | Status: AC | PRN

## 2018-12-14 MED ORDER — ONDANSETRON HCL 4 MG PO TABS: 4.0000 mg | ORAL_TABLET | Freq: Four times a day (QID) | ORAL | 0 refills | Status: DC | PRN

## 2018-12-14 MED ORDER — DOCUSATE SODIUM 100 MG PO CAPS: 100.0000 mg | ORAL_CAPSULE | Freq: Two times a day (BID) | ORAL | 0 refills | Status: DC | PRN

## 2018-12-14 MED ORDER — PRO-STAT SUGAR FREE PO LIQD: 30.0000 mL | Freq: Two times a day (BID) | ORAL | 0 refills | Status: DC

## 2018-12-14 MED ORDER — SILVER NITRATE-POT NITRATE 75-25 % EX MISC: 1.0000 | CUTANEOUS | 0 refills | Status: DC | PRN

## 2018-12-14 MED ORDER — CEFAZOLIN IV (FOR PTA / DISCHARGE USE ONLY): 2.0000 g | Freq: Three times a day (TID) | INTRAVENOUS | 0 refills | Status: AC

## 2018-12-14 MED ORDER — NICOTINE 14 MG/24HR TD PT24: 14.0000 mg | MEDICATED_PATCH | Freq: Every day | TRANSDERMAL | 0 refills | Status: DC

## 2018-12-14 MED ORDER — RIFAMPIN 300 MG PO CAPS: 300.0000 mg | ORAL_CAPSULE | Freq: Two times a day (BID) | ORAL | 0 refills | Status: AC

## 2018-12-14 MED ORDER — ENSURE ENLIVE PO LIQD: 237.0000 mL | Freq: Three times a day (TID) | ORAL | 12 refills | Status: DC

## 2018-12-14 MED ORDER — DAKINS (1/4 STRENGTH) 0.125 % EX SOLN: Freq: Two times a day (BID) | CUTANEOUS | 0 refills | Status: DC

## 2018-12-14 MED ORDER — HEPARIN SOD (PORK) LOCK FLUSH 100 UNIT/ML IV SOLN
250.0000 [IU] | INTRAVENOUS | Status: AC | PRN
  Administered 2018-12-14: 250 [IU]

## 2018-12-14 NOTE — TOC Transition Note (Addendum)
Transition of Care [facility]) - CM/SW Discharge Note Marvetta Gibbons RN, BSN Transitions of Care Unit 4E- RN Case Manager [phone]   Patient Details  Name: Randy Page MRN: [id number] Date of Birth: 06-Aug-1955  Transition of Care [facility]) CM/SW Contact:  Dawayne Patricia, RN Phone Number: 12/14/2018, 11:29 AM   Clinical Narrative:    Pt stable for transition home today, CT has been changed to mini express (CVTS aware that there is a shortage of mini express and pt will not go home with "extra" mini express- bedside RN will however send 2 syringes home with pt)- CM has spoken with Tommi Rumps at [facility] regarding CT mini express needs and syringes being sent home with pt. Pam with [employer] has been in contact with pt's SO - who will be coming to hospital for education on IV abx needs- Pt has requested transport via non emergent EMS for safety- CM has explained to pt insurance by not cover cost and pt may receive bill - pt voices understanding and still wants EMS transport home. [place] arrangements in place with [facility] coordinating with [employer] for IV abx needs. Pt also requesting RW for home, order has been placed, call made to [facility] with [employer] for DME need- RW to be delivered to room prior to discharge.    Final next level of care: [place] Barriers to Discharge: No Barriers Identified   Patient Goals and CMS Choice Patient states their goals for this hospitalization and ongoing recovery are:: "to get well, to get stronger, to be able to walk better" CMS Medicare.gov Compare Post Acute Care list provided to:: Patient Choice offered to / list presented to : Patient  Discharge Placement  Home with [facility]                     Discharge Plan and Services   Discharge Planning Services: CM Consult Post Acute Care Choice: Home Health, Durable Medical Equipment          DME Arranged: [place] rolling DME Agency: [employer] Date DME Agency Contacted: 12/14/18 Time DME Agency  Contacted: [id number] Representative spoke with at DME Agency: Thedore Mins blank HH Arranged: RN, PT [place] Agency: [facility]([employer] for IV needs) Date [place]: 12/14/18 Time [place]: 1125 Representative spoke with at [place]: [place] ([place]) Interventions     Readmission Risk Interventions Readmission Risk Prevention Plan 12/14/2018  Post Dischage Appt Complete  Medication Screening Complete  Transportation Screening Complete  Some recent data might be hidden

## 2018-12-14 NOTE — Progress Notes (Signed)
Physical Therapy Treatment Patient Details Name: Randy Page MRN: [id number] DOB: 09/05/1955 Today's Date: 12/14/2018    History of Present Illness Randy Page 62 y.o. male with medical history significant for hypertension, hyperlipidemia, recent GI bleed due to duodenal ulcer, anemia, chronic neck pain status post cervical fusion who is transferred to [place] from [facility] for further management of right-sided empyema and MSSA bacteremia. s/p EXCISIONAL DEBRIDEMENT OF RIGHT SACRAL WOUND on 12/05/18    PT Comments    Patient seen prior to d/c home. Patient performing bed mobility and functional transfers at supervision level. Assist needed for dressing with cueing for safety and balance. RW delivered to room with PT fitting patient to correct height for home use. Education on home safety as well as offloading weight of pressure injury at buttocks with good verbal understanding. Patient declined ambulation and stair training with education provided on safety. Patient returned to supine with all needs met.    Follow Up Recommendations  Home health PT;Supervision for mobility/OOB     Equipment Recommendations  Rolling walker with 5" wheels    Recommendations for Other Services       Precautions / Restrictions Precautions Precautions: Fall Restrictions Weight Bearing Restrictions: No    Mobility  Bed Mobility Overal bed mobility: Modified Independent                Transfers Overall transfer level: Needs assistance Equipment used: Rolling walker (2 wheeled) Transfers: Sit to/from Stand Sit to Stand: Supervision         General transfer comment: for safety  Ambulation/Gait             General Gait Details: patient declined   Stairs             Wheelchair Mobility    Modified Rankin (Stroke Patients Only)       Balance Overall balance assessment: Needs assistance Sitting-balance support: No upper extremity supported;Feet  supported Sitting balance-Leahy Scale: Good     Standing balance support: Bilateral upper extremity supported Standing balance-Leahy Scale: Fair                              Cognition Arousal/Alertness: Awake/alert Behavior During Therapy: WFL for tasks assessed/performed;Anxious Overall Cognitive Status: Within Functional Limits for tasks assessed                                        Exercises      General Comments        Pertinent Vitals/Pain Pain Assessment: Faces Faces Pain Scale: Hurts little more Pain Location: chest tube site and buttocks Pain Descriptors / Indicators: Aching;Discomfort;Grimacing;Guarding Pain Intervention(s): Limited activity within patient's tolerance;Monitored during session;Repositioned    Home Living                      Prior Function            PT Goals (current goals can now be found in the care plan section) Acute Rehab PT Goals Patient Stated Goal: Get well PT Goal Formulation: With patient Time For Goal Achievement: 12/17/18 Potential to Achieve Goals: Good Progress towards PT goals: Progressing toward goals    Frequency    Min 3X/week      PT Plan Current plan remains appropriate    Co-evaluation                AM-PAC PT "6 Clicks" Mobility   Outcome Measure  Help needed turning from your back to your side while in a flat bed without using bedrails?: None Help needed moving from lying on your back to sitting on the side of a flat bed without using bedrails?: None Help needed moving to and from a bed to a chair (including a wheelchair)?: A Little Help needed standing up from a chair using your arms (e.g., wheelchair or bedside chair)?: A Little Help needed to walk in hospital room?: A Little Help needed climbing 3-5 steps with a railing? : A Little 6 Click Score: 20    End of Session   Activity Tolerance: Patient tolerated treatment well Patient left: in bed;with call  bell/phone within reach Nurse Communication: Mobility status PT Visit Diagnosis: Unsteadiness on feet (R26.81)     Time: 8099-8338 PT Time Calculation (min) (ACUTE ONLY): 15 min  Charges:  $Therapeutic Activity: 8-22 mins                     Lanney Gins, PT, DPT Supplemental Physical Therapist 12/14/18 2:22 PM Pager: [phone] Office: [phone]

## 2018-12-14 NOTE — Progress Notes (Signed)
PTAR called for transport as per pt request, pt's SO at bedside with [facility] for IV abx education. All HH arrangements in place. Paperwork for PTAR placed on shadowchart- Pt notified that transport has been called and scheduled for 2pm.

## 2018-12-14 NOTE — Progress Notes (Signed)
Physical Therapy Wound Treatment Patient Details  Name: TORIANO AIKEY MRN: [id number] Date of Birth: [birth year]  Today's Date: 12/14/2018 Time: 1240-1316 Time Calculation (min): 36 min  Subjective  Patient and Family Stated Goals: healed up some and home to continue Prior Treatments: surgical debridement 6/1  Pain Score: Pain Score: 10-Worst pain ever, premedicated  Wound Assessment  Pressure Injury 12/02/18 Buttocks Right Unstageable - Full thickness tissue loss in which the base of the ulcer is covered by slough (yellow, tan, gray, green or brown) and/or eschar (tan, brown or black) in the wound bed. (Active)  Wound Image   12/13/2018  2:09 PM  Dressing Type Barrier Film (skin prep);Gauze (Comment);Moist to dry;Other (Comment) 12/14/2018  1:55 PM  Dressing Changed;Clean 12/14/2018  1:55 PM  Dressing Change Frequency Daily 12/14/2018  1:55 PM  State of Healing Eschar 12/14/2018  1:55 PM  Site / Wound Assessment Brown;Yellow 12/14/2018  1:55 PM  % Wound base Red or Granulating 10% 12/14/2018  1:55 PM  % Wound base Yellow/Fibrinous Exudate 90% 12/14/2018  1:55 PM  Peri-wound Assessment Erythema (blanchable) 12/14/2018  1:55 PM  Wound Length (cm) 9 cm 12/13/2018  2:09 PM  Wound Width (cm) 4 cm 12/13/2018  2:09 PM  Wound Depth (cm) 2 cm 12/13/2018  2:09 PM  Wound Surface Area (cm^2) 36 cm^2 12/13/2018  2:09 PM  Wound Volume (cm^3) 72 cm^3 12/13/2018  2:09 PM  Margins Unattached edges (unapproximated) 12/14/2018  1:55 PM  Drainage Amount Moderate 12/14/2018  1:55 PM  Drainage Description Serosanguineous 12/14/2018  1:55 PM  Treatment Cleansed;Debridement (Selective);Hydrotherapy (Pulse lavage);Packing (Saline gauze) 12/14/2018  1:55 PM      Hydrotherapy Pulsed lavage therapy - wound location: R buttock/sacral Pulsed Lavage with Suction (psi): 8 psi(to 12) Pulsed Lavage with Suction - Normal Saline Used: 1000 mL Pulsed Lavage Tip: Tip with splash shield Selective Debridement Selective Debridement -  Location: R buttock/sacrum Selective Debridement - Tools Used: Forceps;Scalpel;Scissors Selective Debridement - Tissue Removed: Thick yellow eschar   Wound Assessment and Plan  Wound Therapy - Assess/Plan/Recommendations Wound Therapy - Clinical Statement: We have finally gotten DAKIN for bluish colored exudate.  Pt's debridement progressing, with pt's pain limiting progression.  Basic dressing change instruction completed with the patient. Wound Therapy - Functional Problem List: balance,stability issues Factors Delaying/Impairing Wound Healing: Multiple medical problems;Infection - systemic/local Hydrotherapy Plan: Debridement;Dressing change;Patient/family education;Pulsatile lavage with suction Wound Therapy - Frequency: 6X / week Wound Therapy - Current Recommendations: PT Wound Therapy - Follow Up Recommendations: Home health RN Wound Plan: see above  Wound Therapy Goals- Improve the function of patient's integumentary system by progressing the wound(s) through the phases of wound healing (inflammation - proliferation - remodeling) by: Decrease Necrotic Tissue to: 25% Decrease Necrotic Tissue - Progress: Progressing toward goal Increase Granulation Tissue to: 75% Increase Granulation Tissue - Progress: Progressing toward goal Goals/treatment plan/discharge plan were made with and agreed upon by patient/family: Yes Time For Goal Achievement: 7 days Wound Therapy - Potential for Goals: Good  Goals will be updated until maximal potential achieved or discharge criteria met.  Discharge criteria: when goals achieved, discharge from hospital, MD decision/surgical intervention, no progress towards goals, refusal/missing three consecutive treatments without notification or medical reason.  GP     Tessie Fass Maliki Gignac 12/14/2018, 2:00 PM 12/14/2018  Donnella Sham, PT Acute Rehabilitation Services [phone]  (pager) [phone]  (office)

## 2018-12-14 NOTE — Progress Notes (Addendum)
      [address]       [place], [address]             [phone]       9 Days Post-Op Procedure(s) (LRB): RIGHT VIDEO ASSISTED THORACOSCOPY DRAIN EMPYEMA/LUNG ABSCESS (Right) Excisional Debridement Right Sacral Wound (Right)  Subjective: Patient states "but hurts".  Objective: Vital signs in last 24 hours: Temp:  [97.5 F (36.4 C)-100.1 F (37.8 C)] 98.4 F (36.9 C) (06/10 0553) Pulse Rate:  [93-109] 93 (06/10 0553) Cardiac Rhythm: Sinus tachycardia;Supraventricular tachycardia (06/09 2019) Resp:  [18-20] 20 (06/10 0553) BP: (89-137)/(63-70) 137/63 (06/10 0553) SpO2:  [96 %-100 %] 99 % (06/10 0553) Weight:  [55.2 kg] 55.2 kg (06/10 0553)     Intake/Output from previous day: 06/09 0701 - 06/10 0700 In: 1040 [P.O.:140; IV Piggyback:900] Out: 0    Physical Exam:  Cardiovascular: RRR Pulmonary: Diminished breath sounds bilaterally Abdomen: Soft, non tender, bowel sounds present. Extremities: No LE edema Wounds: Dressing is clean and dry.   Chest Tube: to water seal;persistent air leak   Lab Results: CBC: No results for input(s): WBC, HGB, HCT, PLT in the last 72 hours. BMET:  No results for input(s): NA, K, CL, CO2, GLUCOSE, BUN, CREATININE, CALCIUM in the last 72 hours.  PT/INR:  No results for input(s): LABPROT, INR in the last 72 hours. ABG:  INR: Will add last result for INR, ABG once components are confirmed Will add last 4 CBG results once components are confirmed  Assessment/Plan:  1. CV - SR in the 90's. 2.  Pulmonary - On room air. Chest tube with scant output last 24 hours. Chest tube is to suction and there continues to be a persistent air leak (likely had lung abscess). Will place chest tube to mini express. CXR this am not taken yet. Encourage incentive spirometer. Gram stain of pleural fluid showed gram positive cocci; culture shows rare Staph Aureus 3. ID- on Cefazolin 2 grams IV tid and Rifampin 300 mg orally bid 4.  Anemia- Last H and H stable at 8.6 and 27.9 (previous transfusion) 5. Sacral wound-s/p debridement, dressing changes per general surgery 6. Deconditioning-continue with PT 7. I arranged [place], needs a second mini express, and must make sure nurse has Leur lock syringe to evacuate excess drainage from mini express (after discharge) as well as have a second mini express to take home. 8. Management per primary and general surgery  Donielle M ZimmermanPA-C 12/14/2018,7:04 AM [phone]    Chart reviewed, patient examined, agree with above. CXR looks fairly stable with no ptx seen.

## 2018-12-14 NOTE — Progress Notes (Signed)
Order received to discharge patient.  CT switched to mini-express drainage system.  Two 33ml Luer-lock syringes sent home with patient along with instructions to have nurse empty drainage system when necessary.  Telemetry monitor removed and CCMD notified.  IV team called regarding PICC preparation for home.  Discharge instructions, follow up, medications and instructions for their use discussed with patient. PTAR called for patient transport to home.

## 2018-12-14 NOTE — Progress Notes (Signed)
[facility]/Trauma Progress Note  9 Days Post-Op   Assessment/Plan R Empyema - S/P VATS, Dr. Cyndia Bent, 06/01  L buttock/sacral decub -S/P excisional debridement of right sacral wound, Dr. Redmond Pulling, 06/01 - wet to dry dressing changes qshift, pink dressing to other areas - antibiotics per ID - continue hydrotherapy and dakins  Follow up:[facility] 2-3 weeks for wound check. We will see again on Friday if pt is still here. Pt states girlfriend with help with dressing changes at home.    LOS: 12 days    Subjective: CC: bottom pain  He states his bottom gets more sore after hydrotherapy. He is ready to go home.   Objective: Vital signs in last 24 hours: Temp:  [97.5 F (36.4 C)-100.1 F (37.8 C)] 98 F (36.7 C) (06/10 0748) Pulse Rate:  [92-109] 92 (06/10 0748) Resp:  [18-20] 18 (06/10 0748) BP: (89-137)/(63-68) 123/68 (06/10 0748) SpO2:  [96 %-100 %] 99 % (06/10 0553) Weight:  [55.2 kg] 55.2 kg (06/10 0553) Last BM Date: 12/13/18  Intake/Output from previous day: 06/09 0701 - 06/10 0700 In: 1040 [P.O.:140; IV Piggyback:900] Out: 0  Intake/Output this shift: Total I/O In: 160 [P.O.:160] Out: -   PE: Gen: Alert, NAD, pleasant, cooperative Pulm:Rate andeffort normal GU: sacral wound looks clean and is without purulent drainage,improved erythema,no TTP,moderate slough at wound base but improved from Monday. see photo below Skin: no rashes noted, warm and dry      Anti-infectives: Anti-infectives (From admission, onward)   Start     Dose/Rate Route Frequency Ordered Stop   12/02/18 2330  ceFAZolin (ANCEF) IVPB 2g/100 mL premix     2 g 200 mL/hr over 30 Minutes Intravenous Every 8 hours 12/02/18 2322 12/18/18 2359   12/02/18 2330  rifampin (RIFADIN) capsule 300 mg     300 mg Oral Every 12 hours 12/02/18 2322 12/18/18 2359      Lab Results:  No results for input(s): WBC, HGB, HCT, PLT in the last 72 hours. BMET No results for input(s):  NA, K, CL, CO2, GLUCOSE, BUN, CREATININE, CALCIUM in the last 72 hours. PT/INR No results for input(s): LABPROT, INR in the last 72 hours. CMP     Component Value Date/Time   NA 138 12/11/2018 0522   NA 139 09/15/2017 1040   K 3.5 12/11/2018 0522   CL 100 12/11/2018 0522   CO2 29 12/11/2018 0522   GLUCOSE 123 (H) 12/11/2018 0522   BUN 7 (L) 12/11/2018 0522   BUN 5 (L) 09/15/2017 1040   CREATININE 0.68 12/11/2018 0522   CALCIUM 8.4 (L) 12/11/2018 0522   PROT 6.2 (L) 12/11/2018 0522   ALBUMIN 1.8 (L) 12/11/2018 0522   AST 18 12/11/2018 0522   ALT 6 12/11/2018 0522   ALKPHOS 101 12/11/2018 0522   BILITOT 0.5 12/11/2018 0522   GFRNONAA >60 12/11/2018 0522   GFRAA >60 12/11/2018 0522   Lipase  No results found for: LIPASE  Studies/Results: Dg Chest Port 1 View  Result Date: 12/13/2018 CLINICAL DATA:  Shortness of breath, pleural effusion EXAM: PORTABLE CHEST 1 VIEW COMPARISON:  12/12/2018 FINDINGS: Right PICC line and right basilar chest tube remain in place, unchanged. Improving airspace disease at the right lung base. Small residual right pleural effusion. No visible pneumothorax. Hyperinflation of the lungs. Heart is normal size. No acute bony abnormality. IMPRESSION: Improving right basilar airspace opacity, likely improving atelectasis. Small right pleural effusion. No visible pneumothorax. Hyperinflation. Electronically Signed   By: Rolm Baptise M.D.  On: 12/13/2018 08:41      Jerre SimonJessica L Jacky Hartung , [facility] Florida-Ft LauderdaleA-C [facility] 12/14/2018, 9:02 AM  Pager: [phone] Mon-Wed, Friday 7:00am-4:30pm Thurs 7am-11:30am  Consults: [phone]

## 2018-12-16 NOTE — Discharge Summary (Signed)
[facility] Discharge Summary   Patient: Randy Page YHC:[id number]   PCP: [name], MD DOB: 1956-01-29   Date of admission: 12/02/2018   Date of discharge: 12/14/2018     Discharge Diagnoses:  Principal Problem:   MSSA bacteremia Active Problems:   Essential hypertension   Cigarette smoker   Hyperlipidemia   Empyema of right pleural space (HCC)   Chronic pain   Duodenal ulcer   Decubitus ulcer   Elevated INR   Empyema ([place])   Admitted From: home > RH> [facility] Disposition:  Home with home health  Recommendations for Outpatient Follow-up:  1. Please follow up with PCP, cardiothoracic surgery and General surgery    Follow-up Information    Gaye Pollack, MD. Go on 12/19/2018.   Specialty: Cardiothoracic Surgery Why: PA/LAT CXR to be taken (at [facility] which is in the same building as Dr. Vivi Martens office) on 06/15 at 3:30 pm;Appointment time is at 4:00 pm Contact information: [place] [address] [phone]        [place], FNP Follow up.   Specialties: Family Medicine, Infectious Diseases Why: 12/19/2018 @ 9:30 AM - please call to reschedule if you are unable to make this appointment.  Contact information: [address] [place] [address] [phone]        [name], MD. Schedule an appointment as soon as possible for a visit in 1 week(s).   Specialty: Internal Medicine Contact information: [place] [address] [place], [facility] Follow up.   Specialty: Home Health Services Why: HHRN/PT- for wound care and chest tube needs  ([facility] will coordinate pharmacy needs for home IV abx needs)-  Contact information: [place] [address] [phone]        [facility], [place]. Schedule an appointment as soon as possible for a visit in 2 week(s).   Specialty: General Surgery Why: we are working  on a follow up appointment for you please call our office to see when your appointment is Contact information: [place] [phone]         Diet recommendation: cardiac diet  Activity: The patient is advised to gradually reintroduce usual activities,as tolerated.  Discharge Condition: good  Code Status: full code  History of present illness: As per the H and P dictated on admission, "Randy Page is a 63 y.o. male with medical history significant for hypertension, hyperlipidemia, recent GI bleed due to duodenal ulcer, anemia, chronic neck pain status post cervical fusion who is transferred to [facility] hospital from [facility] for further management of right-sided empyema.  Patient had recent hospitalization from 4/27-4/30/2020 at [facility] for anemia due to GI bleed and underwent EGD which revealed a duodenal ulcer which was clipped.  He returned to renal hospital with generalized weakness and a mechanical fall.  Work-up revealed MSSA bacteremia and a right-sided large complex pleural effusion. Transthoracic echocardiogram on 11/30/2018 showed questionable mitral valve vegetation.  TEE on 12/01/2018 was negative for valvular vegetation.  He underwent CT-guided thoracostomy by IR on 12/01/18 with 1 L of frank purulent material obtained.  Pleural fluid culture reportedly grew gram-positive cocci.  Cardiothoracic surgery were consulted and recommended transfer to [facility] for CT surgery evaluation and management.  Patient was initially on IV Zosyn and transition to IV cefazolin.  Rifampin was also started due to patient's history of cervical fusion with hardware in place.  On admission to [facility] hospital, patient reports intermittent dyspnea with activity otherwise denies any chest pain, palpitations, subjective fevers, chills, diaphoresis, abdominal pain, dysuria, peripheral edema, or obvious bleeding. "  Hospital  Course:  Summary of his active problems in the hospital is as following. MSSA bacteremia, HCAP,  empyema, acute hypoxic respiratory failure - underwent chest tube on 5/27 - plan at [name] for 3 wks of Cefazolin and Rifampin  - repeat blood cultures negative  - repeat pleural fluid cultures showing MSSA. - transferred to [facility] for CT surgery to evaluate - 6/1- underwent uncomplicated VATS by Dr Cyndia Bent cultures are growing staph aureus pansensitive - ID recommend PICC and 2 wks of Cefazolin and Rifampin after VATS- Have PICC.  Last day of antibiotic 12/18/2018.  Hypotension with h/o Essential hypertension - holding antihypertensives - Cosyntropin test ordered and administered but ACTH not drawn by lab Now blood pressure better.  Monitor.  Decubitus ulcer b/l but much larger on right buttock- present on admission - followed by wound care clinic as outpt - consulted general surgery - underwent debridement after VATS in OR - added air mattress, currently getting hydrotherapy. - dakins solution added   underweight Body mass index is 17.84 kg/m. - in context of current illness - have added Ensure and requested an nutrition eval- recommendations> TID Ensure  Elevated INR -Treated with Vit K x 3 days  - INR is noted to be improved  Hyperlipidemia - cont Atorvastatin  Hypokalemia replaced    Chronic pain in neck and back - s/p cervical fusion - cont Oxy IR 15 mg Q4 hrs PRN and Gabapentin  Duodenal ulcer with GI bleed s/p clipping 4/20 - cont Protonix  Anemia- normocytic -  Hb 7.4 -8.5 range -  no prior Hb to compare with -  anemia panel consistent with AOCD  Nicotine abuse - counseled to quit- he states he would like to but his girlfriend also smokes - cont Nicotine patch  Hyperthyroid -cont Tapazol  Deconditioning/ fall on admission - PT eval ordered- recommending HHPT  Pressure Injury 12/02/18 Buttocks Right Unstageable - Full thickness tissue loss  in which the base of the ulcer is covered by slough (yellow, tan, gray, green or brown) and/or eschar (tan, brown or black) in the wound bed. (Active)  12/02/18 2208  Location: Buttocks  Location Orientation: Right  Staging: Unstageable - Full thickness tissue loss in which the base of the ulcer is covered by slough (yellow, tan, gray, green or brown) and/or eschar (tan, brown or black) in the wound bed.  Wound Description (Comments):   Present on Admission: Yes     Pain control  - [place] Controlled Substance Reporting System database was reviewed. - Last prescription for controlled substance was on 10/05/2018 for 30 days. - 4 day supply was provided. - Patient was instructed, not to drive, operate heavy machinery, perform activities at heights, swimming or participation in water activities or provide baby sitting services while on Pain, Sleep and Anxiety Medications; until his outpatient Physician has advised to do so again.  - Also recommended to not to take more than prescribed Pain, Sleep and Anxiety Medications.  Patient was seen by physical therapy, who recommended Home health, which was arranged by case manager. On the day of the discharge the patient's vitals were stable, and no other acute medical condition were reported by patient. the patient was felt safe to  be discharge at Home with home health.  Consultants:   CT surgery  General surgery  Procedures:   Chest tube  VATS  I and D of decubitus  DISCHARGE MEDICATION: Allergies as of 12/14/2018      Reactions   Aspirin Other (See Comments)   Patient states he cannot take ASA due to a stomach ulcer.      Medication List    STOP taking these medications   methimazole 5 MG tablet Commonly known as: TAPAZOLE     TAKE these medications   aspirin EC 81 MG tablet Take 1 tablet (81 mg total) by mouth daily.   atorvastatin 20 MG tablet Commonly known as: LIPITOR Take 20 mg by mouth at bedtime.   ceFAZolin   IVPB Commonly known as: ANCEF Inject 2 g into the vein every 8 (eight) hours for 11 days. Indication:  MSSA bacteremia/empyema/sacral decubitus ulcer Last Day of Therapy:  12/18/2018 Labs - Once weekly:  CBC/D and BMP, Labs - Every other week:  ESR and CRP   diazepam 2 MG tablet Commonly known as: VALIUM Take 2 mg by mouth at bedtime.   docusate sodium 100 MG capsule Commonly known as: COLACE Take 1 capsule (100 mg total) by mouth 2 (two) times daily as needed for mild constipation.   doxepin 100 MG capsule Commonly known as: SINEQUAN Take 100 mg by mouth at bedtime.   feeding supplement (ENSURE ENLIVE) Liqd Take 237 mLs by mouth 3 (three) times daily between meals.   feeding supplement (PRO-STAT SUGAR FREE 64) Liqd Take 30 mLs by mouth 2 (two) times daily.   gabapentin 600 MG tablet Commonly known as: NEURONTIN Take 600 mg by mouth 4 (four) times daily.   Narcan 4 MG/0.1ML Liqd nasal spray kit Generic drug: naloxone Place 1 spray into the nose once.   nicotine 14 mg/24hr patch Commonly known as: NICODERM CQ - dosed in mg/24 hours Place 1 patch (14 mg total) onto the skin daily.   ondansetron 4 MG tablet Commonly known as: ZOFRAN Take 1 tablet (4 mg total) by mouth every 6 (six) hours as needed for nausea.   oxyCODONE 15 MG immediate release tablet Commonly known as: ROXICODONE Take 1 tablet (15 mg total) by mouth every 6 (six) hours as needed for up to 4 days for pain. What changed:   when to take this  reasons to take this   pantoprazole 40 MG tablet Commonly known as: PROTONIX Take 40 mg by mouth daily.   ProAir HFA 108 (90 Base) MCG/ACT inhaler Generic drug: albuterol Inhale 2 puffs into the lungs 3 (three) times daily.   rifampin 300 MG capsule Commonly known as: RIFADIN Take 1 capsule (300 mg total) by mouth 2 (two) times daily for 4 days.   silver nitrate applicators 46-65 % applicator Apply 1 Stick topically as needed for bleeding.   sodium  hypochlorite 0.125 % Soln Commonly known as: DAKIN'S 1/4 STRENGTH Apply topically 2 (two) times daily.   tiZANidine 4 MG capsule Commonly known as: ZANAFLEX Take 4 mg by mouth at bedtime.            Home Infusion Instuctions  (From admission, onward)         Start     Ordered   12/14/18 0000  Home infusion instructions [facility] May follow [place] Dosing Protocol; May administer Cathflo as needed to maintain patency of vascular access device.; Flushing of vascular access device: per [facility] Protocol: 0.9% NaCl pre/post  medica...    Question Answer Comment  Instructions May follow [place] Dosing Protocol   Instructions May administer Cathflo as needed to maintain patency of vascular access device.   Instructions Flushing of vascular access device: per [facility] Protocol: 0.9% NaCl pre/post medication administration and prn patency; Heparin 100 u/ml, 74m for implanted ports and Heparin 10u/ml, 552mfor all other central venous catheters.   Instructions May follow [facility] Anaphylaxis Protocol for First Dose Administration in the home: 0.9% NaCl at 25-50 ml/hr to maintain IV access for protocol meds. Epinephrine 0.3 ml IV/IM PRN and Benadryl 25-50 IV/IM PRN s/s of anaphylaxis.   Instructions [facility] Infusion Coordinator (RN) to assist per patient IV care needs in the home PRN.      12/14/18 0950         Allergies  Allergen Reactions   Aspirin Other (See Comments)    Patient states he cannot take ASA due to a stomach ulcer.   Discharge Instructions    Diet - low sodium heart healthy   Complete by: As directed    Discharge instructions   Complete by: As directed    It is important that you read the given instructions as well as go over your medication list with RN to help you understand your care after this hospitalization.  Discharge Instructions: Please follow-up with PCP in 1-2 weeks  Please request your primary care physician to go over all Hospital Tests  and Procedure/Radiological results at the follow up. Please get all Hospital records sent to your PCP by signing hospital release before you go home.   Do not drive, operating heavy machinery, perform activities at heights, swimming or participation in water activities or provide baby sitting services; until you have been seen by Primary Care Physician or a Neurologist and advised to do so again. Do not take more than prescribed Pain, Sleep and Anxiety Medications. You were cared for by a hospitalist during your hospital stay. If you have any questions about your discharge medications or the care you received while you were in the hospital after you are discharged, you can call the unit @UNIT @ you were admitted to and ask to speak with the hospitalist on call if the hospitalist that took care of you is not available.  Once you are discharged, your primary care physician will handle any further medical issues. Please note that NO REFILLS for any discharge medications will be authorized once you are discharged, as it is imperative that you return to your primary care physician (or establish a relationship with a primary care physician if you do not have one) for your aftercare needs so that they can reassess your need for medications and monitor your lab values. You Must read complete instructions/literature along with all the possible adverse reactions/side effects for all the Medicines you take and that have been prescribed to you. Take any new Medicines after you have completely understood and accept all the possible adverse reactions/side effects. Wear Seat belts while driving. If you have smoked or chewed Tobacco in the last 2 yrs please stop smoking and/or stop any Recreational drug use.  If you drink alcohol, please stop the use and do not drive, operating heavy machinery, perform activities at heights, swimming or participation in water activities or provide baby sitting services under influence.    Home infusion instructions [facility] May follow [place] Protocol; May administer Cathflo as needed to maintain patency of vascular access device.; Flushing of vascular access  device: per [facility] Protocol: 0.9% NaCl pre/post medica...   Complete by: As directed    Instructions: May follow [place] Dosing Protocol   Instructions: May administer Cathflo as needed to maintain patency of vascular access device.   Instructions: [place]ushing of vascular access device: per [facility] Protocol: 0.9% NaCl pre/post medication administration and prn patency; Heparin 100 u/ml, 80m for implanted ports and Heparin 10u/ml, 531mfor all other central venous catheters.   Instructions: May follow [facility] Anaphylaxis Protocol for First Dose Administration in the home: 0.9% NaCl at 25-50 ml/hr to maintain IV access for protocol meds. Epinephrine 0.3 ml IV/IM PRN and Benadryl 25-50 IV/IM PRN s/s of anaphylaxis.   Instructions: [facility] Coordinator (RN) to assist per patient IV care needs in the home PRN.   Increase activity slowly   Complete by: As directed      Discharge Exam: Filed Weights   12/12/18 0601 12/13/18 0546 12/14/18 0553  Weight: 54.7 kg 54.8 kg 55.2 kg   Vitals:   12/14/18 0553 12/14/18 0748  BP: 137/63 123/68  Pulse: 93 92  Resp: 20 18  Temp: 98.4 F (36.9 C) 98 F (36.7 C)  SpO2: 99%    General: Appear in no distress, no Rash; Oral Mucosa Clear, moist. no Abnormal Mass Or lumps Cardiovascular: S1 and S2 Present, no Murmur, Respiratory: normal respiratory effort, Bilateral Air entry present and Clear to Auscultation, no Crackles, no wheezes Abdomen: Bowel Sound present, Soft and no tenderness, no hernia Extremities: no Pedal edema, no calf tenderness Neurology: alert and oriented to time, place, and person affect appropriate. normal without focal findings, mental status, speech normal, alert and oriented x3, PERLA, Motor strength 5/5 and symmetric and sensation  grossly normal to light touch   The results of significant diagnostics from this hospitalization (including imaging, microbiology, ancillary and laboratory) are listed below for reference.    Significant Diagnostic Studies: Dg Chest 2 View  Result Date: 12/03/2018 CLINICAL DATA:  Empyema EXAM: CHEST - 2 VIEW COMPARISON:  12/01/2018, CT 11/28/2018, radiograph 11/27/2018 FINDINGS: Right basilar drainage catheter. No change in residual pleural and parenchymal disease at the right base. Left lung is clear. IMPRESSION: No significant interval change in right basilar drainage catheter and residual pleural and parenchymal disease at the right base. Electronically Signed   By: KiDonavan Foil.D.   On: 12/03/2018 04:09   Dg Chest Port 1 View  Result Date: 12/14/2018 CLINICAL DATA:  Follow-up chest tube EXAM: PORTABLE CHEST 1 VIEW COMPARISON:  12/13/2010 FINDINGS: Cardiac shadows within normal limits. Right-sided PICC line is stable. Left lung remains clear. Skin folds are noted over the midportion of the left lung. Right basilar chest tube is again seen with small basilar pneumothorax. Stable right airspace opacity IMPRESSION: Small basilar pneumothorax and right basilar opacity. Electronically Signed   By: MaInez Catalina.D.   On: 12/14/2018 09:16   Dg Chest Port 1 View  Result Date: 12/13/2018 CLINICAL DATA:  Shortness of breath, pleural effusion EXAM: PORTABLE CHEST 1 VIEW COMPARISON:  12/12/2018 FINDINGS: Right PICC line and right basilar chest tube remain in place, unchanged. Improving airspace disease at the right lung base. Small residual right pleural effusion. No visible pneumothorax. Hyperinflation of the lungs. Heart is normal size. No acute bony abnormality. IMPRESSION: Improving right basilar airspace opacity, likely improving atelectasis. Small right pleural effusion. No visible pneumothorax. Hyperinflation. Electronically Signed   By: KeRolm Baptise.D.   On: 12/13/2018 08:41   Dg Chest Port 1  View  Result Date: 12/12/2018 CLINICAL DATA:  63 year old male with a history of empyema, status post VATS 12/05/2018, with shortness of breath and pneumothorax. EXAM: PORTABLE CHEST 1 VIEW COMPARISON:  12/11/2018, 12/10/2018, 12/09/2018 FINDINGS: Cardiomediastinal silhouette unchanged in size and contour. Unchanged thoracostomy tube of the right chest, terminating at the costophrenic angle. Persisting pleuroparenchymal thickening at the right lung base laterally. No visualized pneumothorax. Patchy opacity at the right lung base persist. Thickening of the minor fissure persist. Left lung well aerated. Surgical changes of the cervical region. Right upper extremity PICC is unchanged. IMPRESSION: Unchanged position of right thoracostomy tube with similar appearance of pleuroparenchymal thickening at the lung base, potentially combination of postoperative changes, and/or persisting pleural fluid. No visualized pneumothorax on the current plain film. Patchy airspace opacity at the right lung base persist. Unchanged right upper extremity PICC. Electronically Signed   By: Corrie Mckusick D.O.   On: 12/12/2018 08:00   Dg Chest Port 1 View  Result Date: 12/11/2018 CLINICAL DATA:  History of pneumothorax. EXAM: PORTABLE CHEST 1 VIEW COMPARISON:  Chest radiograph 12/10/2018 FINDINGS: Right upper extremity PICC line tip projects over the superior vena cava. Monitoring leads overlie the patient. Stable cardiac and mediastinal contours. Right chest tube remains in place. Persistent consolidative opacities right lower lung. There is a small right basilar pneumothorax which appears new from prior. IMPRESSION: Right lower chest tube remains in place. There is a new small adjacent right basilar pneumothorax. These results will be called to the ordering clinician or representative by the Radiologist Assistant, and communication documented in the PACS or zVision Dashboard. Electronically Signed   By: Lovey Newcomer M.D.   On: 12/11/2018  08:54   Dg Chest Port 1 View  Result Date: 12/10/2018 CLINICAL DATA:  Pleural effusion. EXAM: PORTABLE CHEST 1 VIEW COMPARISON:  Chest radiograph 12/09/2018 FINDINGS: Monitoring leads overlie the patient. Right upper extremity PICC line is present with tip projecting over the superior vena cava. Stable tortuosity of the thoracic aorta. Right chest tube remains in position. Similar-appearing patchy opacities right lower hemithorax. Small right pleural effusion. No definite pneumothorax. Suspected skin fold left lateral hemithorax. IMPRESSION: Right chest tube remains in position. Similar-appearing opacities right lower hemithorax with small right pleural effusion. No definite right-sided pneumothorax. Suspect skin fold projecting over the lateral left hemithorax. Recommend attention on short-term follow-up chest radiograph. Electronically Signed   By: Lovey Newcomer M.D.   On: 12/10/2018 08:34   Dg Chest Port 1 View  Result Date: 12/09/2018 CLINICAL DATA:  Pneumothorax. EXAM: PORTABLE CHEST 1 VIEW COMPARISON:  Radiograph of December 08, 2018. FINDINGS: The heart size and mediastinal contours are within normal limits. Left lung is clear. Right-sided PICC line is unchanged in position. Stable position of right basilar chest tube. Right basilar pneumothorax noted on prior exam appears to have resolved. Minimal pleural thickening or effusion may be present. Stable right basilar opacity is noted. The visualized skeletal structures are unremarkable. IMPRESSION: Stable position of right basilar chest tube. No definite pneumothorax is noted currently. Stable right basilar minimal pleural thickening or effusion. Stable right basilar opacity. Electronically Signed   By: Marijo Conception M.D.   On: 12/09/2018 09:19   Dg Chest Port 1 View  Result Date: 12/08/2018 CLINICAL DATA:  Pleural effusion, shortness of breath. EXAM: PORTABLE CHEST 1 VIEW COMPARISON:  Radiograph December 07, 2018. FINDINGS: The heart size and mediastinal  contours are within normal limits. Left lung is clear. Right-sided PICC line is unchanged in position. Stable  right basilar atelectasis is noted. Stable position of right sided pleural drainage catheter. Minimal pleural effusion may be present. Stable minimal right basilar pneumothorax is noted. The visualized skeletal structures are unremarkable. IMPRESSION: Stable position of right-sided pleural drainage catheter is noted. Stable small right basilar pneumothorax is noted with associated atelectasis of the right lower lobe. Electronically Signed   By: Marijo Conception M.D.   On: 12/08/2018 09:10   Dg Chest Port 1 View  Result Date: 12/07/2018 CLINICAL DATA:  Follow-up pleural effusion EXAM: PORTABLE CHEST 1 VIEW COMPARISON:  12/06/2010 FINDINGS: Coil chest tube is noted in the right base. Small right basilar pneumothorax is noted. Right-sided PICC line is noted in satisfactory position. The lungs again demonstrates some atelectatic changes related to the basilar pneumothorax on the right. No other focal abnormality is noted. IMPRESSION: Stable right basilar pneumothorax with associated atelectasis. Electronically Signed   By: Inez Catalina M.D.   On: 12/07/2018 08:59   Dg Chest Port 1 View  Result Date: 12/06/2018 CLINICAL DATA:  Lung infection, empyema, pain on right side EXAM: PORTABLE CHEST 1 VIEW COMPARISON:  12/05/2018 FINDINGS: Right basilar chest tube remains in place. Loculated right basilar hydropneumothorax, stable since prior study. Continued airspace opacity in the right lower lobe. Left lung clear. Heart is normal size. IMPRESSION: Right basilar chest tube remains in place with stable loculated right basilar hydropneumothorax. Right lower lobe airspace opacity unchanged. Electronically Signed   By: Rolm Baptise M.D.   On: 12/06/2018 08:12   Dg Chest Port 1 View  Result Date: 12/05/2018 CLINICAL DATA:  Empyema. EXAM: PORTABLE CHEST 1 VIEW COMPARISON:  Dec 03, 2018 FINDINGS: A right chest tube is  seen. Opacity remains in the right base, improved. There appears to be a prominent skin fold over the right apex with lung markings on both sides. No convincing evidence of pneumothorax. The left lung is clear. No other change. IMPRESSION: 1. There is a right chest tube which appears to be unchanged since the previous study. Persistent opacity remains in the right base. There is a skin fold over the right apex but no definitive evidence of pneumothorax. Recommend attention to this region on follow-up. Electronically Signed   By: Dorise Bullion III M.D   On: 12/05/2018 19:21   [place] Ekg Site Rite  Result Date: 12/06/2018 If Site Rite image not attached, placement could not be confirmed due to current cardiac rhythm.   Microbiology: No results found for this or any previous visit (from the past 240 hour(s)).   Labs: CBC: Recent Labs  Lab 12/11/18 0522  WBC 7.7  NEUTROABS 5.0  HGB 8.6*  HCT 27.9*  MCV 97.9  PLT 672   Basic Metabolic Panel: Recent Labs  Lab 12/11/18 0522  NA 138  K 3.5  CL 100  CO2 29  GLUCOSE 123*  BUN 7*  CREATININE 0.68  CALCIUM 8.4*   Liver Function Tests: Recent Labs  Lab 12/11/18 0522  AST 18  ALT 6  ALKPHOS 101  BILITOT 0.5  PROT 6.2*  ALBUMIN 1.8*   No results for input(s): LIPASE, AMYLASE in the last 168 hours. No results for input(s): AMMONIA in the last 168 hours. Cardiac Enzymes: No results for input(s): CKTOTAL, CKMB, CKMBINDEX, TROPONINI in the last 168 hours. BNP (last 3 results) No results for input(s): BNP in the last 8760 hours. CBG: Recent Labs  Lab 12/14/18 1112  GLUCAP 137*   Time spent: 35 minutes  Signed:  Berle Mull  [facility] 12/14/2018

## 2018-12-19 ENCOUNTER — Ambulatory Visit: Payer: Medicare Other | Admitting: Surgery

## 2018-12-19 ENCOUNTER — Inpatient Hospital Stay: Payer: Medicare Other | Admitting: Family

## 2018-12-21 ENCOUNTER — Ambulatory Visit: Payer: Self-pay | Admitting: Surgery

## 2018-12-26 ENCOUNTER — Other Ambulatory Visit: Payer: Self-pay | Admitting: Surgery

## 2018-12-26 ENCOUNTER — Other Ambulatory Visit: Payer: Self-pay

## 2018-12-26 DIAGNOSIS — J869 Pyothorax without fistula: Secondary | ICD-10-CM

## 2018-12-27 ENCOUNTER — Ambulatory Visit
Admission: RE | Admit: 2018-12-27 | Discharge: 2018-12-27 | Disposition: A | Discharge status: Home / Self Care | Payer: Medicare Other | Source: Ambulatory Visit | Attending: Surgery | Admitting: Surgery

## 2018-12-27 ENCOUNTER — Encounter: Payer: Self-pay | Admitting: Physician Assistant

## 2018-12-27 ENCOUNTER — Ambulatory Visit (INDEPENDENT_AMBULATORY_CARE_PROVIDER_SITE_OTHER): Payer: Self-pay | Admitting: Physician Assistant

## 2018-12-27 VITALS — BP 116/54 | HR 87 | Temp 98.1°F | Resp 20 | Ht 69.0 in | Wt 144.0 lb

## 2018-12-27 DIAGNOSIS — J869 Pyothorax without fistula: Secondary | ICD-10-CM

## 2018-12-27 MED ORDER — CEPHALEXIN 500 MG PO CAPS: 500.0000 mg | ORAL_CAPSULE | Freq: Two times a day (BID) | ORAL | 0 refills | Status: DC

## 2018-12-27 NOTE — Progress Notes (Addendum)
[address]       [place] [address]             [phone]      Randy Page is a 63 y.o. male patient who underwent a right VATS for a drainage of an empyema on 12/05/2018 with Dr. Cyndia Bent. His hospitalization was complicated by a sacral wound that required debridement by general surgery. He was discharged on 6/10 with a mini express due to a persistent air leak.  He is following up in the office today with a chest xray and to evaluate the chest tube. He has a full course of antibiotics which ended 6/14.     1. Empyema of right pleural space [facility])    Past Medical History:  Diagnosis Date  . Chronically on opiate therapy   . COPD (chronic obstructive pulmonary disease) ([place])    per patient  . 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 Empyema lung ([place]) 12/2018  . Hypercholesteremia   . Hyperlipidemia   . Hypertension   . Low back pain   . Thyrotoxicosis   . Vitamin D deficiency    No past surgical history pertinent negatives on file.    Scheduled Meds: Current Outpatient Medications on File Prior to Visit  Medication Sig Dispense Refill  . Amino Acids-Protein Hydrolys (FEEDING SUPPLEMENT, PRO-STAT SUGAR FREE 64,) LIQD Take 30 mLs by mouth 2 (two) times daily. 887 mL 0  . aspirin EC 81 MG tablet Take 1 tablet (81 mg total) by mouth daily. 90 tablet 3  . atorvastatin (LIPITOR) 20 MG tablet Take 20 mg by mouth at bedtime.  3  . diazepam (VALIUM) 2 MG tablet Take 2 mg by mouth at bedtime.  0  . docusate sodium (COLACE) 100 MG capsule Take 1 capsule (100 mg total) by mouth 2 (two) times daily as needed for mild constipation. 10 capsule 0  . doxepin (SINEQUAN) 100 MG capsule Take 100 mg by mouth at bedtime.  3  . feeding supplement, ENSURE ENLIVE, (ENSURE ENLIVE) LIQD Take 237 mLs by mouth 3 (three) times daily between meals. 237 mL 12  . gabapentin (NEURONTIN) 600 MG tablet Take 600 mg by mouth 4 (four) times daily.  3  . NARCAN 4 MG/0.1ML LIQD nasal spray  kit Place 1 spray into the nose once.   2  . nicotine (NICODERM CQ - DOSED IN MG/24 HOURS) 14 mg/24hr patch Place 1 patch (14 mg total) onto the skin daily. 28 patch 0  . ondansetron (ZOFRAN) 4 MG tablet Take 1 tablet (4 mg total) by mouth every 6 (six) hours as needed for nausea. 20 tablet 0  . pantoprazole (PROTONIX) 40 MG tablet Take 40 mg by mouth daily.  3  . PROAIR HFA 108 (90 Base) MCG/ACT inhaler Inhale 2 puffs into the lungs 3 (three) times daily.   3  . silver nitrate applicators 60-10 % applicator Apply 1 Stick topically as needed for bleeding. 100 each 0  . sodium hypochlorite (DAKIN'S 1/4 STRENGTH) 0.125 % SOLN Apply topically 2 (two) times daily. 1 Bottle 0  . tiZANidine (ZANAFLEX) 4 MG capsule Take 4 mg by mouth at bedtime.     No current facility-administered medications on file prior to visit.     Allergies  Allergen Reactions  . Aspirin Other (See Comments)    Patient states he cannot take ASA due to a stomach ulcer.   Active Problems:   * No active hospital  problems. *  Blood pressure (!) 116/54, pulse 87, temperature 98.1 F (36.7 C), temperature source Skin, resp. rate 20, height 5' 9"  (1.753 m), weight 65.3 kg, SpO2 94 %.  Subjective  Randy Page returns to the office today with a CXR and to have his chest tube and mini express evaluated. He is s/p VATS with a drainage of an empyema by Dr. Cyndia Bent. Overall, he feels well today and has had no unusual shortness of breath.   Objective    Cor: RRR Pulm: diminished in the right lower lobe. Otherwise, CTA in all other fields.  Abd: non-tender Wound: chest tube secure and dressed with ABD pad.  Ext: 1+ lower extremity edema around his cowboy boots.     CLINICAL DATA:  Empyema.  EXAM: CHEST - 2 VIEW  COMPARISON:  12/14/2018.  12/13/2018.  12/06/2018  FINDINGS: Interim right PICC line removal. Right chest tube in stable position. Recurrent right hydropneumothorax is noted. Right base infiltrate again noted.  Heart size normal. Prior cervicothoracic spine fusion.  IMPRESSION: 1. Interim right PICC line removal. Right chest tube in stable position. Recurrent right hydropneumothorax noted.  2.  Right base infiltrate again noted.  These results will be called to the ordering clinician or representative by the Radiologist Assistant, and communication documented in the PACS or zVision Dashboard.   Electronically Signed   By: Marcello Moores  Register   On: 12/27/2018 13:36   Assessment & Plan   Empyema- s/p R. VATS on 6/1. Now returning to the office with a mini express in place. Reviewed the CXR with the patient. Due to the recurrent hydropneumo he will keep his chest tube today. He does not have any shortness of breath or any other symptoms except occasional pain at the chest tube site. He states he thought it might have pulled out a time or two. Secured against the chest wall today. The sutures are in tact. Dressing is dry. He has home health nursing coming to do dressing changes and draining. He states there has only been 80cc in about three days. The drainage remains purulent and there is still an air leak. He finished his prescribed antibiotics on 6/14. He had made an effort to stop smoking and he encouraged his wife who still smokes to smoke outside away from him. He is encouraged to continue to do his breathing exercises and walk regularly. He will keep the chest tube in today and follow-up in the office with Dr. Cyndia Bent in 1-2 weeks with a CXR. If he has any questions he is encouraged to call our office.   After a discussion with pharmacy and Dr. Cyndia Bent I have ordered Keflex 552m BID until tube is out.   TElgie Collard6/23/2020

## 2018-12-27 NOTE — Patient Instructions (Signed)
Follow-up in 1-2 weeks.

## 2018-12-27 NOTE — Addendum Note (Signed)
Addended by: Elgie Collard on: 12/27/2018 04:28 PM   Modules accepted: Orders

## 2019-01-04 LAB — FUNGUS CULTURE RESULT

## 2019-01-04 LAB — FUNGUS CULTURE WITH STAIN

## 2019-01-04 LAB — FUNGAL ORGANISM REFLEX

## 2019-01-11 ENCOUNTER — Other Ambulatory Visit: Payer: Self-pay | Admitting: Surgery

## 2019-01-11 ENCOUNTER — Other Ambulatory Visit: Payer: Self-pay

## 2019-01-11 ENCOUNTER — Ambulatory Visit
Admission: RE | Admit: 2019-01-11 | Discharge: 2019-01-11 | Disposition: A | Discharge status: Home / Self Care | Payer: Medicare Other | Source: Ambulatory Visit | Attending: Surgery | Admitting: Surgery

## 2019-01-11 ENCOUNTER — Ambulatory Visit (INDEPENDENT_AMBULATORY_CARE_PROVIDER_SITE_OTHER): Payer: Self-pay | Admitting: Surgery

## 2019-01-11 VITALS — BP 166/83 | HR 91 | Temp 98.1°F | Resp 20 | Ht 69.0 in | Wt 142.0 lb

## 2019-01-11 DIAGNOSIS — J869 Pyothorax without fistula: Secondary | ICD-10-CM

## 2019-01-12 ENCOUNTER — Encounter: Payer: Self-pay | Admitting: Surgery

## 2019-01-12 NOTE — Progress Notes (Signed)
HPI: Patient returns for routine postoperative follow-up having undergone right video-assisted thoracoscopy with drainage of a large empyema secondary to right lower lobe lung abscess on 12/05/2018.  He also had a large decubitus ulcer debrided by general surgery at that time. The patient's early postoperative recovery while in the hospital was notable for an uncomplicated postoperative course.  He continued to have purulent drainage from the chest tube and some air leak which I think was related to the lung abscess which had ruptured into the pleural space causing the empyema.  His chest tube was connected to a mini express and he was discharged home. Since hospital discharge the patient reports that he has had continued drainage from the chest tube.  A home health nurse has been changing the dressing around the chest tube as well as the dressing on his sacrum.  He denies any fever or chills.  He has had no shortness of breath.  Denies cough or sputum production.  Current Outpatient Medications  Medication Sig Dispense Refill  . Amino Acids-Protein Hydrolys (FEEDING SUPPLEMENT, PRO-STAT SUGAR FREE 64,) LIQD Take 30 mLs by mouth 2 (two) times daily. 887 mL 0  . aspirin EC 81 MG tablet Take 1 tablet (81 mg total) by mouth daily. 90 tablet 3  . atorvastatin (LIPITOR) 20 MG tablet Take 20 mg by mouth at bedtime.  3  . cephALEXin (KEFLEX) 500 MG capsule Take 1 capsule (500 mg total) by mouth 2 (two) times daily. 40 capsule 0  . diazepam (VALIUM) 2 MG tablet Take 2 mg by mouth at bedtime.  0  . docusate sodium (COLACE) 100 MG capsule Take 1 capsule (100 mg total) by mouth 2 (two) times daily as needed for mild constipation. 10 capsule 0  . doxepin (SINEQUAN) 100 MG capsule Take 100 mg by mouth at bedtime.  3  . feeding supplement, ENSURE ENLIVE, (ENSURE ENLIVE) LIQD Take 237 mLs by mouth 3 (three) times daily between meals. 237 mL 12  . gabapentin (NEURONTIN) 600 MG tablet Take 600 mg by mouth 4  (four) times daily.  3  . NARCAN 4 MG/0.1ML LIQD nasal spray kit Place 1 spray into the nose once.   2  . nicotine (NICODERM CQ - DOSED IN MG/24 HOURS) 14 mg/24hr patch Place 1 patch (14 mg total) onto the skin daily. 28 patch 0  . ondansetron (ZOFRAN) 4 MG tablet Take 1 tablet (4 mg total) by mouth every 6 (six) hours as needed for nausea. 20 tablet 0  . pantoprazole (PROTONIX) 40 MG tablet Take 40 mg by mouth daily.  3  . PROAIR HFA 108 (90 Base) MCG/ACT inhaler Inhale 2 puffs into the lungs 3 (three) times daily.   3  . silver nitrate applicators 44-81 % applicator Apply 1 Stick topically as needed for bleeding. 100 each 0  . sodium hypochlorite (DAKIN'S 1/4 STRENGTH) 0.125 % SOLN Apply topically 2 (two) times daily. 1 Bottle 0  . tiZANidine (ZANAFLEX) 4 MG capsule Take 4 mg by mouth at bedtime.     No current facility-administered medications for this visit.     Physical Exam: BP (!) 166/83   Pulse 91   Temp 98.1 F (36.7 C) (Skin)   Resp 20   Ht 5' 9"  (1.753 m)   Wt 142 lb (64.4 kg)   SpO2 97% Comment: RA  BMI 20.97 kg/m  He is a chronically ill appearing thin gentleman in no distress. The right chest tube sutures have broken  and the tube is almost out.  There is still some purulent drainage from the tube. Lungs are clear.  Diagnostic Tests:  CLINICAL DATA:  Status post VATS procedure with shortness of breath  EXAM: CHEST - 2 VIEW  COMPARISON:  December 27, 2018  FINDINGS: Drainage catheter is present in the right base, unchanged. There is a small amount of residual effusion in this area. Small pneumothorax on the right, slightly smaller than on prior study. There is also patchy consolidation in the right base. Left lung clear. Heart size and pulmonary vascularity are normal. No adenopathy. There is aortic atherosclerosis. There is postoperative change in the lower cervical region.  IMPRESSION: Drainage catheter right base remains. Small right hydropneumothorax  with patchy consolidation right base, slightly less than on most recent study. Left lung clear. Heart size normal. No evident adenopathy.   Electronically Signed   By: Lowella Grip III M.D.   On: 01/11/2019 16:18   Impression:  The right chest tubes has just about fallen out and therefore I removed it.  It was only in a couple centimeters.  This was essentially an empyema tube and I think he still has an empyema cavity.  This cavity is certainly a contaminated space and is possible he may develop another closed empyema there.  My hope is that he will continue to drain from the chest tube site as it is close in.  If he develops an increasing amount of fluid in that cavity then it may require percutaneous or repeat surgical drainage.  I do not think there is any way to get down the tube in there today.  Plan:  He will keep dry dressings over the chest tube exit site and I encouraged him to spend some time laying on the right side to allow spontaneous drainage through that site.  I am going to see him back in about 2 weeks with a repeat chest x-ray.  If he develops any fever or chills or starts feeling poorly then he will return to see me sooner for a chest x-ray and possible CT scan of the chest.   Gaye Pollack, MD [facility] [phone]

## 2019-01-18 ENCOUNTER — Ambulatory Visit: Payer: Self-pay | Admitting: Surgery

## 2019-01-19 LAB — ACID FAST CULTURE WITH REFLEXED SENSITIVITIES (MYCOBACTERIA): Acid Fast Culture: NEGATIVE

## 2019-01-25 ENCOUNTER — Ambulatory Visit
Admission: RE | Admit: 2019-01-25 | Discharge: 2019-01-25 | Disposition: A | Discharge status: Home / Self Care | Payer: Medicare Other | Source: Ambulatory Visit | Attending: Surgery | Admitting: Surgery

## 2019-01-25 ENCOUNTER — Ambulatory Visit (INDEPENDENT_AMBULATORY_CARE_PROVIDER_SITE_OTHER): Payer: Self-pay | Admitting: Surgery

## 2019-01-25 ENCOUNTER — Encounter: Payer: Self-pay | Admitting: Surgery

## 2019-01-25 ENCOUNTER — Other Ambulatory Visit: Payer: Self-pay

## 2019-01-25 ENCOUNTER — Other Ambulatory Visit: Payer: Self-pay | Admitting: Surgery

## 2019-01-25 VITALS — BP 117/67 | HR 98 | Resp 18 | Ht 69.0 in | Wt 153.0 lb

## 2019-01-25 DIAGNOSIS — J869 Pyothorax without fistula: Secondary | ICD-10-CM

## 2019-01-25 DIAGNOSIS — Z09 Encounter for follow-up examination after completed treatment for conditions other than malignant neoplasm: Secondary | ICD-10-CM

## 2019-01-25 NOTE — Progress Notes (Signed)
HPI:  Patient returns for routine postoperative follow-up having undergone right video-assisted thoracoscopy with drainage of a large empyema secondary to right lower lobe lung abscess on 12/05/2018.  He also had a large decubitus ulcer debrided by general surgery at that time.  I saw him in the office on 01/11/2019 and the right chest drain had almost fell out of the chest.  It was removed.  It was still draining some purulent appearing drainage and I was concerned that he may develop a recurrent empyema.  He said that he has been feeling well.  The chest tube site drain for a few days and then closed up.  He denies any fever or chills.  He has had slight productive cough but nothing new.  The home health nurse was doing dressing changes to his sacral decubitus wound but he said that the nurse is no longer coming and his significant other is doing the dressing changes.  Current Outpatient Medications  Medication Sig Dispense Refill  . Amino Acids-Protein Hydrolys (FEEDING SUPPLEMENT, PRO-STAT SUGAR FREE 64,) LIQD Take 30 mLs by mouth 2 (two) times daily. 887 mL 0  . aspirin EC 81 MG tablet Take 1 tablet (81 mg total) by mouth daily. 90 tablet 3  . atorvastatin (LIPITOR) 20 MG tablet Take 20 mg by mouth at bedtime.  3  . cephALEXin (KEFLEX) 500 MG capsule Take 1 capsule (500 mg total) by mouth 2 (two) times daily. 40 capsule 0  . diazepam (VALIUM) 2 MG tablet Take 2 mg by mouth at bedtime.  0  . docusate sodium (COLACE) 100 MG capsule Take 1 capsule (100 mg total) by mouth 2 (two) times daily as needed for mild constipation. 10 capsule 0  . doxepin (SINEQUAN) 100 MG capsule Take 100 mg by mouth at bedtime.  3  . feeding supplement, ENSURE ENLIVE, (ENSURE ENLIVE) LIQD Take 237 mLs by mouth 3 (three) times daily between meals. 237 mL 12  . gabapentin (NEURONTIN) 600 MG tablet Take 600 mg by mouth 4 (four) times daily.  3  . NARCAN 4 MG/0.1ML LIQD nasal spray kit Place 1 spray into the nose once.    2  . nicotine (NICODERM CQ - DOSED IN MG/24 HOURS) 14 mg/24hr patch Place 1 patch (14 mg total) onto the skin daily. 28 patch 0  . ondansetron (ZOFRAN) 4 MG tablet Take 1 tablet (4 mg total) by mouth every 6 (six) hours as needed for nausea. 20 tablet 0  . pantoprazole (PROTONIX) 40 MG tablet Take 40 mg by mouth daily.  3  . PROAIR HFA 108 (90 Base) MCG/ACT inhaler Inhale 2 puffs into the lungs 3 (three) times daily.   3  . silver nitrate applicators 24-82 % applicator Apply 1 Stick topically as needed for bleeding. 100 each 0  . sodium hypochlorite (DAKIN'S 1/4 STRENGTH) 0.125 % SOLN Apply topically 2 (two) times daily. 1 Bottle 0  . tiZANidine (ZANAFLEX) 4 MG capsule Take 4 mg by mouth at bedtime.     No current facility-administered medications for this visit.      Physical Exam: BP 117/67 (BP Location: Left Arm, Patient Position: Sitting, Cuff Size: Normal)   Pulse 98   Resp 18   Ht 5' 9"  (1.753 m)   Wt 153 lb (69.4 kg)   SpO2 97% Comment: RA  BMI 22.59 kg/m  He looks well. Lung exam reveals slight decreased breath sounds in the right base. The chest tube site has healed over.  There were 2 sutures in the old chest tube site and these were removed.  Diagnostic Tests:  CLINICAL DATA:  Status post VATS on 12/05/2018, follow-up right-sided empyema  EXAM: CHEST - 2 VIEW  COMPARISON:  01/11/2019  FINDINGS: Previously seen right chest tube has been removed in the interval. No apical pneumothorax is identified. An air-fluid level is noted in the right base similar to that seen on the prior exam consistent with a small hydropneumothorax in the right posterior costophrenic angle. The overall appearance is similar to that seen on the prior exam. No new focal infiltrate or effusion is seen. No bony abnormality is noted.  IMPRESSION: Interval removal of right chest tube.  Stable apparently loculated hydropneumothorax in the right base posteriorly.   Electronically  Signed   By: Inez Catalina M.D.   On: 01/25/2019 16:25   Impression:  Overall he seems to be doing fairly well with no signs of recurrent empyema or lung abscess.  His chest x-ray looks stable with some chronic changes at the right base but may be residual fluid and inflammation.  It is not changed from his prior chest x-ray before the drain was removed.  I think at this point is best to continue observation.  I will plan to see him back in 1 month for repeat chest x-ray.  Plan:  He will return to see me in 1 month with a chest x-ray.   Gaye Pollack, MD [facility] [phone]

## 2019-02-20 ENCOUNTER — Other Ambulatory Visit: Payer: Self-pay | Admitting: Surgery

## 2019-02-20 DIAGNOSIS — J869 Pyothorax without fistula: Secondary | ICD-10-CM

## 2019-02-22 ENCOUNTER — Ambulatory Visit
Admission: RE | Admit: 2019-02-22 | Discharge: 2019-02-22 | Disposition: A | Discharge status: Home / Self Care | Payer: Medicare Other | Source: Ambulatory Visit | Attending: Surgery | Admitting: Surgery

## 2019-02-22 ENCOUNTER — Ambulatory Visit (INDEPENDENT_AMBULATORY_CARE_PROVIDER_SITE_OTHER): Payer: Self-pay | Admitting: Surgery

## 2019-02-22 ENCOUNTER — Encounter: Payer: Self-pay | Admitting: Surgery

## 2019-02-22 ENCOUNTER — Other Ambulatory Visit: Payer: Self-pay

## 2019-02-22 VITALS — BP 116/74 | HR 97 | Temp 97.7°F | Resp 16 | Ht 69.0 in | Wt 147.0 lb

## 2019-02-22 DIAGNOSIS — J869 Pyothorax without fistula: Secondary | ICD-10-CM

## 2019-02-22 DIAGNOSIS — Z09 Encounter for follow-up examination after completed treatment for conditions other than malignant neoplasm: Secondary | ICD-10-CM

## 2019-02-22 NOTE — Progress Notes (Signed)
HPI:  Patient returns for routine postoperative follow-up having undergoneright video-assisted thoracoscopy with drainage of a large empyema secondary to right lower lobe lung abscesson 12/05/2018. He also had a large decubitus ulcer debrided by general surgery at that time.  Since I last saw him on 01/25/2019 he has been feeling fairly well overall.  He denies any cough or sputum production.  He has had no fever or chills.  He is still having dressing changes to his large decubitus ulcer and reports that he is supposed to get a wound VAC placed on Friday.  Current Outpatient Medications  Medication Sig Dispense Refill  . Amino Acids-Protein Hydrolys (FEEDING SUPPLEMENT, PRO-STAT SUGAR FREE 64,) LIQD Take 30 mLs by mouth 2 (two) times daily. 887 mL 0  . aspirin EC 81 MG tablet Take 1 tablet (81 mg total) by mouth daily. 90 tablet 3  . atorvastatin (LIPITOR) 20 MG tablet Take 20 mg by mouth at bedtime.  3  . diazepam (VALIUM) 2 MG tablet Take 2 mg by mouth at bedtime.  0  . docusate sodium (COLACE) 100 MG capsule Take 1 capsule (100 mg total) by mouth 2 (two) times daily as needed for mild constipation. 10 capsule 0  . doxepin (SINEQUAN) 100 MG capsule Take 100 mg by mouth at bedtime.  3  . feeding supplement, ENSURE ENLIVE, (ENSURE ENLIVE) LIQD Take 237 mLs by mouth 3 (three) times daily between meals. 237 mL 12  . gabapentin (NEURONTIN) 600 MG tablet Take 600 mg by mouth 4 (four) times daily.  3  . NARCAN 4 MG/0.1ML LIQD nasal spray kit Place 1 spray into the nose once.   2  . nicotine (NICODERM CQ - DOSED IN MG/24 HOURS) 14 mg/24hr patch Place 1 patch (14 mg total) onto the skin daily. 28 patch 0  . ondansetron (ZOFRAN) 4 MG tablet Take 1 tablet (4 mg total) by mouth every 6 (six) hours as needed for nausea. 20 tablet 0  . pantoprazole (PROTONIX) 40 MG tablet Take 40 mg by mouth daily.  3  . PROAIR HFA 108 (90 Base) MCG/ACT inhaler Inhale 2 puffs into the lungs 3 (three) times daily.    3  . silver nitrate applicators 19-91 % applicator Apply 1 Stick topically as needed for bleeding. 100 each 0  . sodium hypochlorite (DAKIN'S 1/4 STRENGTH) 0.125 % SOLN Apply topically 2 (two) times daily. 1 Bottle 0  . tiZANidine (ZANAFLEX) 4 MG capsule Take 4 mg by mouth at bedtime.    . cephALEXin (KEFLEX) 500 MG capsule Take 1 capsule (500 mg total) by mouth 2 (two) times daily. (Patient not taking: Reported on 02/22/2019) 40 capsule 0   No current facility-administered medications for this visit.      Physical Exam: BP 116/74 (BP Location: Right Arm, Patient Position: Sitting, Cuff Size: Normal)   Pulse 97   Temp 97.7 F (36.5 C)   Resp 16   Ht 5' 9" (1.753 m)   Wt 147 lb (66.7 kg)   SpO2 97% Comment: RA  BMI 21.71 kg/m  He looks well. Lung exam is clear. The right chest tube site is completely healed.  Diagnostic Tests:  CLINICAL DATA:  Empyema right pleural space  EXAM: CHEST - 2 VIEW  COMPARISON:  01/25/2019  FINDINGS: Heart is normal size. Hyperinflation of the lungs compatible with COPD. Small right pleural effusion again noted, similar or slightly decreased since prior study. Right basilar atelectasis. Left lung clear.  IMPRESSION: COPD.  Small right pleural effusion, stable or slightly improved. Right base atelectasis.   Electronically Signed   By: Rolm Baptise M.D.   On: 02/22/2019 10:18   Impression:  His chest x-ray shows continued resolution of the right empyema with minimal residual scarring or atelectasis at the right base.  There is no active sign of infection.  The chest tube site has completely healed.  I do not think he requires any further follow-up for the right empyema.  Plan:  He will continue to follow-up with his wound care physician concerning his sacral decubitus ulcer.  I told him that he did not need to return to see me for follow-up of his right empyema which is now resolved.   Gaye Pollack, MD [facility] [phone]

## 2019-05-04 DIAGNOSIS — L89159 Pressure ulcer of sacral region, unspecified stage: Secondary | ICD-10-CM

## 2019-05-04 HISTORY — DX: Pressure ulcer of sacral region, unspecified stage: L89.159

## 2019-06-15 ENCOUNTER — Ambulatory Visit: Payer: Medicare Other | Admitting: Cardiology

## 2019-06-20 ENCOUNTER — Ambulatory Visit: Payer: Medicare Other | Admitting: Cardiology

## 2019-07-11 ENCOUNTER — Ambulatory Visit: Payer: Medicare Other | Admitting: Cardiology

## 2019-07-13 ENCOUNTER — Ambulatory Visit: Payer: Medicare Other | Admitting: Cardiology

## 2019-07-19 ENCOUNTER — Encounter: Payer: Self-pay | Admitting: Cardiology

## 2019-07-19 ENCOUNTER — Ambulatory Visit (INDEPENDENT_AMBULATORY_CARE_PROVIDER_SITE_OTHER): Payer: Medicare Other | Admitting: Cardiology

## 2019-07-19 ENCOUNTER — Other Ambulatory Visit: Payer: Self-pay

## 2019-07-19 VITALS — BP 132/64 | HR 56 | Ht 70.0 in | Wt 158.2 lb

## 2019-07-19 DIAGNOSIS — I779 Disorder of arteries and arterioles, unspecified: Secondary | ICD-10-CM

## 2019-07-19 DIAGNOSIS — F17210 Nicotine dependence, cigarettes, uncomplicated: Secondary | ICD-10-CM | POA: Diagnosis not present

## 2019-07-19 DIAGNOSIS — E782 Mixed hyperlipidemia: Secondary | ICD-10-CM | POA: Diagnosis not present

## 2019-07-19 DIAGNOSIS — I10 Essential (primary) hypertension: Secondary | ICD-10-CM

## 2019-07-19 HISTORY — DX: Disorder of arteries and arterioles, unspecified: I77.9

## 2019-07-19 NOTE — Patient Instructions (Signed)
Medication Instructions:  Your physician recommends that you continue on your current medications as directed. Please refer to the Current Medication list given to you today.  *If you need a refill on your cardiac medications before your next appointment, please call your pharmacy*  Lab Work: NONE If you have labs (blood work) drawn today and your tests are completely normal, you will receive your results only by: Marland Kitchen MyChart Message (if you have MyChart) OR . A paper copy in the mail If you have any lab test that is abnormal or we need to change your treatment, we will call you to review the results.  Testing/Procedures: Your physician has requested that you have an echocardiogram. Echocardiography is a painless test that uses sound waves to create images of your heart. It provides your doctor with information about the size and shape of your heart and how well your heart's chambers and valves are working. This procedure takes approximately one hour. There are no restrictions for this procedure.  Your physician has requested that you have a carotid duplex. This test is an ultrasound of the carotid arteries in your neck. It looks at blood flow through these arteries that supply the brain with blood. Allow one hour for this exam. There are no restrictions or special instructions.    Follow-Up: At [facility], you and your health needs are our priority.  As part of our continuing mission to provide you with exceptional heart care, we have created designated Provider Care Teams.  These Care Teams include your primary Cardiologist (physician) and Advanced Practice Providers (APPs -  Physician Assistants and Nurse Practitioners) who all work together to provide you with the care you need, when you need it.  Your next appointment:   6 month(s)  The format for your next appointment:   In Person  Provider:   Jyl Heinz, MD  Other Instructions  Echocardiogram An echocardiogram is a  procedure that uses painless sound waves (ultrasound) to produce an image of the heart. Images from an echocardiogram can provide important information about:  Signs of coronary artery disease (CAD).  Aneurysm detection. An aneurysm is a weak or damaged part of an artery wall that bulges out from the normal force of blood pumping through the body.  Heart size and shape. Changes in the size or shape of the heart can be associated with certain conditions, including heart failure, aneurysm, and CAD.  Heart muscle function.  Heart valve function.  Signs of a past heart attack.  Fluid buildup around the heart.  Thickening of the heart muscle.  A tumor or infectious growth around the heart valves. Tell a health care provider about:  Any allergies you have.  All medicines you are taking, including vitamins, herbs, eye drops, creams, and over-the-counter medicines.  Any blood disorders you have.  Any surgeries you have had.  Any medical conditions you have.  Whether you are pregnant or may be pregnant. What are the risks? Generally, this is a safe procedure. However, problems may occur, including:  Allergic reaction to dye (contrast) that may be used during the procedure. What happens before the procedure? No specific preparation is needed. You may eat and drink normally. What happens during the procedure?   An IV tube may be inserted into one of your veins.  You may receive contrast through this tube. A contrast is an injection that improves the quality of the pictures from your heart.  A gel will be applied to your chest.  A  wand-like tool (transducer) will be moved over your chest. The gel will help to transmit the sound waves from the transducer.  The sound waves will harmlessly bounce off of your heart to allow the heart images to be captured in real-time motion. The images will be recorded on a computer. The procedure may vary among health care providers and  hospitals. What happens after the procedure?  You may return to your normal, everyday life, including diet, activities, and medicines, unless your health care provider tells you not to do that. Summary  An echocardiogram is a procedure that uses painless sound waves (ultrasound) to produce an image of the heart.  Images from an echocardiogram can provide important information about the size and shape of your heart, heart muscle function, heart valve function, and fluid buildup around your heart.  You do not need to do anything to prepare before this procedure. You may eat and drink normally.  After the echocardiogram is completed, you may return to your normal, everyday life, unless your health care provider tells you not to do that. This information is not intended to replace advice given to you by your health care provider. Make sure you discuss any questions you have with your health care provider. Document Revised: 10/13/2018 Document Reviewed: 07/25/2016 Elsevier Patient Education  2020 [employer].

## 2019-07-19 NOTE — Progress Notes (Signed)
Cardiology Office Note:    Date:  07/19/2019   ID:  [name], DOB Jul 17, 1955, MRN [id number]  PCP:  [name], MD  Cardiologist:  Jenean Lindau, MD   Referring MD: [name], MD    ASSESSMENT:    1. Essential hypertension   2. Mixed hyperlipidemia   3. Mixed dyslipidemia   4. Cigarette smoker   5. Bilateral carotid artery disease, unspecified type ([place])    PLAN:    In order of problems listed above:  1. Atherosclerotic vascular disease: Secondary prevention stressed to the patient.  Importance of compliance with diet and medication stressed and he vocalized understanding.  He has bilateral carotid disease and I reviewed his previous records and we will set him up for bilateral carotid evaluation. 2. Essential hypertension: Blood pressure is stable. 3. Mixed dyslipidemia: Diet was discussed.  Lipids followed by primary care physician. 4. Cigarette smoker: I spent 5 minutes with the patient discussing solely about smoking. Smoking cessation was counseled. I suggested to the patient also different medications and pharmacological interventions. Patient is keen to try stopping on its own at this time. He will get back to me if he needs any further assistance in this matter. 5. Patient will be seen in follow-up appointment in 6 months or earlier if the patient has any concerns    Medication Adjustments/Labs and Tests Ordered: Current medicines are reviewed at length with the patient today.  Concerns regarding medicines are outlined above.  No orders of the defined types were placed in this encounter.  No orders of the defined types were placed in this encounter.    No chief complaint on file.    History of Present Illness:    Randy Page is a 64 y.o. male.  Patient is here for follow-up.  He has atherosclerotic vascular disease and carotid artery disease.  History of dyslipidemia.  He continues to smoke unfortunately.  He has not been seen in for  close to 2 years.  He denies any chest pain orthopnea or PND.  He has orthopedic issues and limited ambulation.  At the time of my evaluation, the patient is alert awake oriented and in no distress.  Past Medical History:  Diagnosis Date  . Chronically on opiate therapy   . COPD (chronic obstructive pulmonary disease) ([place])    per patient  . 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 Empyema lung ([place]) 12/2018  . Hypercholesteremia   . Hyperlipidemia   . Hypertension   . Low back pain   . Thyrotoxicosis   . Vitamin D deficiency     Past Surgical History:  Procedure Laterality Date  . HERNIA REPAIR    . KNEE ARTHROSCOPY    . NECK SURGERY    . VIDEO ASSISTED THORACOSCOPY (VATS)/EMPYEMA Right 12/05/2018   Procedure: RIGHT VIDEO ASSISTED THORACOSCOPY DRAIN EMPYEMA/LUNG ABSCESS;  Surgeon: Gaye Pollack, MD;  Location: [place];  Service: Thoracic;  Laterality: Right;  . WOUND DEBRIDEMENT Right 12/05/2018   Procedure: Excisional Debridement Right Sacral Wound;  Surgeon: Greer Pickerel, MD;  Location: [place];  Service: General;  Laterality: Right;    Current Medications: Current Meds  Medication Sig  . Amino Acids-Protein Hydrolys (FEEDING SUPPLEMENT, PRO-STAT SUGAR FREE 64,) LIQD Take 30 mLs by mouth 2 (two) times daily.  Marland Kitchen aspirin EC 81 MG tablet Take 1 tablet (81 mg total) by mouth daily.  Marland Kitchen atorvastatin (LIPITOR) 20 MG tablet Take 20 mg by mouth at bedtime.  Marland Kitchen  carvedilol (COREG) 25 MG tablet Take 25 mg by mouth 2 (two) times daily.  . cephALEXin (KEFLEX) 500 MG capsule Take 1 capsule (500 mg total) by mouth 2 (two) times daily.  . diazepam (VALIUM) 2 MG tablet Take 2 mg by mouth at bedtime.  . docusate sodium (COLACE) 100 MG capsule Take 1 capsule (100 mg total) by mouth 2 (two) times daily as needed for mild constipation.  Marland Kitchen doxepin (SINEQUAN) 100 MG capsule Take 100 mg by mouth at bedtime.  . feeding supplement, ENSURE ENLIVE, (ENSURE ENLIVE) LIQD Take 237 mLs by mouth 3 (three) times daily  between meals.  . gabapentin (NEURONTIN) 600 MG tablet Take 600 mg by mouth 4 (four) times daily.  Marland Kitchen NARCAN 4 MG/0.1ML LIQD nasal spray kit Place 1 spray into the nose once.   . nicotine (NICODERM CQ - DOSED IN MG/24 HOURS) 14 mg/24hr patch Place 1 patch (14 mg total) onto the skin daily.  . ondansetron (ZOFRAN) 4 MG tablet Take 1 tablet (4 mg total) by mouth every 6 (six) hours as needed for nausea.  Marland Kitchen oxyCODONE (ROXICODONE) 15 MG immediate release tablet Take 15 mg by mouth 4 (four) times daily.  . pantoprazole (PROTONIX) 40 MG tablet Take 40 mg by mouth daily.  Marland Kitchen PROAIR HFA 108 (90 Base) MCG/ACT inhaler Inhale 2 puffs into the lungs 3 (three) times daily.   . silver nitrate applicators 24-40 % applicator Apply 1 Stick topically as needed for bleeding.  . sodium hypochlorite (DAKIN'S 1/4 STRENGTH) 0.125 % SOLN Apply topically 2 (two) times daily.  Marland Kitchen tiZANidine (ZANAFLEX) 4 MG capsule Take 4 mg by mouth at bedtime.     Allergies:   Aspirin   Social History   Socioeconomic History  . Marital status: Single    Spouse name: Not on file  . Number of children: Not on file  . Years of education: Not on file  . Highest education level: Not on file  Occupational History  . Not on file  Tobacco Use  . Smoking status: Current Every Day Smoker    Packs/day: 0.50    Years: 0.00    Pack years: 0.00    Types: Cigarettes  . Smokeless tobacco: Never Used  . Tobacco comment: 18 year pack history, smokes 2 a day  Substance and Sexual Activity  . Alcohol use: Not Currently  . Drug use: Never  . Sexual activity: Not on file  Other Topics Concern  . Not on file  Social History Narrative  . Not on file   Social Determinants of Health   Financial Resource Strain:   . Difficulty of Paying Living Expenses: Not on file  Food Insecurity:   . Worried About Charity fundraiser in the Last Year: Not on file  . Ran Out of Food in the Last Year: Not on file  Transportation Needs:   . Lack of  Transportation (Medical): Not on file  . Lack of Transportation (Non-Medical): Not on file  Physical Activity:   . Days of Exercise per Week: Not on file  . Minutes of Exercise per Session: Not on file  Stress:   . Feeling of Stress : Not on file  Social Connections:   . Frequency of Communication with Friends and Family: Not on file  . Frequency of Social Gatherings with Friends and Family: Not on file  . Attends Religious Services: Not on file  . Active Member of Clubs or Organizations: Not on file  . Attends Club  or Organization Meetings: Not on file  . Marital Status: Not on file     Family History: The patient's family history is not on file.  ROS:   Please see the history of present illness.    All other systems reviewed and are negative.  EKGs/Labs/Other Studies Reviewed:    The following studies were reviewed today: EKG reveals sinus rhythm and nonspecific ST-T changes   Recent Labs: 12/11/2018: ALT 6; BUN 7; Creatinine, Ser 0.68; Hemoglobin 8.6; Platelets 328; Potassium 3.5; Sodium 138  Recent Lipid Panel No results found for: CHOL, TRIG, HDL, CHOLHDL, VLDL, LDLCALC, LDLDIRECT  Physical Exam:    VS:  BP 132/64 (BP Location: Left Arm, Patient Position: Sitting, Cuff Size: Normal)   Pulse (!) 56   Ht 5' 10"  (1.778 m)   Wt 158 lb 3.2 oz (71.8 kg)   SpO2 97%   BMI 22.70 kg/m     Wt Readings from Last 3 Encounters:  07/19/19 158 lb 3.2 oz (71.8 kg)  02/22/19 147 lb (66.7 kg)  01/25/19 153 lb (69.4 kg)     GEN: Patient is in no acute distress HEENT: Normal NECK: No JVD; No carotid bruits LYMPHATICS: No lymphadenopathy CARDIAC: Hear sounds regular, 2/6 systolic murmur at the apex. RESPIRATORY:  Clear to auscultation without rales, wheezing or rhonchi  ABDOMEN: Soft, non-tender, non-distended MUSCULOSKELETAL:  No edema; No deformity  SKIN: Warm and dry NEUROLOGIC:  Alert and oriented x 3 PSYCHIATRIC:  Normal affect   Signed, Jenean Lindau, MD   07/19/2019 10:31 AM    [place]

## 2019-09-08 ENCOUNTER — Ambulatory Visit (INDEPENDENT_AMBULATORY_CARE_PROVIDER_SITE_OTHER): Payer: Medicare Other

## 2019-09-08 ENCOUNTER — Other Ambulatory Visit: Payer: Self-pay

## 2019-09-08 DIAGNOSIS — I779 Disorder of arteries and arterioles, unspecified: Secondary | ICD-10-CM

## 2019-09-08 DIAGNOSIS — I6523 Occlusion and stenosis of bilateral carotid arteries: Secondary | ICD-10-CM | POA: Diagnosis not present

## 2019-09-08 DIAGNOSIS — I10 Essential (primary) hypertension: Secondary | ICD-10-CM

## 2019-09-08 NOTE — Progress Notes (Signed)
Complete echocardiogram has been performed.  Jimmy Hilmer Aliberti RDCS, RVT 

## 2019-09-08 NOTE — Progress Notes (Unsigned)
Carotid duplex exam has been performed.  [name] RDCS, RVT 

## 2019-12-20 DIAGNOSIS — Z872 Personal history of diseases of the skin and subcutaneous tissue: Secondary | ICD-10-CM

## 2019-12-20 HISTORY — DX: Personal history of diseases of the skin and subcutaneous tissue: Z87.2

## 2020-02-15 DIAGNOSIS — B353 Tinea pedis: Secondary | ICD-10-CM

## 2020-02-15 DIAGNOSIS — B351 Tinea unguium: Secondary | ICD-10-CM

## 2020-02-15 HISTORY — DX: Tinea unguium: B35.1

## 2020-02-15 HISTORY — DX: Tinea pedis: B35.3

## 2020-02-29 ENCOUNTER — Encounter: Payer: Self-pay | Admitting: Cardiology

## 2020-02-29 ENCOUNTER — Ambulatory Visit (INDEPENDENT_AMBULATORY_CARE_PROVIDER_SITE_OTHER): Payer: Medicare Other | Admitting: Cardiology

## 2020-02-29 ENCOUNTER — Other Ambulatory Visit: Payer: Self-pay

## 2020-02-29 VITALS — BP 124/70 | HR 58 | Ht 69.0 in | Wt 165.6 lb

## 2020-02-29 DIAGNOSIS — I7090 Unspecified atherosclerosis: Secondary | ICD-10-CM | POA: Diagnosis not present

## 2020-02-29 DIAGNOSIS — E782 Mixed hyperlipidemia: Secondary | ICD-10-CM

## 2020-02-29 DIAGNOSIS — F17210 Nicotine dependence, cigarettes, uncomplicated: Secondary | ICD-10-CM | POA: Diagnosis not present

## 2020-02-29 DIAGNOSIS — I10 Essential (primary) hypertension: Secondary | ICD-10-CM | POA: Diagnosis not present

## 2020-02-29 NOTE — Addendum Note (Signed)
Addended by: Ambrosia Wisnewski, Elmarie Shiley L on: 02/29/2020 11:29 AM   Modules accepted: Orders

## 2020-02-29 NOTE — Progress Notes (Signed)
Cardiology Office Note:    Date:  02/29/2020   ID:  SILVIO SAUSEDO, DOB 03-11-1956, MRN [id number]  PCP:  [name], MD  Cardiologist:  Jenean Lindau, MD   Referring MD: [name], MD    ASSESSMENT:    1. Atherosclerotic vascular disease   2. Essential hypertension   3. Cigarette smoker   4. Mixed dyslipidemia    PLAN:    In order of problems listed above:  1. Atherosclerotic vascular disease: Secondary prevention stressed with the patient.  Importance of compliance with diet medication stressed and he vocalized understanding. 2. Essential hypertension: Blood pressure is stable and diet was emphasized 3. Mixed dyslipidemia and diabetes mellitus.  I discussed diet with the patient extensively.  He is doing well with this.  He is blood work is followed by his primary care physician.  I do not have access at this time to the lab work.  Patient also is on aspirin 81 mg coated on a daily basis. 4. Cigarette smoker: I spent 5 minutes with the patient discussing solely about smoking. Smoking cessation was counseled. I suggested to the patient also different medications and pharmacological interventions. Patient is keen to try stopping on its own at this time. He will get back to me if he needs any further assistance in this matter. 5. Patient will be seen in follow-up appointment in 6 months or earlier if the patient has any concerns    Medication Adjustments/Labs and Tests Ordered: Current medicines are reviewed at length with the patient today.  Concerns regarding medicines are outlined above.  No orders of the defined types were placed in this encounter.  No orders of the defined types were placed in this encounter.    No chief complaint on file.    History of Present Illness:    Randy Page is a 64 y.o. male.  Patient has past medical history of atherosclerotic vascular disease, essential hypertension dyslipidemia and diabetes mellitus.  He denies any  problems at this time and takes care of activities of daily living.  No chest pain orthopnea or PND.  At the time of my evaluation, the patient is alert awake oriented and in no distress.  Past Medical History:  Diagnosis Date  . Chronically on opiate therapy   . COPD (chronic obstructive pulmonary disease) ([place])    per patient  . 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 Empyema lung ([place]) 12/2018  . Hypercholesteremia   . Hyperlipidemia   . Hypertension   . Low back pain   . Thyrotoxicosis   . Vitamin D deficiency     Past Surgical History:  Procedure Laterality Date  . HERNIA REPAIR    . KNEE ARTHROSCOPY    . NECK SURGERY    . VIDEO ASSISTED THORACOSCOPY (VATS)/EMPYEMA Right 12/05/2018   Procedure: RIGHT VIDEO ASSISTED THORACOSCOPY DRAIN EMPYEMA/LUNG ABSCESS;  Surgeon: Gaye Pollack, MD;  Location: [place];  Service: Thoracic;  Laterality: Right;  . WOUND DEBRIDEMENT Right 12/05/2018   Procedure: Excisional Debridement Right Sacral Wound;  Surgeon: Greer Pickerel, MD;  Location: [place];  Service: General;  Laterality: Right;    Current Medications: Current Meds  Medication Sig  . Amino Acids-Protein Hydrolys (FEEDING SUPPLEMENT, PRO-STAT SUGAR FREE 64,) LIQD Take 30 mLs by mouth 2 (two) times daily.  Marland Kitchen aspirin EC 81 MG tablet Take 1 tablet (81 mg total) by mouth daily.  Marland Kitchen atorvastatin (LIPITOR) 20 MG tablet Take 20 mg by mouth  at bedtime.  . carvedilol (COREG) 25 MG tablet Take 25 mg by mouth 2 (two) times daily.  . diazepam (VALIUM) 2 MG tablet Take 2 mg by mouth at bedtime.  . docusate sodium (COLACE) 100 MG capsule Take 1 capsule (100 mg total) by mouth 2 (two) times daily as needed for mild constipation.  Marland Kitchen doxepin (SINEQUAN) 100 MG capsule Take 100 mg by mouth at bedtime.  . feeding supplement, ENSURE ENLIVE, (ENSURE ENLIVE) LIQD Take 237 mLs by mouth 3 (three) times daily between meals.  . gabapentin (NEURONTIN) 600 MG tablet Take 600 mg by mouth 4 (four) times daily.  Marland Kitchen NARCAN  4 MG/0.1ML LIQD nasal spray kit Place 1 spray into the nose once.   . ondansetron (ZOFRAN) 4 MG tablet Take 1 tablet (4 mg total) by mouth every 6 (six) hours as needed for nausea.  Marland Kitchen oxyCODONE (ROXICODONE) 15 MG immediate release tablet Take 15 mg by mouth 4 (four) times daily.  . pantoprazole (PROTONIX) 40 MG tablet Take 40 mg by mouth daily.  Marland Kitchen PROAIR HFA 108 (90 Base) MCG/ACT inhaler Inhale 2 puffs into the lungs 3 (three) times daily.   . sodium hypochlorite (DAKIN'S 1/4 STRENGTH) 0.125 % SOLN Apply topically 2 (two) times daily.  Marland Kitchen terbinafine (LAMISIL) 250 MG tablet Take 250 mg by mouth daily.  Marland Kitchen tiZANidine (ZANAFLEX) 4 MG tablet Take 4 mg by mouth at bedtime.     Allergies:   Aspirin   Social History   Socioeconomic History  . Marital status: Single    Spouse name: Not on file  . Number of children: Not on file  . Years of education: Not on file  . Highest education level: Not on file  Occupational History  . Not on file  Tobacco Use  . Smoking status: Current Every Day Smoker    Packs/day: 0.50    Years: 0.00    Pack years: 0.00    Types: Cigarettes  . Smokeless tobacco: Never Used  . Tobacco comment: 18 year pack history, smokes 2 a day  Vaping Use  . Vaping Use: Never used  Substance and Sexual Activity  . Alcohol use: Not Currently  . Drug use: Never  . Sexual activity: Not on file  Other Topics Concern  . Not on file  Social History Narrative  . Not on file   Social Determinants of Health   Financial Resource Strain:   . Difficulty of Paying Living Expenses: Not on file  Food Insecurity:   . Worried About Charity fundraiser in the Last Year: Not on file  . Ran Out of Food in the Last Year: Not on file  Transportation Needs:   . Lack of Transportation (Medical): Not on file  . Lack of Transportation (Non-Medical): Not on file  Physical Activity:   . Days of Exercise per Week: Not on file  . Minutes of Exercise per Session: Not on file  Stress:   .  Feeling of Stress : Not on file  Social Connections:   . Frequency of Communication with Friends and Family: Not on file  . Frequency of Social Gatherings with Friends and Family: Not on file  . Attends Religious Services: Not on file  . Active Member of Clubs or Organizations: Not on file  . Attends Archivist Meetings: Not on file  . Marital Status: Not on file     Family History: The patient's family history is not on file.  ROS:   Please  see the history of present illness.    All other systems reviewed and are negative.  EKGs/Labs/Other Studies Reviewed:    The following studies were reviewed today: Summary:  Right Carotid: Velocities in the right ICA are consistent with a 1-39%  stenosis.   Left Carotid: Velocities in the left ICA are consistent with a 1-39%  stenosis.   Vertebrals: Bilateral vertebral arteries demonstrate antegrade flow.  Subclavians: Normal flow hemodynamics were seen in bilateral subclavian        arteries.   *See table(s) above for measurements and observations.           Recent Labs: No results found for requested labs within last 8760 hours.  Recent Lipid Panel No results found for: CHOL, TRIG, HDL, CHOLHDL, VLDL, LDLCALC, LDLDIRECT  Physical Exam:    VS:  BP 124/70   Pulse (!) 58   Ht 5' 9"  (1.753 m)   Wt 165 lb 9.6 oz (75.1 kg)   SpO2 96%   BMI 24.45 kg/m     Wt Readings from Last 3 Encounters:  02/29/20 165 lb 9.6 oz (75.1 kg)  07/19/19 158 lb 3.2 oz (71.8 kg)  02/22/19 147 lb (66.7 kg)     GEN: Patient is in no acute distress HEENT: Normal NECK: No JVD; No carotid bruits LYMPHATICS: No lymphadenopathy CARDIAC: Hear sounds regular, 2/6 systolic murmur at the apex. RESPIRATORY:  Clear to auscultation without rales, wheezing or rhonchi  ABDOMEN: Soft, non-tender, non-distended MUSCULOSKELETAL:  No edema; No deformity  SKIN: Warm and dry NEUROLOGIC:  Alert and oriented x 3 PSYCHIATRIC:  Normal affect     Signed, Jenean Lindau, MD  02/29/2020 8:39 AM    [facility]

## 2020-02-29 NOTE — Patient Instructions (Signed)

## 2020-09-06 DIAGNOSIS — E559 Vitamin D deficiency, unspecified: Secondary | ICD-10-CM | POA: Insufficient documentation

## 2020-09-06 DIAGNOSIS — E7800 Pure hypercholesterolemia, unspecified: Secondary | ICD-10-CM | POA: Insufficient documentation

## 2020-09-06 DIAGNOSIS — E0590 Thyrotoxicosis, unspecified without thyrotoxic crisis or storm: Secondary | ICD-10-CM | POA: Insufficient documentation

## 2020-09-06 DIAGNOSIS — E785 Hyperlipidemia, unspecified: Secondary | ICD-10-CM | POA: Insufficient documentation

## 2020-09-06 DIAGNOSIS — J449 Chronic obstructive pulmonary disease, unspecified: Secondary | ICD-10-CM | POA: Insufficient documentation

## 2020-09-06 DIAGNOSIS — I10 Essential (primary) hypertension: Secondary | ICD-10-CM | POA: Insufficient documentation

## 2020-09-06 DIAGNOSIS — M545 Low back pain, unspecified: Secondary | ICD-10-CM | POA: Insufficient documentation

## 2020-09-06 DIAGNOSIS — Z79891 Long term (current) use of opiate analgesic: Secondary | ICD-10-CM | POA: Insufficient documentation

## 2020-09-09 ENCOUNTER — Ambulatory Visit: Payer: Medicare Other | Admitting: Cardiology

## 2020-09-10 ENCOUNTER — Encounter: Payer: Self-pay | Admitting: Cardiology

## 2020-09-10 ENCOUNTER — Ambulatory Visit (INDEPENDENT_AMBULATORY_CARE_PROVIDER_SITE_OTHER): Payer: Medicare Other | Admitting: Cardiology

## 2020-09-10 ENCOUNTER — Other Ambulatory Visit: Payer: Self-pay

## 2020-09-10 VITALS — BP 124/58 | HR 70 | Ht 69.0 in | Wt 144.2 lb

## 2020-09-10 DIAGNOSIS — I7090 Unspecified atherosclerosis: Secondary | ICD-10-CM

## 2020-09-10 DIAGNOSIS — E782 Mixed hyperlipidemia: Secondary | ICD-10-CM

## 2020-09-10 DIAGNOSIS — I10 Essential (primary) hypertension: Secondary | ICD-10-CM | POA: Diagnosis not present

## 2020-09-10 DIAGNOSIS — F17210 Nicotine dependence, cigarettes, uncomplicated: Secondary | ICD-10-CM | POA: Diagnosis not present

## 2020-09-10 NOTE — Progress Notes (Signed)
Cardiology Office Note:    Date:  09/10/2020   ID:  Randy Page, DOB [birth year], MRN [id number]  PCP:  [name], MD  Cardiologist:  Jenean Lindau, MD   Referring MD: [name], MD    ASSESSMENT:    1. Mixed dyslipidemia   2. Cigarette smoker   3. Atherosclerotic vascular disease   4. Essential hypertension   5. Mixed hyperlipidemia    PLAN:    In order of problems listed above:  1. Atherosclerotic vascular disease: Secondary prevention stressed to the patient.  Importance of compliance with diet medication stressed any vocalized understanding.  He was told to ambulate to the best of his ability and agrees. 2. Essential hypertension: Blood pressure stable and diet was emphasized.  Salt intake issues were discussed. 3. Mixed dyslipidemia: Lipids were drawn by primary care physician.  We will try to get a copy of those records. 4. Cigarette smoker: I spent 5 minutes with the patient discussing solely about smoking. Smoking cessation was counseled. I suggested to the patient also different medications and pharmacological interventions. Patient is keen to try stopping on its own at this time. He will get back to me if he needs any further assistance in this matter. 5. Patient will be seen in follow-up appointment in 6 months or earlier if the patient has any concerns    Medication Adjustments/Labs and Tests Ordered: Current medicines are reviewed at length with the patient today.  Concerns regarding medicines are outlined above.  No orders of the defined types were placed in this encounter.  No orders of the defined types were placed in this encounter.    No chief complaint on file.    History of Present Illness:    Randy Page is a 65 y.o. male.  Patient has past medical history of atherosclerotic vascular disease, essential hypertension mixed dyslipidemia and cigarette smoking.  Patient mentions to me that because of  pain he leads a sedentary  lifestyle.  He denies any chest pain orthopnea or PND.  At the time of my evaluation, the patient is alert awake oriented and in no distress.  Unfortunately continues to smoke.  He mentions to me that his lab work is done by his primary care provider.  Past Medical History:  Diagnosis Date  . Atherosclerotic vascular disease 09/15/2017  . Carotid artery disease ([place]) 07/19/2019  . Carotid artery stenosis 09/15/2017  . Chronic pain 12/02/2018  . Chronically on opiate therapy   . Cigarette smoker 09/15/2017  . COPD (chronic obstructive pulmonary disease) ([place])    per patient  . 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 Decubitus ulcer 12/02/2018  . Decubitus ulcer of coccyx, unspecified pressure ulcer stage 05/04/2019  . Duodenal ulcer 12/02/2018  . Elevated INR 12/03/2018  . Empyema ([place]) 12/05/2018  . Empyema lung ([place]) 12/2018  . Empyema of right pleural space ([place]) 12/02/2018  . Essential hypertension 09/15/2017  . History of decubitus ulcer 12/20/2019  . Hypercholesteremia   . Hyperlipidemia   . Hypertension   . Low back pain   . Mixed dyslipidemia 09/15/2017  . MSSA bacteremia 12/02/2018  . Onychomycosis due to dermatophyte 02/15/2020  . SOB (shortness of breath) on exertion 09/15/2017  . Thyrotoxicosis   . Tinea pedis of both feet 02/15/2020  . Vitamin D deficiency     Past Surgical History:  Procedure Laterality Date  . HERNIA REPAIR    . KNEE ARTHROSCOPY    . NECK SURGERY    .  VIDEO ASSISTED THORACOSCOPY (VATS)/EMPYEMA Right 12/05/2018   Procedure: RIGHT VIDEO ASSISTED THORACOSCOPY DRAIN EMPYEMA/LUNG ABSCESS;  Surgeon: Gaye Pollack, MD;  Location: [facility];  Service: Thoracic;  Laterality: Right;  . WOUND DEBRIDEMENT Right 12/05/2018   Procedure: Excisional Debridement Right Sacral Wound;  Surgeon: Greer Pickerel, MD;  Location: [facility];  Service: General;  Laterality: Right;    Current Medications: Current Meds  Medication Sig  . aspirin EC 81 MG tablet Take 1 tablet (81 mg total) by  mouth daily.  Marland Kitchen atorvastatin (LIPITOR) 20 MG tablet Take 20 mg by mouth at bedtime.  . carvedilol (COREG) 25 MG tablet Take 25 mg by mouth 2 (two) times daily.  . diazepam (VALIUM) 2 MG tablet Take 2 mg by mouth at bedtime.  . docusate sodium (COLACE) 100 MG capsule Take 1 capsule (100 mg total) by mouth 2 (two) times daily as needed for mild constipation.  Marland Kitchen doxepin (SINEQUAN) 100 MG capsule Take 100 mg by mouth at bedtime.  . gabapentin (NEURONTIN) 600 MG tablet Take 600 mg by mouth 4 (four) times daily.  Marland Kitchen NARCAN 4 MG/0.1ML LIQD nasal spray kit Place 1 spray into the nose once.  . ondansetron (ZOFRAN) 4 MG tablet Take 1 tablet (4 mg total) by mouth every 6 (six) hours as needed for nausea.  Marland Kitchen oxyCODONE (ROXICODONE) 15 MG immediate release tablet Take 15 mg by mouth 4 (four) times daily.  . pantoprazole (PROTONIX) 40 MG tablet Take 40 mg by mouth daily.  Marland Kitchen PROAIR HFA 108 (90 Base) MCG/ACT inhaler Inhale 2 puffs into the lungs 3 (three) times daily.   . tamsulosin (FLOMAX) 0.4 MG CAPS capsule Take 0.4 mg by mouth daily.  Marland Kitchen terbinafine (LAMISIL) 250 MG tablet Take 250 mg by mouth daily.  Marland Kitchen tiZANidine (ZANAFLEX) 4 MG tablet Take 4 mg by mouth at bedtime.     Allergies:   Aspirin   Social History   Socioeconomic History  . Marital status: Single    Spouse name: Not on file  . Number of children: Not on file  . Years of education: Not on file  . Highest education level: Not on file  Occupational History  . Not on file  Tobacco Use  . Smoking status: Current Every Day Smoker    Packs/day: 0.50    Years: 0.00    Pack years: 0.00    Types: Cigarettes  . Smokeless tobacco: Never Used  . Tobacco comment: 18 year pack history, smokes 2 a day  Vaping Use  . Vaping Use: Never used  Substance and Sexual Activity  . Alcohol use: Not Currently  . Drug use: Never  . Sexual activity: Not on file  Other Topics Concern  . Not on file  Social History Narrative  . Not on file   Social  Determinants of Health   Financial Resource Strain: Not on file  Food Insecurity: Not on file  Transportation Needs: Not on file  Physical Activity: Not on file  Stress: Not on file  Social Connections: Not on file     Family History: The patient's Family history is unknown by patient.  ROS:   Please see the history of present illness.    All other systems reviewed and are negative.  EKGs/Labs/Other Studies Reviewed:    The following studies were reviewed today: FINDINGS  Left Ventricle: Left ventricular ejection fraction, by estimation, is 60  to 65%. The left ventricle has normal function. The left ventricle has no  regional wall  motion abnormalities. The left ventricular internal cavity  size was normal in size. There is  borderline left ventricular hypertrophy. Left ventricular diastolic  parameters are consistent with Grade I diastolic dysfunction (impaired  relaxation).    Recent Labs: No results found for requested labs within last 8760 hours.  Recent Lipid Panel No results found for: CHOL, TRIG, HDL, CHOLHDL, VLDL, LDLCALC, LDLDIRECT  Physical Exam:    VS:  BP (!) 124/58   Pulse 70   Ht 5' 9"  (1.753 m)   Wt 144 lb 3.2 oz (65.4 kg)   SpO2 93%   BMI 21.29 kg/m     Wt Readings from Last 3 Encounters:  09/10/20 144 lb 3.2 oz (65.4 kg)  02/29/20 165 lb 9.6 oz (75.1 kg)  07/19/19 158 lb 3.2 oz (71.8 kg)     GEN: Patient is in no acute distress HEENT: Normal NECK: No JVD; No carotid bruits LYMPHATICS: No lymphadenopathy CARDIAC: Hear sounds regular, 2/6 systolic murmur at the apex. RESPIRATORY:  Clear to auscultation without rales, wheezing or rhonchi  ABDOMEN: Soft, non-tender, non-distended MUSCULOSKELETAL:  No edema; No deformity  SKIN: Warm and dry NEUROLOGIC:  Alert and oriented x 3 PSYCHIATRIC:  Normal affect   Signed, Jenean Lindau, MD  09/10/2020 3:51 PM    [facility]

## 2020-09-10 NOTE — Patient Instructions (Signed)

## 2021-01-17 IMAGING — DX PORTABLE CHEST - 1 VIEW
1 series · 1 of 1 positions shown · non-contrast
Comparison: Radiograph December 07, 2018.

CLINICAL DATA: Pleural effusion, shortness of breath.

EXAM:
PORTABLE CHEST 1 VIEW

[chest ap]
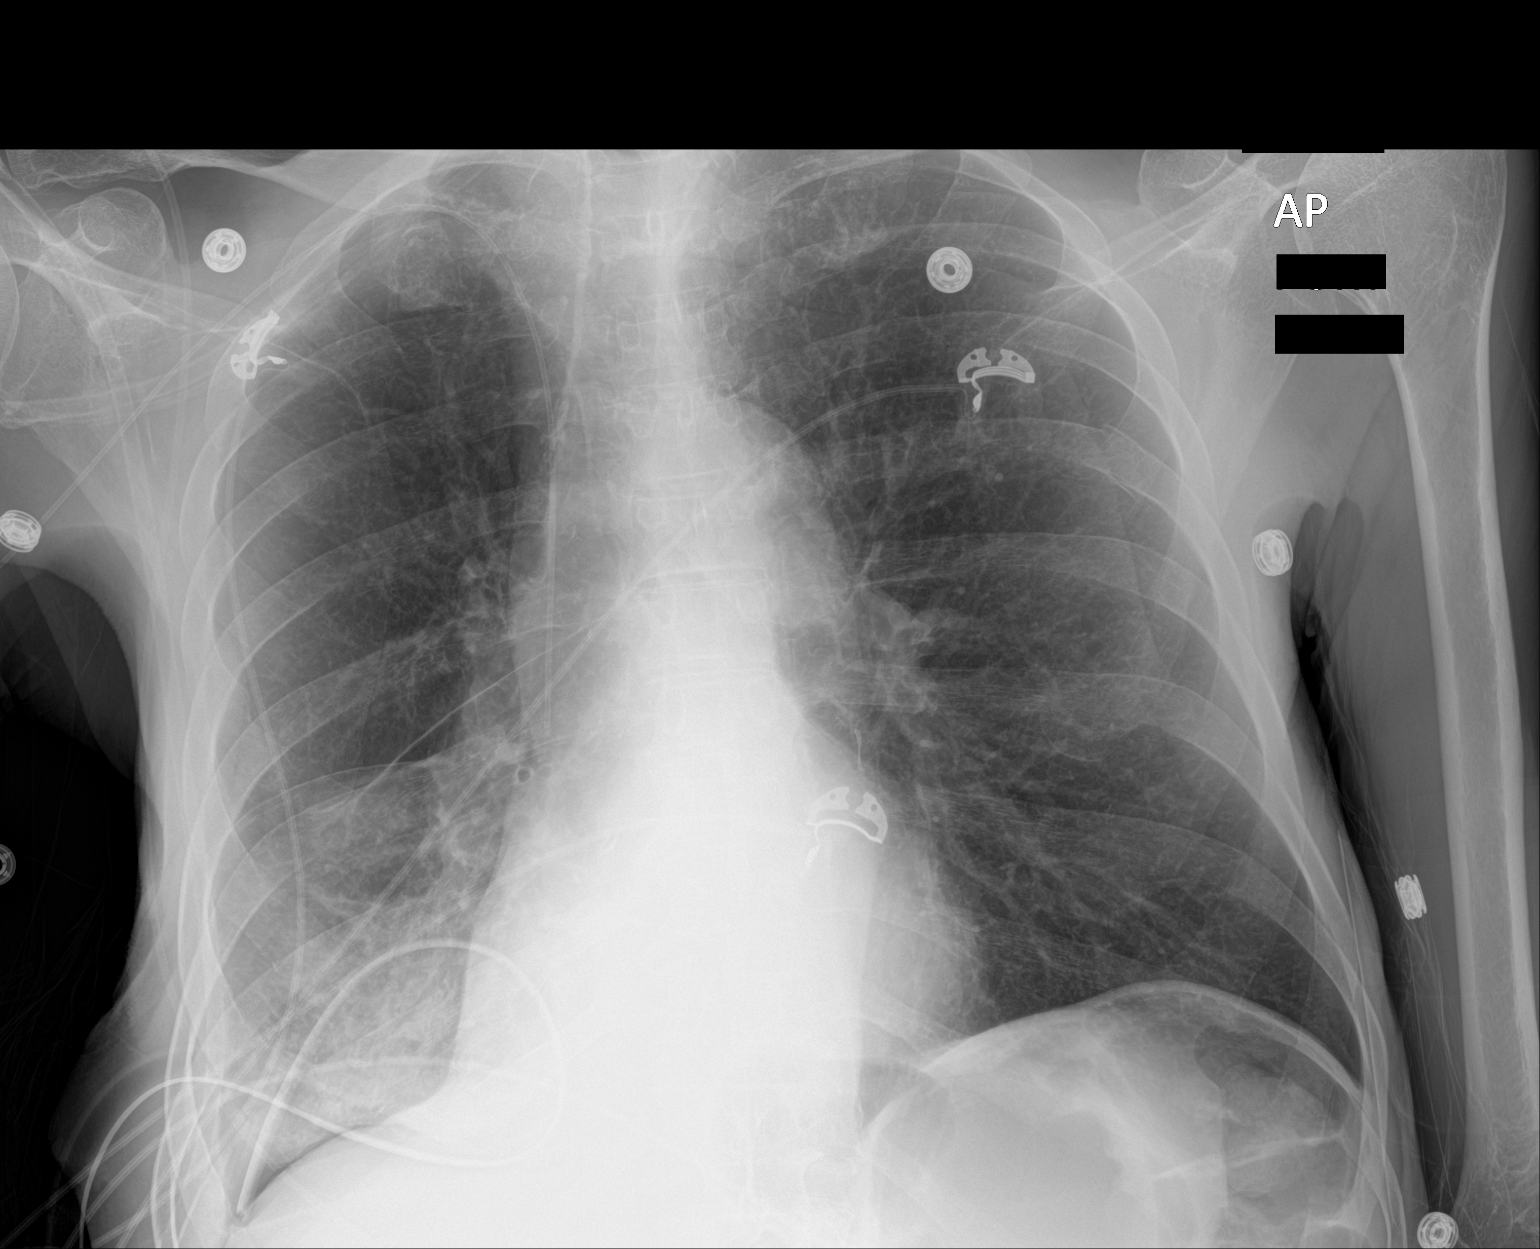

[1 of 1 positions shown; findings below may reference images not displayed]

FINDINGS: The heart size and mediastinal contours are within normal limits.
Left lung is clear. Right-sided PICC line is unchanged in position.
Stable right basilar atelectasis is noted. Stable position of right
sided pleural drainage catheter. Minimal pleural effusion may be
present. Stable minimal right basilar pneumothorax is noted. The
visualized skeletal structures are unremarkable.
IMPRESSION: Stable position of right-sided pleural drainage catheter is noted.
Stable small right basilar pneumothorax is noted with associated
atelectasis of the right lower lobe.

## 2021-03-17 ENCOUNTER — Other Ambulatory Visit: Payer: Self-pay

## 2021-03-17 ENCOUNTER — Encounter: Payer: Self-pay | Admitting: Cardiology

## 2021-03-17 ENCOUNTER — Ambulatory Visit: Payer: Medicare Other | Admitting: Cardiology

## 2021-03-17 ENCOUNTER — Ambulatory Visit (INDEPENDENT_AMBULATORY_CARE_PROVIDER_SITE_OTHER): Payer: Medicare Other | Admitting: Cardiology

## 2021-03-17 VITALS — BP 136/78 | HR 81 | Ht 70.0 in | Wt 134.4 lb

## 2021-03-17 DIAGNOSIS — I6523 Occlusion and stenosis of bilateral carotid arteries: Secondary | ICD-10-CM

## 2021-03-17 DIAGNOSIS — F17210 Nicotine dependence, cigarettes, uncomplicated: Secondary | ICD-10-CM | POA: Diagnosis not present

## 2021-03-17 DIAGNOSIS — E782 Mixed hyperlipidemia: Secondary | ICD-10-CM | POA: Diagnosis not present

## 2021-03-17 DIAGNOSIS — I10 Essential (primary) hypertension: Secondary | ICD-10-CM

## 2021-03-17 NOTE — Patient Instructions (Signed)
Medication Instructions:  Your physician recommends that you continue on your current medications as directed. Please refer to the Current Medication list given to you today.  *If you need a refill on your cardiac medications before your next appointment, please call your pharmacy*   Lab Work: Your physician recommends that you have labs done in the office today. Your test included  basic metabolic panel, TSH, liver function and lipids.  If you have labs (blood work) drawn today and your tests are completely normal, you will receive your results only by: MyChart Message (if you have MyChart) OR A paper copy in the mail If you have any lab test that is abnormal or we need to change your treatment, we will call you to review the results.   Testing/Procedures: None ordered   Follow-Up: At [facility], you and your health needs are our priority.  As part of our continuing mission to provide you with exceptional heart care, we have created designated Provider Care Teams.  These Care Teams include your primary Cardiologist (physician) and Advanced Practice Providers (APPs -  Physician Assistants and Nurse Practitioners) who all work together to provide you with the care you need, when you need it.  We recommend signing up for the patient portal called "MyChart".  Sign up information is provided on this After Visit Summary.  MyChart is used to connect with patients for Virtual Visits (Telemedicine).  Patients are able to view lab/test results, encounter notes, upcoming appointments, etc.  Non-urgent messages can be sent to your provider as well.   To learn more about what you can do with MyChart, go to [email or web address].    Your next appointment:   6 month(s)  The format for your next appointment:   In Person  Provider:   Rajan Revankar, MD   Other Instructions NA   

## 2021-03-17 NOTE — Progress Notes (Signed)
Cardiology Office Note:    Date:  03/17/2021   ID:  Randy Page, DOB 09/26/1955, MRN [id number]  PCP:  [name], MD  Cardiologist:  Jenean Lindau, MD   Referring MD: [name], MD    ASSESSMENT:    1. Essential hypertension   2. Mixed dyslipidemia   3. Bilateral carotid artery stenosis   4. Cigarette smoker    PLAN:    In order of problems listed above:  Atherosclerotic vascular disease: Secondary prevention stressed with the patient.  Importance of compliance with diet and medication stressed and he vocalized understanding. Essential hypertension: Blood pressure stable and diet was emphasized. Mixed dyslipidemia: Lipids reviewed current diet was discussed with the patient. Cigarette smoker: I spent 5 minutes with the patient discussing solely about smoking. Smoking cessation was counseled. I suggested to the patient also different medications and pharmacological interventions. Patient is keen to try stopping on its own at this time. He will get back to me if he needs any further assistance in this matter. Patient will be seen in follow-up appointment in 6 months or earlier if the patient has any concerns    Medication Adjustments/Labs and Tests Ordered: Current medicines are reviewed at length with the patient today.  Concerns regarding medicines are outlined above.  Orders Placed This Encounter  Procedures   Basic metabolic panel   TSH   Lipid panel   Hepatic function panel   EKG 12-Lead   No orders of the defined types were placed in this encounter.    No chief complaint on file.    History of Present Illness:    Randy Page is a 65 y.o. male.  Patient has past medical history of atherosclerotic vascular disease, carotid artery stenosis, essential hypertension mixed dyslipidemia and cigarette smoking.  He denies any problems at this time and takes care of activities of daily living.  Overall he leads a sedentary lifestyle.  At the time of  my evaluation, the patient is alert awake oriented and in no distress.  Unfortunately continues to smoke.  Past Medical History:  Diagnosis Date   Atherosclerotic vascular disease 09/15/2017   Carotid artery disease ([place]) 07/19/2019   Carotid artery stenosis 09/15/2017   Chronic pain 12/02/2018   Chronically on opiate therapy    Cigarette smoker 09/15/2017   COPD (chronic obstructive pulmonary disease) ([place])    per patient   Decubital ulcer 12/2018   buttocks   Decubitus ulcer 12/02/2018   Decubitus ulcer of coccyx, unspecified pressure ulcer stage 05/04/2019   Duodenal ulcer 12/02/2018   Elevated INR 12/03/2018   Empyema ([place]) 12/05/2018   Empyema lung ([place]) 12/2018   Empyema of right pleural space ([place]) 12/02/2018   Essential hypertension 09/15/2017   History of decubitus ulcer 12/20/2019   Hypercholesteremia    Hyperlipidemia    Hypertension    Low back pain    Mixed dyslipidemia 09/15/2017   MSSA bacteremia 12/02/2018   Onychomycosis due to dermatophyte 02/15/2020   SOB (shortness of breath) on exertion 09/15/2017   Thyrotoxicosis    Tinea pedis of both feet 02/15/2020   Vitamin D deficiency     Past Surgical History:  Procedure Laterality Date   HERNIA REPAIR     KNEE ARTHROSCOPY     NECK SURGERY     VIDEO ASSISTED THORACOSCOPY (VATS)/EMPYEMA Right 12/05/2018   Procedure: RIGHT VIDEO ASSISTED THORACOSCOPY DRAIN EMPYEMA/LUNG ABSCESS;  Surgeon: Gaye Pollack, MD;  Location: [facility];  Service: Thoracic;  Laterality: Right;   WOUND DEBRIDEMENT Right 12/05/2018   Procedure: Excisional Debridement Right Sacral Wound;  Surgeon: Greer Pickerel, MD;  Location: [facility];  Service: General;  Laterality: Right;    Current Medications: Current Meds  Medication Sig   aspirin EC 81 MG tablet Take 1 tablet (81 mg total) by mouth daily.   atorvastatin (LIPITOR) 20 MG tablet Take 20 mg by mouth at bedtime.   carvedilol (COREG) 25 MG tablet Take 25 mg by mouth 2 (two) times daily.   diazepam (VALIUM) 2  MG tablet Take 2 mg by mouth at bedtime.   docusate sodium (COLACE) 100 MG capsule Take 1 capsule (100 mg total) by mouth 2 (two) times daily as needed for mild constipation.   doxepin (SINEQUAN) 100 MG capsule Take 100 mg by mouth at bedtime.   ferrous sulfate 324 (65 Fe) MG TBEC Take 1 tablet by mouth daily.   gabapentin (NEURONTIN) 600 MG tablet Take 600 mg by mouth 4 (four) times daily.   NARCAN 4 MG/0.1ML LIQD nasal spray kit Place 1 spray into the nose once.   ondansetron (ZOFRAN) 4 MG tablet Take 1 tablet (4 mg total) by mouth every 6 (six) hours as needed for nausea.   oxyCODONE (ROXICODONE) 15 MG immediate release tablet Take 15 mg by mouth 4 (four) times daily.   pantoprazole (PROTONIX) 40 MG tablet Take 40 mg by mouth daily.   PROAIR HFA 108 (90 Base) MCG/ACT inhaler Inhale 2 puffs into the lungs 3 (three) times daily.    tamsulosin (FLOMAX) 0.4 MG CAPS capsule Take 0.4 mg by mouth daily.   terbinafine (LAMISIL) 250 MG tablet Take 250 mg by mouth daily.   tiZANidine (ZANAFLEX) 4 MG tablet Take 4 mg by mouth at bedtime.     Allergies:   Aspirin   Social History   Socioeconomic History   Marital status: Single    Spouse name: Not on file   Number of children: Not on file   Years of education: Not on file   Highest education level: Not on file  Occupational History   Not on file  Tobacco Use   Smoking status: Every Day    Packs/day: 0.50    Years: 0.00    Pack years: 0.00    Types: Cigarettes   Smokeless tobacco: Never   Tobacco comments:    18 year pack history, smokes 2 a day  Vaping Use   Vaping Use: Never used  Substance and Sexual Activity   Alcohol use: Not Currently   Drug use: Never   Sexual activity: Not on file  Other Topics Concern   Not on file  Social History Narrative   Not on file   Social Determinants of Health   Financial Resource Strain: Not on file  Food Insecurity: Not on file  Transportation Needs: Not on file  Physical Activity: Not on  file  Stress: Not on file  Social Connections: Not on file     Family History: The patient's Family history is unknown by patient.  ROS:   Please see the history of present illness.    All other systems reviewed and are negative.  EKGs/Labs/Other Studies Reviewed:    The following studies were reviewed today: I discussed my findings with the patient at length.   Recent Labs: No results found for requested labs within last 8760 hours.  Recent Lipid Panel No results found for: CHOL, TRIG, HDL, CHOLHDL, VLDL, LDLCALC, LDLDIRECT  Physical Exam:  VS:  BP 136/78   Pulse 81   Ht 5' 10"  (1.778 m)   Wt 134 lb 6.4 oz (61 kg)   SpO2 94%   BMI 19.28 kg/m     Wt Readings from Last 3 Encounters:  03/17/21 134 lb 6.4 oz (61 kg)  09/10/20 144 lb 3.2 oz (65.4 kg)  02/29/20 165 lb 9.6 oz (75.1 kg)     GEN: Patient is in no acute distress HEENT: Normal NECK: No JVD; No carotid bruits LYMPHATICS: No lymphadenopathy CARDIAC: Hear sounds regular, 2/6 systolic murmur at the apex. RESPIRATORY:  Clear to auscultation without rales, wheezing or rhonchi  ABDOMEN: Soft, non-tender, non-distended MUSCULOSKELETAL:  No edema; No deformity  SKIN: Warm and dry NEUROLOGIC:  Alert and oriented x 3 PSYCHIATRIC:  Normal affect   Signed, Jenean Lindau, MD  03/17/2021 5:44 PM    [facility]

## 2021-03-18 LAB — HEPATIC FUNCTION PANEL
ALT: 12 IU/L (ref 0–44)
AST: 23 IU/L (ref 0–40)
Albumin: 3.7 g/dL — ABNORMAL LOW (ref 3.8–4.8)
Alkaline Phosphatase: 103 IU/L (ref 44–121)
Bilirubin Total: 0.3 mg/dL (ref 0.0–1.2)
Bilirubin, Direct: 0.1 mg/dL (ref 0.00–0.40)
Total Protein: 7 g/dL (ref 6.0–8.5)

## 2021-03-18 LAB — LIPID PANEL
Chol/HDL Ratio: 2.5 ratio (ref 0.0–5.0)
Cholesterol, Total: 103 mg/dL (ref 100–199)
HDL: 42 mg/dL (ref >39)
LDL Chol Calc (NIH): 47 mg/dL (ref 0–99)
Triglycerides: 61 mg/dL (ref 0–149)
VLDL Cholesterol Cal: 14 mg/dL (ref 5–40)

## 2021-03-18 LAB — BASIC METABOLIC PANEL
BUN/Creatinine Ratio: 5 — ABNORMAL LOW (ref 10–24)
BUN: 4 mg/dL — ABNORMAL LOW (ref 8–27)
CO2: 29 mmol/L (ref 20–29)
Calcium: 9.1 mg/dL (ref 8.6–10.2)
Chloride: 102 mmol/L (ref 96–106)
Creatinine, Ser: 0.84 mg/dL (ref 0.76–1.27)
Glucose: 56 mg/dL — ABNORMAL LOW (ref 65–99)
Potassium: 4.1 mmol/L (ref 3.5–5.2)
Sodium: 142 mmol/L (ref 134–144)
eGFR: 96 mL/min/{1.73_m2} (ref >59)

## 2021-03-18 LAB — TSH: TSH: 0.956 u[IU]/mL (ref 0.450–4.500)

## 2021-09-15 ENCOUNTER — Ambulatory Visit: Payer: Medicare Other | Admitting: Cardiology

## 2021-09-30 ENCOUNTER — Ambulatory Visit: Payer: Medicare Other | Admitting: Cardiology

## 2021-10-03 ENCOUNTER — Encounter: Payer: Self-pay | Admitting: Cardiology

## 2021-10-03 ENCOUNTER — Encounter: Payer: Self-pay | Admitting: *Deleted

## 2021-10-03 ENCOUNTER — Ambulatory Visit (INDEPENDENT_AMBULATORY_CARE_PROVIDER_SITE_OTHER): Payer: Medicare Other | Admitting: Cardiology

## 2021-10-03 VITALS — BP 160/84 | HR 64 | Ht 70.0 in | Wt 143.6 lb

## 2021-10-03 DIAGNOSIS — I10 Essential (primary) hypertension: Secondary | ICD-10-CM

## 2021-10-03 DIAGNOSIS — E7800 Pure hypercholesterolemia, unspecified: Secondary | ICD-10-CM

## 2021-10-03 DIAGNOSIS — I7090 Unspecified atherosclerosis: Secondary | ICD-10-CM | POA: Diagnosis not present

## 2021-10-03 DIAGNOSIS — F17210 Nicotine dependence, cigarettes, uncomplicated: Secondary | ICD-10-CM | POA: Diagnosis not present

## 2021-10-03 DIAGNOSIS — E782 Mixed hyperlipidemia: Secondary | ICD-10-CM

## 2021-10-03 NOTE — Progress Notes (Signed)
?Cardiology Office Note:   ? ?Date:  10/03/2021  ? ?ID:  Randy Page, DOB [birth year], MRN [id number] ? ?PCP:  [name], MD  ?Cardiologist:  Jenean Lindau, MD  ? ?Referring MD: [name], MD  ? ? ?ASSESSMENT:   ? ?1. Atherosclerotic vascular disease   ?2. Essential hypertension   ?3. Hypercholesteremia   ?4. Cigarette smoker   ?5. Mixed dyslipidemia   ? ?PLAN:   ? ?In order of problems listed above: ? ?Atherosclerotic vascular disease: Carotid atherosclerosis: Secondary prevention stressed with the patient.  Importance of compliance with diet medication stressed any vocalized understanding.  He was advised to ambulate to the best of his ability. ?Essential hypertension: Blood pressure stable and diet was emphasized.  Lifestyle modification urged.  His blood pressure stable.  He mentions blood pressure readings at home and they are fine.  He has an element of whitecoat hypertension. ?Mixed dyslipidemia: Diet emphasized.  He is fasting and will have complete blood work today. ?Cigarette smoker: I spent 5 minutes with the patient discussing solely about smoking. Smoking cessation was counseled. I suggested to the patient also different medications and pharmacological interventions. Patient is keen to try stopping on its own at this time. He will get back to me if he needs any further assistance in this matter. ?Patient will be seen in follow-up appointment in 9 months or earlier if the patient has any concerns ? ? ? ?Medication Adjustments/Labs and Tests Ordered: ?Current medicines are reviewed at length with the patient today.  Concerns regarding medicines are outlined above.  ?No orders of the defined types were placed in this encounter. ? ?No orders of the defined types were placed in this encounter. ? ? ? ?Chief Complaint  ?Patient presents with  ? yearly follow up  ?  ? ?History of Present Illness:   ? ?Randy Page is a 66 y.o. male.  Patient has past medical history of atherosclerotic  vascular disease and carotid atherosclerosis, essential hypertension, dyslipidemia and active smoking.  He denies any problems at this time.  He ambulates with a cane.  No chest pain orthopnea or PND.  At the time of my evaluation, the patient is alert awake oriented and in no distress. ? ?Past Medical History:  ?Diagnosis Date  ? Atherosclerotic vascular disease 09/15/2017  ? Carotid artery disease ([place]) 07/19/2019  ? Carotid artery stenosis 09/15/2017  ? Chronic pain 12/02/2018  ? Chronically on opiate therapy   ? Cigarette smoker 09/15/2017  ? COPD (chronic obstructive pulmonary disease) ([place])   ? per patient  ? Decubital ulcer 12/2018  ? buttocks  ? Decubitus ulcer 12/02/2018  ? Decubitus ulcer of coccyx, unspecified pressure ulcer stage 05/04/2019  ? Duodenal ulcer 12/02/2018  ? Elevated INR 12/03/2018  ? Empyema ([place]) 12/05/2018  ? Empyema lung ([place]) 12/2018  ? Empyema of right pleural space ([place]) 12/02/2018  ? Essential hypertension 09/15/2017  ? History of decubitus ulcer 12/20/2019  ? Hypercholesteremia   ? Hyperlipidemia   ? Hypertension   ? Low back pain   ? Mixed dyslipidemia 09/15/2017  ? MSSA bacteremia 12/02/2018  ? Onychomycosis due to dermatophyte 02/15/2020  ? SOB (shortness of breath) on exertion 09/15/2017  ? Thyrotoxicosis   ? Tinea pedis of both feet 02/15/2020  ? Vitamin D deficiency   ? ? ?Past Surgical History:  ?Procedure Laterality Date  ? HERNIA REPAIR    ? KNEE ARTHROSCOPY    ? NECK SURGERY    ?  VIDEO ASSISTED THORACOSCOPY (VATS)/EMPYEMA Right 12/05/2018  ? Procedure: RIGHT VIDEO ASSISTED THORACOSCOPY DRAIN EMPYEMA/LUNG ABSCESS;  Surgeon: Gaye Pollack, MD;  Location: [facility];  Service: Thoracic;  Laterality: Right;  ? WOUND DEBRIDEMENT Right 12/05/2018  ? Procedure: Excisional Debridement Right Sacral Wound;  Surgeon: Greer Pickerel, MD;  Location: [facility];  Service: General;  Laterality: Right;  ? ? ?Current Medications: ?Current Meds  ?Medication Sig  ? aspirin EC 81 MG tablet Take 1 tablet (81 mg total) by  mouth daily.  ? atorvastatin (LIPITOR) 20 MG tablet Take 20 mg by mouth at bedtime.  ? carvedilol (COREG) 25 MG tablet Take 25 mg by mouth 2 (two) times daily.  ? diazepam (VALIUM) 2 MG tablet Take 2 mg by mouth at bedtime.  ? docusate sodium (COLACE) 100 MG capsule Take 1 capsule (100 mg total) by mouth 2 (two) times daily as needed for mild constipation.  ? doxepin (SINEQUAN) 100 MG capsule Take 100 mg by mouth at bedtime.  ? ferrous sulfate 324 (65 Fe) MG TBEC Take 1 tablet by mouth daily.  ? gabapentin (NEURONTIN) 600 MG tablet Take 600 mg by mouth 4 (four) times daily.  ? NARCAN 4 MG/0.1ML LIQD nasal spray kit Place 1 spray into the nose once.  ? ondansetron (ZOFRAN) 4 MG tablet Take 1 tablet (4 mg total) by mouth every 6 (six) hours as needed for nausea.  ? pantoprazole (PROTONIX) 40 MG tablet Take 40 mg by mouth daily.  ? PROAIR HFA 108 (90 Base) MCG/ACT inhaler Inhale 2 puffs into the lungs 3 (three) times daily.   ? tamsulosin (FLOMAX) 0.4 MG CAPS capsule Take 0.4 mg by mouth daily.  ? terbinafine (LAMISIL) 250 MG tablet Take 250 mg by mouth daily.  ?  ? ?Allergies:   Aspirin  ? ?Social History  ? ?Socioeconomic History  ? Marital status: Single  ?  Spouse name: Not on file  ? Number of children: Not on file  ? Years of education: Not on file  ? Highest education level: Not on file  ?Occupational History  ? Not on file  ?Tobacco Use  ? Smoking status: Every Day  ?  Packs/day: 0.50  ?  Years: 0.00  ?  Pack years: 0.00  ?  Types: Cigarettes  ? Smokeless tobacco: Never  ? Tobacco comments:  ?  18 year pack history, smokes 2 a day  ?Vaping Use  ? Vaping Use: Never used  ?Substance and Sexual Activity  ? Alcohol use: Not Currently  ? Drug use: Never  ? Sexual activity: Not on file  ?Other Topics Concern  ? Not on file  ?Social History Narrative  ? Not on file  ? ?Social Determinants of Health  ? ?Financial Resource Strain: Not on file  ?Food Insecurity: Not on file  ?Transportation Needs: Not on file  ?Physical  Activity: Not on file  ?Stress: Not on file  ?Social Connections: Not on file  ?  ? ?Family History: ?The patient's Family history is unknown by patient. ? ?ROS:   ?Please see the history of present illness.    ?All other systems reviewed and are negative. ? ?EKGs/Labs/Other Studies Reviewed:   ? ?The following studies were reviewed today: ?I discussed my diagnosis with the patient at length. ? ? ?Recent Labs: ?03/17/2021: ALT 12; BUN 4; Creatinine, Ser 0.84; Potassium 4.1; Sodium 142; TSH 0.956  ?Recent Lipid Panel ?   ?Component Value Date/Time  ? CHOL 103 03/17/2021 1200  ? TRIG  61 03/17/2021 1200  ? HDL 42 03/17/2021 1200  ? CHOLHDL 2.5 03/17/2021 1200  ? LDLCALC 47 03/17/2021 1200  ? ? ?Physical Exam:   ? ?VS:  BP (!) 160/84 (BP Location: Left Arm, Patient Position: Sitting)   Pulse 64   Ht _0  (1.778 m)   Wt 143 lb 9.6 oz (65.1 kg)   SpO2 96%   BMI 20.60 kg/m?    ? ?Wt Readings from Last 3 Encounters:  ?10/03/21 143 lb 9.6 oz (65.1 kg)  ?03/17/21 134 lb 6.4 oz (61 kg)  ?09/10/20 144 lb 3.2 oz (65.4 kg)  ?  ? ?GEN: Patient is in no acute distress ?HEENT: Normal ?NECK: No JVD; No carotid bruits ?LYMPHATICS: No lymphadenopathy ?CARDIAC: Hear sounds regular, 2/6 systolic murmur at the apex. ?RESPIRATORY:  Clear to auscultation without rales, wheezing or rhonchi  ?ABDOMEN: Soft, non-tender, non-distended ?MUSCULOSKELETAL:  No edema; No deformity  ?SKIN: Warm and dry ?NEUROLOGIC:  Alert and oriented x 3 ?PSYCHIATRIC:  Normal affect  ? ?Signed, ?Jenean Lindau, MD  ?10/03/2021 9:07 AM    ?[place]  ?

## 2021-10-03 NOTE — Patient Instructions (Signed)
Medication Instructions:  ?Your physician recommends that you continue on your current medications as directed. Please refer to the Current Medication list given to you today.  ?*If you need a refill on your cardiac medications before your next appointment, please call your pharmacy* ? ? ?Lab Work: ?None Ordered ?If you have labs (blood work) drawn today and your tests are completely normal, you will receive your results only by: ?MyChart Message (if you have MyChart) OR ?A paper copy in the mail ?If you have any lab test that is abnormal or we need to change your treatment, we will call you to review the results. ? ? ?Testing/Procedures: ?None Ordered ? ? ?Follow-Up: ?At [facility], you and your health needs are our priority.  As part of our continuing mission to provide you with exceptional heart care, we have created designated Provider Care Teams.  These Care Teams include your primary Cardiologist (physician) and Advanced Practice Providers (APPs -  Physician Assistants and Nurse Practitioners) who all work together to provide you with the care you need, when you need it. ? ?We recommend signing up for the patient portal called "MyChart".  Sign up information is provided on this After Visit Summary.  MyChart is used to connect with patients for Virtual Visits (Telemedicine).  Patients are able to view lab/test results, encounter notes, upcoming appointments, etc.  Non-urgent messages can be sent to your provider as well.   ?To learn more about what you can do with MyChart, go to [email or web address].   ? ?Your next appointment:   ?9 month(s) ? ?The format for your next appointment:   ?In Person ? ?Provider:   ?Rajan Revankar, MD  ? ? ?Other Instructions ?None  ?

## 2022-07-01 ENCOUNTER — Ambulatory Visit: Payer: Medicare Other | Admitting: Cardiology

## 2022-07-07 ENCOUNTER — Ambulatory Visit: Payer: Medicare Other | Admitting: Cardiology

## 2022-07-09 ENCOUNTER — Ambulatory Visit: Payer: Medicare Other | Admitting: Cardiology

## 2022-09-09 ENCOUNTER — Ambulatory Visit: Payer: Medicare Other | Admitting: Cardiology

## 2022-10-20 ENCOUNTER — Other Ambulatory Visit: Payer: Self-pay

## 2022-10-26 ENCOUNTER — Ambulatory Visit: Payer: 59 | Attending: Cardiology | Admitting: Cardiology

## 2022-11-03 ENCOUNTER — Encounter: Payer: Self-pay | Admitting: Cardiology

## 2022-12-10 ENCOUNTER — Other Ambulatory Visit: Payer: Self-pay

## 2022-12-24 ENCOUNTER — Ambulatory Visit: Payer: 59 | Attending: Cardiology | Admitting: Cardiology

## 2023-04-14 ENCOUNTER — Ambulatory Visit: Payer: 59 | Admitting: Cardiology

## 2023-04-16 ENCOUNTER — Other Ambulatory Visit: Payer: Self-pay

## 2023-04-19 NOTE — Progress Notes (Unsigned)
Cardiology Office Note:  .   Date:  04/20/2023  ID:  Randy Page, DOB 10-04-1955, MRN [id number] PCP: Karle Plumber, MD  [facility] Providers Cardiologist:  Garwin Brothers, MD    History of Present Illness: .   Randy Page is a 67 y.o. male with a past medical history of hypertension, carotid artery stenosis, COPD, dyslipidemia, tobacco abuse.  09/09/2019 carotid ultrasound bilateral mild carotid artery stenosis 09/08/2019 echo EF 60 to 65%, borderline LVH, grade 1 DD, mildly elevated PASP, no valvular abnormalities 10/07/2017 Lexiscan  Most recently evaluated by Dr. Tomie China on 10/03/2021, was stable from a cardiac perspective, blood pressure was elevated and he was advised he could follow-up in 9 months.  He presents today for follow-up of his hypertension.  He endorses a high level of personal stress lately, also had episodes of chest pain with occasional left arm pain, pain can come at rest or with exertion, hard for him to describe how long it lasts and any relieving factors.  He does continue to smoke, not interested in cessation at this time.  He has some shortness of breath however he attributes this to his COPD and has not any worse than it traditionally is.  He denies palpitations, dyspnea, pnd, orthopnea, n, v, dizziness, syncope, edema, weight gain, or early satiety.     ROS: Review of Systems  Constitutional:  Positive for weight loss.  HENT: Negative.    Eyes: Negative.   Respiratory:  Positive for shortness of breath and wheezing.   Cardiovascular:  Positive for chest pain.  Gastrointestinal: Negative.   Genitourinary: Negative.   Musculoskeletal:  Positive for neck pain.  Skin: Negative.   Neurological: Negative.   Endo/Heme/Allergies: Negative.   Psychiatric/Behavioral: Negative.      Studies Reviewed: Marland Kitchen   EKG Interpretation Date/Time:  Tuesday April 20 2023 08:00:37 EDT Ventricular Rate:  56 PR Interval:  180 QRS Duration:  98 QT  Interval:  430 QTC Calculation: 414 R Axis:   64  Text Interpretation: Sinus bradycardia Otherwise normal ECG When compared with ECG of 06-Dec-2018 22:26, Vent. rate has decreased BY 103 BPM Nonspecific T wave abnormality no longer evident in Inferior leads T wave amplitude has increased in Anterolateral leads Confirmed by Wallis Bamberg [phone]) on 04/20/2023 8:00:28 AM    Cardiac Studies & Procedures     STRESS TESTS  MYOCARDIAL PERFUSION IMAGING 10/07/2017  Narrative  Nuclear stress EF: 60%.  The left ventricular ejection fraction is normal (55-65%).  No T wave inversion was noted during stress.  There was no ST segment deviation noted during stress.  Defect 1: There is a medium defect of moderate severity.  This is a low risk study.  Medium size, moderate intensity fixed inferior bowel attenuation artifact. No reversible ischemia. LVEF 60% with normal wall motion. This is a low risk study.   ECHOCARDIOGRAM  ECHOCARDIOGRAM COMPLETE 09/08/2019  Narrative ECHOCARDIOGRAM REPORT    Patient Name:   Randy Page Date of Exam: 09/08/2019 Medical Rec #:  [id number]      Height:       70.0 in Accession #:    [id number]     Weight:       158.2 lb Date of Birth:  03/23/1956      BSA:          1.889 m Patient Age:    63 years       BP:  132/64 mmHg Patient Gender: M              HR:           60 bpm. Exam Location:  [facility]  Procedure: 2D Echo  Indications:    Essential hypertension [I10] - Primary  History:        Patient has no prior history of Echocardiogram examinations.  Sonographer:    Louie Boston Referring Phys: Rito Ehrlich [facility]  IMPRESSIONS   1. Left ventricular ejection fraction, by estimation, is 60 to 65%. The left ventricle has normal function. The left ventricle has no regional wall motion abnormalities. Left ventricular diastolic parameters are consistent with Grade I diastolic dysfunction (impaired relaxation).  FINDINGS Left Ventricle: Left  ventricular ejection fraction, by estimation, is 60 to 65%. The left ventricle has normal function. The left ventricle has no regional wall motion abnormalities. The left ventricular internal cavity size was normal in size. There is borderline left ventricular hypertrophy. Left ventricular diastolic parameters are consistent with Grade I diastolic dysfunction (impaired relaxation).  Right Ventricle: The right ventricular size is normal. No increase in right ventricular wall thickness. Right ventricular systolic function is normal. There is mildly elevated pulmonary artery systolic pressure. The tricuspid regurgitant velocity is 2.67 m/s, and with an assumed right atrial pressure of 3 mmHg, the estimated right ventricular systolic pressure is 31.5 mmHg.  Left Atrium: Left atrial size was normal in size.  Right Atrium: Right atrial size was normal in size.  Pericardium: There is no evidence of pericardial effusion.  Mitral Valve: The mitral valve is normal in structure and function. Normal mobility of the mitral valve leaflets. No evidence of mitral valve regurgitation. No evidence of mitral valve stenosis.  Tricuspid Valve: The tricuspid valve is normal in structure. Tricuspid valve regurgitation is trivial. No evidence of tricuspid stenosis.  Aortic Valve: The aortic valve is normal in structure and function. Aortic valve regurgitation is not visualized. No aortic stenosis is present.  Pulmonic Valve: The pulmonic valve was normal in structure. Pulmonic valve regurgitation is not visualized. No evidence of pulmonic stenosis.  Aorta: The aortic root is normal in size and structure.  Venous: The inferior vena cava is normal in size with greater than 50% respiratory variability, suggesting right atrial pressure of 3 mmHg.  IAS/Shunts: No atrial level shunt detected by color flow Doppler.   LEFT VENTRICLE PLAX 2D LVIDd:         4.30 cm  Diastology LVIDs:         3.00 cm  LV e' lateral:    7.62 cm/s LV PW:         1.30 cm  LV E/e' lateral: 7.7 LV IVS:        1.20 cm  LV e' medial:    5.98 cm/s LVOT diam:     2.00 cm  LV E/e' medial:  9.8 LV SV:         67 LV SV Index:   36 LVOT Area:     3.14 cm   RIGHT VENTRICLE            IVC RV S prime:     8.70 cm/s  IVC diam: 1.30 cm TAPSE (M-mode): 2.4 cm  LEFT ATRIUM             Index       RIGHT ATRIUM           Index LA diam:        3.30  cm 1.75 cm/m  RA Area:     15.00 cm LA Vol (A2C):   64.8 ml 34.30 ml/m RA Volume:   37.00 ml  19.58 ml/m LA Vol (A4C):   54.3 ml 28.74 ml/m LA Biplane Vol: 61.2 ml 32.39 ml/m AORTIC VALVE LVOT Vmax:   100.00 cm/s LVOT Vmean:  65.800 cm/s LVOT VTI:    0.214 m  AORTA Ao Root diam: 3.80 cm Ao Asc diam:  3.50 cm  MITRAL VALVE               TRICUSPID VALVE MV Area (PHT): 3.65 cm    TR Peak grad:   28.5 mmHg MV Decel Time: 208 msec    TR Vmax:        267.00 cm/s MV E velocity: 58.70 cm/s MV A velocity: 48.00 cm/s  SHUNTS MV E/A ratio:  1.22        Systemic VTI:  0.21 m Systemic Diam: 2.00 cm  Belva Crome MD Electronically signed by Belva Crome MD Signature Date/Time: 09/08/2019/6:05:07 PM    Final             Risk Assessment/Calculations:             Physical Exam:   VS:  BP 119/66 Comment: home bp reading  Pulse (!) 56   Ht 5\' 10"  (1.778 m)   Wt 134 lb (60.8 kg)   SpO2 99%   BMI 19.23 kg/m    Wt Readings from Last 3 Encounters:  04/20/23 134 lb (60.8 kg)  10/03/21 143 lb 9.6 oz (65.1 kg)  03/17/21 134 lb 6.4 oz (61 kg)    GEN: Thin,  in no acute distress NECK: No JVD; No carotid bruits CARDIAC: RRR, no murmurs, rubs, gallops RESPIRATORY:  Clear to auscultation without rales, wheezing or rhonchi  ABDOMEN: Soft, non-tender, non-distended EXTREMITIES:  No edema; No deformity   ASSESSMENT AND PLAN: .   Precordial pain-pain has mixed features, does not sound to be anginal in nature however he does have multiple comorbid conditions including hypertension,  dyslipidemia, current tobacco use.  Will arrange for coronary CTA for further evaluation.  Will check kidney function today.  Heart rate is traditionally low, advised him he will take his Coreg the morning of his coronary CTA. Unexpected weight loss-likely multifactorial, will check CBC for any contributory causes. Carotid artery stenosis-mild bilaterally per most recent carotid ultrasound in 2021, no bruit appreciated today. Hypertension-blood pressure mildly elevated in the office today however he did not take his Coreg as he was instructed to not take it if his systolic is less than 120, blood pressure at home this morning was 119/66.  Continue Coreg 25 mg twice daily. Dyslipidemia-most recent LDL was 47 on 03/18/2021, will repeat FLP and LFTs, continue Lipitor 20 mg daily Tobacco abuse-continues to smoke approximately 10 cigarettes a day, he is not interested in cessation at this time       Dispo: Coronary CTA, CBC, CMET, fasting lipid panel.  Follow-up depending testing as outlined above.  Signed, Flossie Dibble, NP

## 2023-04-20 ENCOUNTER — Encounter: Payer: Self-pay | Admitting: Cardiology

## 2023-04-20 ENCOUNTER — Ambulatory Visit: Payer: 59 | Attending: Cardiology | Admitting: Cardiology

## 2023-04-20 VITALS — BP 119/66 | HR 56 | Ht 70.0 in | Wt 134.0 lb

## 2023-04-20 DIAGNOSIS — I6523 Occlusion and stenosis of bilateral carotid arteries: Secondary | ICD-10-CM

## 2023-04-20 DIAGNOSIS — R072 Precordial pain: Secondary | ICD-10-CM | POA: Diagnosis not present

## 2023-04-20 DIAGNOSIS — F17210 Nicotine dependence, cigarettes, uncomplicated: Secondary | ICD-10-CM | POA: Diagnosis not present

## 2023-04-20 DIAGNOSIS — I10 Essential (primary) hypertension: Secondary | ICD-10-CM | POA: Diagnosis not present

## 2023-04-20 DIAGNOSIS — E782 Mixed hyperlipidemia: Secondary | ICD-10-CM

## 2023-04-20 DIAGNOSIS — Z79899 Other long term (current) drug therapy: Secondary | ICD-10-CM

## 2023-04-20 DIAGNOSIS — R634 Abnormal weight loss: Secondary | ICD-10-CM

## 2023-04-20 NOTE — Patient Instructions (Addendum)
Medication Instructions:  Your physician recommends that you continue on your current medications as directed. Please refer to the Current Medication list given to you today.  *If you need a refill on your cardiac medications before your next appointment, please call your pharmacy*   Lab Work: Your physician recommends that you return for lab work in: Today for CMP, Fasting Lipid Panel and CBC  If you have labs (blood work) drawn today and your tests are completely normal, you will receive your results only by: MyChart Message (if you have MyChart) OR A paper copy in the mail If you have any lab test that is abnormal or we need to change your treatment, we will call you to review the results.   Testing/Procedures: Please arrive at the [facility] main entrance of [facility] at xx:xx AM (30-45 minutes prior to test start time)  [facility] [address] [place], [place] [address] [phone]  Proceed to the [facility] Radiology Department (First Floor).  Please follow these instructions carefully (unless otherwise directed):  Hold all erectile dysfunction medications at least 48 hours prior to test.  On the Night Before the Test: Drink plenty of water. Do not consume any caffeinated/decaffeinated beverages or chocolate 12 hours prior to your test. Do not take any antihistamines 12 hours prior to your test.  On the Day of the Test: Drink plenty of water. Do not drink any water within one hour of the test. Do not eat any food 4 hours prior to the test. You may take your regular medications prior to the test. IF NOT ON A BETA BLOCKER - Take your Carvedilol 2 hours before the test.  After the Test: Drink plenty of water. After receiving IV contrast, you may experience a mild flushed feeling. This is normal. On occasion, you may experience a mild rash up to 24 hours after the test. This is not dangerous. If this occurs, you can take Benadryl 25 mg and  increase your fluid intake. If you experience trouble breathing, this can be serious. If it is severe call 911 IMMEDIATELY. If it is mild, please call our office. If you take any of these medications: Glipizide/Metformin, Avandament, Glucavance, please do not take 48 hours after completing test.    Follow-Up: At [facility], you and your health needs are our priority.  As part of our continuing mission to provide you with exceptional heart care, we have created designated Provider Care Teams.  These Care Teams include your primary Cardiologist (physician) and Advanced Practice Providers (APPs -  Physician Assistants and Nurse Practitioners) who all work together to provide you with the care you need, when you need it.  We recommend signing up for the patient portal called "MyChart".  Sign up information is provided on this After Visit Summary.  MyChart is used to connect with patients for Virtual Visits (Telemedicine).  Patients are able to view lab/test results, encounter notes, upcoming appointments, etc.  Non-urgent messages can be sent to your provider as well.   To learn more about what you can do with MyChart, go to [email or web address].    Your next appointment:  Depending on test results    Provider:   Wallis Bamberg, NP Rosalita Levan)    Other Instructions  [facility], [phone] [address], [place], [place] [address]

## 2023-04-21 LAB — CBC WITH DIFFERENTIAL/PLATELET
Basophils Absolute: 0.1 x10E3/uL (ref 0.0–0.2)
Basos: 1 %
EOS (ABSOLUTE): 0.1 x10E3/uL (ref 0.0–0.4)
Eos: 2 %
Hematocrit: 41.1 % (ref 37.5–51.0)
Hemoglobin: 13.6 g/dL (ref 13.0–17.7)
Immature Grans (Abs): 0 x10E3/uL (ref 0.0–0.1)
Immature Granulocytes: 0 %
Lymphocytes Absolute: 2.7 x10E3/uL (ref 0.7–3.1)
Lymphs: 37 %
MCH: 32.5 pg (ref 26.6–33.0)
MCHC: 33.1 g/dL (ref 31.5–35.7)
MCV: 98 fL — ABNORMAL HIGH (ref 79–97)
Monocytes Absolute: 0.8 x10E3/uL (ref 0.1–0.9)
Monocytes: 11 %
Neutrophils Absolute: 3.7 x10E3/uL (ref 1.4–7.0)
Neutrophils: 49 %
Platelets: 179 x10E3/uL (ref 150–450)
RBC: 4.19 x10E6/uL (ref 4.14–5.80)
RDW: 12.8 % (ref 11.6–15.4)
WBC: 7.3 x10E3/uL (ref 3.4–10.8)

## 2023-04-21 LAB — COMPREHENSIVE METABOLIC PANEL WITH GFR
ALT: 27 IU/L (ref 0–44)
AST: 39 IU/L (ref 0–40)
Albumin: 4.2 g/dL (ref 3.9–4.9)
Alkaline Phosphatase: 109 IU/L (ref 44–121)
BUN/Creatinine Ratio: 9 — ABNORMAL LOW (ref 10–24)
BUN: 8 mg/dL (ref 8–27)
Bilirubin Total: 0.5 mg/dL (ref 0.0–1.2)
CO2: 30 mmol/L — ABNORMAL HIGH (ref 20–29)
Calcium: 9.7 mg/dL (ref 8.6–10.2)
Chloride: 98 mmol/L (ref 96–106)
Creatinine, Ser: 0.93 mg/dL (ref 0.76–1.27)
Globulin, Total: 3.2 g/dL (ref 1.5–4.5)
Glucose: 67 mg/dL — ABNORMAL LOW (ref 70–99)
Potassium: 4.5 mmol/L (ref 3.5–5.2)
Sodium: 138 mmol/L (ref 134–144)
Total Protein: 7.4 g/dL (ref 6.0–8.5)
eGFR: 89 mL/min/1.73

## 2023-04-21 LAB — LIPID PANEL
Chol/HDL Ratio: 2.6 ratio (ref 0.0–5.0)
Cholesterol, Total: 103 mg/dL (ref 100–199)
HDL: 40 mg/dL
LDL Chol Calc (NIH): 48 mg/dL (ref 0–99)
Triglycerides: 70 mg/dL (ref 0–149)
VLDL Cholesterol Cal: 15 mg/dL (ref 5–40)

## 2023-04-23 ENCOUNTER — Telehealth: Payer: Self-pay | Admitting: *Deleted

## 2023-04-23 NOTE — Telephone Encounter (Signed)
Informed pt of lab results. Pt verbalized understanding and had no further questions.

## 2023-04-23 NOTE — Telephone Encounter (Signed)
-----   Message from Flossie Dibble sent at 04/21/2023  6:05 PM EDT ----- CBC looks good, no anemia or infection  BMET looks good, electrolytes and kidneys are good.  Cholesterol is excellent.  Continue current meds.

## 2023-04-30 ENCOUNTER — Telehealth: Payer: Self-pay | Admitting: Cardiology

## 2023-04-30 DIAGNOSIS — R072 Precordial pain: Secondary | ICD-10-CM

## 2023-04-30 NOTE — Telephone Encounter (Signed)
Patient wants to have CT Morphology test scheduled at [facility] instead of [facility].  Patient wants a call back to discuss getting paperwork to have test done.

## 2023-05-05 ENCOUNTER — Ambulatory Visit (HOSPITAL_COMMUNITY): Payer: 59

## 2023-05-06 NOTE — Telephone Encounter (Signed)
Changed order in system to reflect pt wants to go to [facility] for Cardiac CT rather than [facility]. Will resubmit to  precert and fax order to [facility]

## 2023-05-06 NOTE — Telephone Encounter (Signed)
Patient is following up requesting updates on switching location for CT.

## 2023-05-12 ENCOUNTER — Telehealth: Payer: Self-pay | Admitting: Cardiology

## 2023-05-12 NOTE — Telephone Encounter (Signed)
Patient is returning call to reschedule his CT Cardiac Morph.

## 2023-05-28 ENCOUNTER — Telehealth: Payer: Self-pay | Admitting: Cardiology

## 2023-05-28 DIAGNOSIS — I10 Essential (primary) hypertension: Secondary | ICD-10-CM

## 2023-05-28 DIAGNOSIS — Z01812 Encounter for preprocedural laboratory examination: Secondary | ICD-10-CM

## 2023-05-28 DIAGNOSIS — R072 Precordial pain: Secondary | ICD-10-CM

## 2023-05-28 NOTE — Telephone Encounter (Signed)
Pt states he needs to speak with Wallis Bamberg to explain why he can not go to [facility]. Please advise

## 2023-05-31 NOTE — Telephone Encounter (Signed)
Spoke with pt about Cardiac CT to be done at [facility]. He needs to come in for another Basic Metabolic Panel since it has been 30 days since his last one. Pt stated will come on 12/3 to have labs done. Pt verbalized understanding and had no further questions.

## 2023-06-16 LAB — BASIC METABOLIC PANEL WITH GFR
BUN/Creatinine Ratio: 4 — ABNORMAL LOW (ref 10–24)
BUN: 4 mg/dL — ABNORMAL LOW (ref 8–27)
CO2: 26 mmol/L (ref 20–29)
Calcium: 8.8 mg/dL (ref 8.6–10.2)
Chloride: 100 mmol/L (ref 96–106)
Creatinine, Ser: 0.92 mg/dL (ref 0.76–1.27)
Glucose: 111 mg/dL — ABNORMAL HIGH (ref 70–99)
Potassium: 4.2 mmol/L (ref 3.5–5.2)
Sodium: 138 mmol/L (ref 134–144)
eGFR: 91 mL/min/1.73

## 2023-06-18 ENCOUNTER — Telehealth: Payer: Self-pay | Admitting: Emergency Medicine

## 2023-06-18 NOTE — Telephone Encounter (Signed)
Results reviewed with pt as per Wallis Bamberg NP's note.  Pt verbalized understanding and had no additional questions. Routed to PCP.

## 2023-06-18 NOTE — Telephone Encounter (Signed)
-----   Message from Flossie Dibble sent at 06/18/2023 11:43 AM EST ----- Electrolytes and kidney function look good. Continue current meds.

## 2023-06-21 ENCOUNTER — Telehealth: Payer: Self-pay | Admitting: *Deleted

## 2023-06-21 NOTE — Telephone Encounter (Signed)
-----   Message from Nurse Emmit Alexanders sent at 06/21/2023  2:17 PM EST ----- [facility] called and wanted to know if pt was supposed to take any meds before CT. I saw he was supposed to take Coreg 2 hours before. Will you call him and make sure he knows. She said he told her was not supposed to take any meds. Thank you

## 2023-06-21 NOTE — Telephone Encounter (Signed)
Reminded pt he needs to take his Carvedilol 2 hours before CT. Pt verbalized understanding and had no further questions.

## 2023-07-15 ENCOUNTER — Encounter: Payer: Self-pay | Admitting: Cardiology

## 2023-07-19 ENCOUNTER — Telehealth: Payer: Self-pay | Admitting: Emergency Medicine

## 2023-07-19 ENCOUNTER — Telehealth: Payer: Self-pay | Admitting: Cardiology

## 2023-07-19 DIAGNOSIS — I7090 Unspecified atherosclerosis: Secondary | ICD-10-CM

## 2023-07-19 NOTE — Telephone Encounter (Signed)
 Patient called and said that he would like for someone to go over the results over with him again because he didn't get some of it

## 2023-07-19 NOTE — Telephone Encounter (Signed)
 Spoke with the patient, went over result again with him and simplified to better understanding which he verbalized understating. He is aware of plan and want to have Delon prescribe anything that will help quit smoking. I told him I will send message to [facility]. Patient will also want an update on the further testing recommended n=by Delon even if no approved by insurance. I sen message to our front desk staff to ensure patient is informed of this.

## 2023-07-19 NOTE — Telephone Encounter (Addendum)
 Results reviewed with pt as per Delon Hoover NP's note.   Reviewed jennifer's recommendation to have an Aorta/illiac duplex. Patient was agreeable with having the ultrasound. Pt verbalized understanding and had no additional questions. Reviewed instructions with patient.  Your physician has requested that you have an Aorta/Iliac Duplex.   No food after 11PM the night before.  Water is OK. (Don't drink liquids if you have been instructed not to for ANOTHER test) Avoid foods that produce bowel gas, for 24 hours prior to exam (see below). No breakfast, no chewing gum, no smoking or carbonated beverages. Patient may take morning medications with water. Come in for test at least 15 minutes early to register.

## 2023-07-19 NOTE — Addendum Note (Signed)
 Addended by: Lonia Farber on: 07/19/2023 09:00 AM   Modules accepted: Orders

## 2023-07-19 NOTE — Telephone Encounter (Addendum)
 Clarification Jimmy Footman recommendation is an aorta/iliac duplex at this time.

## 2023-07-20 ENCOUNTER — Telehealth: Payer: Self-pay

## 2023-07-20 MED ORDER — VARENICLINE TARTRATE 0.5 MG PO TABS: ORAL_TABLET | ORAL | 0 refills | Status: DC

## 2023-07-20 NOTE — Telephone Encounter (Signed)
-----   Message from Delon JAYSON Hoover sent at 07/19/2023  4:08 PM EST ----- Regarding: RE: Call follow up Start chantix   0.5 mg once daily on day 1-3  0.5 mg twice daily day 4-7 1 mg twice daily days 8 - 77   Try to stop smoking on day 8.   Total treatment 11 weeks. ----- Message ----- From: Amelia Burgard M, CMA Sent: 07/19/2023   3:51 PM EST To: Delon JAYSON Hoover, NP Subject: Call follow up                                 Per patient is requesting medication to quit smoking. He states patches don't help. Please advise

## 2023-07-20 NOTE — Telephone Encounter (Signed)
 Spoke with patient advised the following per Wallis Bamberg and verbalized understanding. Medication sent to confirmed pharmacy.

## 2023-08-03 ENCOUNTER — Ambulatory Visit: Payer: 59 | Attending: Cardiology

## 2023-08-03 DIAGNOSIS — I7090 Unspecified atherosclerosis: Secondary | ICD-10-CM | POA: Diagnosis not present

## 2023-08-07 DEATH — deceased
